# Patient Record
Sex: Female | Born: 1945 | Race: White | Hispanic: No | Marital: Married | Smoking: Never smoker
Health system: Southern US, Community
[De-identification: ages and names within clinical notes are randomized; demographics above are authoritative.]

## PROBLEM LIST (undated history)

## (undated) DIAGNOSIS — Z973 Presence of spectacles and contact lenses: Secondary | ICD-10-CM

## (undated) DIAGNOSIS — H9190 Unspecified hearing loss, unspecified ear: Secondary | ICD-10-CM

## (undated) DIAGNOSIS — M1990 Unspecified osteoarthritis, unspecified site: Secondary | ICD-10-CM

## (undated) DIAGNOSIS — M8580 Other specified disorders of bone density and structure, unspecified site: Secondary | ICD-10-CM

## (undated) DIAGNOSIS — Z974 Presence of external hearing-aid: Secondary | ICD-10-CM

## (undated) DIAGNOSIS — K219 Gastro-esophageal reflux disease without esophagitis: Secondary | ICD-10-CM

## (undated) DIAGNOSIS — E039 Hypothyroidism, unspecified: Secondary | ICD-10-CM

## (undated) DIAGNOSIS — Z8719 Personal history of other diseases of the digestive system: Secondary | ICD-10-CM

## (undated) DIAGNOSIS — C801 Malignant (primary) neoplasm, unspecified: Secondary | ICD-10-CM

## (undated) DIAGNOSIS — E785 Hyperlipidemia, unspecified: Secondary | ICD-10-CM

## (undated) DIAGNOSIS — Z780 Asymptomatic menopausal state: Secondary | ICD-10-CM

## (undated) DIAGNOSIS — J189 Pneumonia, unspecified organism: Secondary | ICD-10-CM

## (undated) DIAGNOSIS — T7840XA Allergy, unspecified, initial encounter: Secondary | ICD-10-CM

## (undated) DIAGNOSIS — N814 Uterovaginal prolapse, unspecified: Secondary | ICD-10-CM

## (undated) DIAGNOSIS — T148XXA Other injury of unspecified body region, initial encounter: Secondary | ICD-10-CM

## (undated) HISTORY — DX: Uterovaginal prolapse, unspecified: N81.4

## (undated) HISTORY — DX: Allergy, unspecified, initial encounter: T78.40XA

## (undated) HISTORY — PX: CATARACT EXTRACTION: SUR2

## (undated) HISTORY — DX: Unspecified osteoarthritis, unspecified site: M19.90

## (undated) HISTORY — DX: Malignant (primary) neoplasm, unspecified: C80.1

## (undated) HISTORY — DX: Hyperlipidemia, unspecified: E78.5

## (undated) HISTORY — PX: HEMORRHOID SURGERY: SHX153

---

## 1998-11-24 ENCOUNTER — Other Ambulatory Visit: Admission: RE | Admit: 1998-11-24 | Discharge: 1998-11-24 | Payer: Self-pay | Admitting: Obstetrics & Gynecology

## 2000-02-07 ENCOUNTER — Other Ambulatory Visit: Admission: RE | Admit: 2000-02-07 | Discharge: 2000-02-07 | Payer: Self-pay | Admitting: Obstetrics and Gynecology

## 2000-02-10 ENCOUNTER — Ambulatory Visit (HOSPITAL_COMMUNITY): Admission: RE | Admit: 2000-02-10 | Discharge: 2000-02-10 | Payer: Self-pay | Admitting: Obstetrics and Gynecology

## 2000-02-10 ENCOUNTER — Encounter: Payer: Self-pay | Admitting: Obstetrics and Gynecology

## 2001-05-03 ENCOUNTER — Other Ambulatory Visit: Admission: RE | Admit: 2001-05-03 | Discharge: 2001-05-03 | Payer: Self-pay | Admitting: Obstetrics and Gynecology

## 2002-03-12 ENCOUNTER — Ambulatory Visit (HOSPITAL_COMMUNITY): Admission: RE | Admit: 2002-03-12 | Discharge: 2002-03-12 | Payer: Self-pay | Admitting: Family Medicine

## 2002-11-29 ENCOUNTER — Other Ambulatory Visit: Admission: RE | Admit: 2002-11-29 | Discharge: 2002-11-29 | Payer: Self-pay | Admitting: Obstetrics and Gynecology

## 2005-06-29 ENCOUNTER — Other Ambulatory Visit: Admission: RE | Admit: 2005-06-29 | Discharge: 2005-06-29 | Payer: Self-pay | Admitting: Obstetrics and Gynecology

## 2010-01-07 ENCOUNTER — Encounter (INDEPENDENT_AMBULATORY_CARE_PROVIDER_SITE_OTHER): Payer: Self-pay | Admitting: *Deleted

## 2010-01-20 ENCOUNTER — Telehealth: Payer: Self-pay | Admitting: Internal Medicine

## 2010-01-21 ENCOUNTER — Ambulatory Visit: Payer: Self-pay | Admitting: Internal Medicine

## 2010-01-21 DIAGNOSIS — M2550 Pain in unspecified joint: Secondary | ICD-10-CM

## 2010-01-21 DIAGNOSIS — E785 Hyperlipidemia, unspecified: Secondary | ICD-10-CM | POA: Insufficient documentation

## 2010-01-21 DIAGNOSIS — Z85828 Personal history of other malignant neoplasm of skin: Secondary | ICD-10-CM

## 2010-01-21 DIAGNOSIS — K5730 Diverticulosis of large intestine without perforation or abscess without bleeding: Secondary | ICD-10-CM

## 2010-01-21 DIAGNOSIS — M4716 Other spondylosis with myelopathy, lumbar region: Secondary | ICD-10-CM

## 2010-01-29 ENCOUNTER — Ambulatory Visit: Payer: Self-pay | Admitting: Internal Medicine

## 2010-02-22 ENCOUNTER — Ambulatory Visit: Payer: Self-pay | Admitting: Internal Medicine

## 2010-02-22 DIAGNOSIS — G4710 Hypersomnia, unspecified: Secondary | ICD-10-CM | POA: Insufficient documentation

## 2010-02-22 LAB — CONVERTED CEMR LAB
Cholesterol, target level: 200 mg/dL
HDL goal, serum: 40 mg/dL
LDL Goal: 160 mg/dL

## 2010-04-13 ENCOUNTER — Telehealth (INDEPENDENT_AMBULATORY_CARE_PROVIDER_SITE_OTHER): Payer: Self-pay | Admitting: *Deleted

## 2010-05-11 ENCOUNTER — Telehealth: Payer: Self-pay | Admitting: Internal Medicine

## 2010-05-20 ENCOUNTER — Ambulatory Visit: Payer: Self-pay | Admitting: Internal Medicine

## 2010-05-21 LAB — CONVERTED CEMR LAB
ALT: 20 units/L (ref 0–35)
AST: 22 units/L (ref 0–37)
Albumin: 4 g/dL (ref 3.5–5.2)
Alkaline Phosphatase: 49 units/L (ref 39–117)
Bilirubin, Direct: 0.1 mg/dL (ref 0.0–0.3)
Cholesterol: 190 mg/dL (ref 0–200)
HDL: 48.7 mg/dL (ref >39.00)
LDL Cholesterol: 112 mg/dL — ABNORMAL HIGH (ref 0–99)
Total Bilirubin: 0.5 mg/dL (ref 0.3–1.2)
Total CHOL/HDL Ratio: 4
Total CK: 86 units/L (ref 7–177)
Total Protein: 6.5 g/dL (ref 6.0–8.3)
Triglycerides: 147 mg/dL (ref 0.0–149.0)
VLDL: 29.4 mg/dL (ref 0.0–40.0)

## 2010-05-26 ENCOUNTER — Ambulatory Visit: Payer: Self-pay | Admitting: Internal Medicine

## 2010-05-26 DIAGNOSIS — M8580 Other specified disorders of bone density and structure, unspecified site: Secondary | ICD-10-CM

## 2010-05-26 DIAGNOSIS — IMO0001 Reserved for inherently not codable concepts without codable children: Secondary | ICD-10-CM

## 2010-05-28 ENCOUNTER — Encounter: Payer: Self-pay | Admitting: Internal Medicine

## 2010-05-28 LAB — CONVERTED CEMR LAB: Vit D, 25-Hydroxy: 38 ng/mL (ref 30–89)

## 2010-09-05 HISTORY — PX: COLONOSCOPY: SHX174

## 2010-09-10 ENCOUNTER — Other Ambulatory Visit: Payer: Self-pay | Admitting: Internal Medicine

## 2010-09-10 ENCOUNTER — Ambulatory Visit
Admission: RE | Admit: 2010-09-10 | Discharge: 2010-09-10 | Payer: Self-pay | Source: Home / Self Care | Attending: Internal Medicine | Admitting: Internal Medicine

## 2010-09-10 LAB — LIPID PANEL
Cholesterol: 195 mg/dL (ref 0–200)
HDL: 56 mg/dL (ref >39.00)
LDL Cholesterol: 127 mg/dL — ABNORMAL HIGH (ref 0–99)
Total CHOL/HDL Ratio: 3
Triglycerides: 62 mg/dL (ref 0.0–149.0)
VLDL: 12.4 mg/dL (ref 0.0–40.0)

## 2010-09-10 LAB — HEPATIC FUNCTION PANEL
ALT: 20 U/L (ref 0–35)
AST: 22 U/L (ref 0–37)
Albumin: 3.7 g/dL (ref 3.5–5.2)
Alkaline Phosphatase: 49 U/L (ref 39–117)
Bilirubin, Direct: 0.1 mg/dL (ref 0.0–0.3)
Total Bilirubin: 0.8 mg/dL (ref 0.3–1.2)
Total Protein: 6.3 g/dL (ref 6.0–8.3)

## 2010-09-10 LAB — CK: Total CK: 85 U/L (ref 7–177)

## 2010-10-03 LAB — CONVERTED CEMR LAB
ALT: 15 units/L (ref 0–35)
AST: 20 units/L (ref 0–37)
Albumin: 4.2 g/dL (ref 3.5–5.2)
Alkaline Phosphatase: 55 units/L (ref 39–117)
BUN: 19 mg/dL (ref 6–23)
Basophils Absolute: 0 10*3/uL (ref 0.0–0.1)
Basophils Absolute: 0 10*3/uL (ref 0.0–0.1)
Basophils Relative: 0.6 % (ref 0.0–3.0)
Basophils Relative: 0.7 % (ref 0.0–3.0)
Bilirubin, Direct: 0.1 mg/dL (ref 0.0–0.3)
CO2: 30 meq/L (ref 19–32)
Calcium: 9.2 mg/dL (ref 8.4–10.5)
Chloride: 104 meq/L (ref 96–112)
Cholesterol: 215 mg/dL — ABNORMAL HIGH (ref 0–200)
Creatinine, Ser: 0.6 mg/dL (ref 0.4–1.2)
Direct LDL: 152.3 mg/dL
Eosinophils Absolute: 0.1 10*3/uL (ref 0.0–0.7)
Eosinophils Absolute: 0.2 10*3/uL (ref 0.0–0.7)
Eosinophils Relative: 2.3 % (ref 0.0–5.0)
Eosinophils Relative: 4 % (ref 0.0–5.0)
GFR calc non Af Amer: 108.71 mL/min (ref >60)
Glucose, Bld: 95 mg/dL (ref 70–99)
HCT: 37.3 % (ref 36.0–46.0)
HCT: 37.9 % (ref 36.0–46.0)
HDL: 56.4 mg/dL (ref >39.00)
Hemoglobin: 12.8 g/dL (ref 12.0–15.0)
Hemoglobin: 12.8 g/dL (ref 12.0–15.0)
Lymphocytes Relative: 32.4 % (ref 12.0–46.0)
Lymphocytes Relative: 69.6 % — ABNORMAL HIGH (ref 12.0–46.0)
Lymphs Abs: 1.3 10*3/uL (ref 0.7–4.0)
Lymphs Abs: 1.6 10*3/uL (ref 0.7–4.0)
MCHC: 33.7 g/dL (ref 30.0–36.0)
MCHC: 34.2 g/dL (ref 30.0–36.0)
MCV: 92.1 fL (ref 78.0–100.0)
MCV: 92.4 fL (ref 78.0–100.0)
Monocytes Absolute: 0.2 10*3/uL (ref 0.1–1.0)
Monocytes Absolute: 0.3 10*3/uL (ref 0.1–1.0)
Monocytes Relative: 7.1 % (ref 3.0–12.0)
Monocytes Relative: 7.2 % (ref 3.0–12.0)
Neutro Abs: 0.5 10*3/uL — ABNORMAL LOW (ref 1.4–7.7)
Neutro Abs: 2.3 10*3/uL (ref 1.4–7.7)
Neutrophils Relative %: 20.2 % — ABNORMAL LOW (ref 43.0–77.0)
Neutrophils Relative %: 55.9 % (ref 43.0–77.0)
Platelets: 207 10*3/uL (ref 150.0–400.0)
Platelets: 215 10*3/uL (ref 150.0–400.0)
Potassium: 4.5 meq/L (ref 3.5–5.1)
RBC: 4.05 M/uL (ref 3.87–5.11)
RBC: 4.11 M/uL (ref 3.87–5.11)
RDW: 13.4 % (ref 11.5–14.6)
RDW: 13.6 % (ref 11.5–14.6)
Rheumatoid fact SerPl-aCnc: 21.3 intl units/mL — ABNORMAL HIGH (ref 0.0–20.0)
Sed Rate: 9 mm/hr (ref 0–22)
Sodium: 141 meq/L (ref 135–145)
TSH: 2.1 microintl units/mL (ref 0.35–5.50)
Total Bilirubin: 0.7 mg/dL (ref 0.3–1.2)
Total CHOL/HDL Ratio: 4
Total Protein: 6.9 g/dL (ref 6.0–8.3)
Triglycerides: 146 mg/dL (ref 0.0–149.0)
VLDL: 29.2 mg/dL (ref 0.0–40.0)
WBC: 2.3 10*3/uL — ABNORMAL LOW (ref 4.5–10.5)
WBC: 4.1 10*3/uL — ABNORMAL LOW (ref 4.5–10.5)

## 2010-10-04 ENCOUNTER — Ambulatory Visit
Admission: RE | Admit: 2010-10-04 | Discharge: 2010-10-04 | Payer: Self-pay | Source: Home / Self Care | Attending: Internal Medicine | Admitting: Internal Medicine

## 2010-10-05 NOTE — Letter (Signed)
Summary: New Patient Letter  [facility]  [address] [place], [place] [address]   Phone: [phone]  Fax: [phone]       01/07/2010 MRN: [id number]  [facility] [address] [place], [place]  [address]  Dear Ms. Berk,   Welcome to [facility] and thank you for choosing [place] as your Primary Care Providers. Enclosed you will find information about our practice that we hope you find helpful. We have also enclosed forms to be filled out prior to your visit. This will provide [place] with the necessary information and facilitate your being seen in a timely manner. If you have any questions, please call us at: [phone] and we will be happy to assist you. We look forward to seeing you at your scheduled appointment time.  Appointment Thursday, Jan 21, 2010 at 1:00pm  with Dr. Marga Melnick   Sincerely,  Primary Health Care Team  Please arrive 15 minutes early for your first appointment and bring your insurance card. Co-pay is required at the time of your visit.  *****Please call the office if you are not able to keep this appointment. There is a charge of $50.00 if any appointment is not cancelled or rescheduled within 24 hours*****

## 2010-10-05 NOTE — Assessment & Plan Note (Signed)
Summary: NEW TO EST,CHOLESTEROL,MEDICARE,OK PER Audrey [name]/RH.....   Vital Signs:  Patient profile:   65 year old female Height:      63.5 inches Weight:      146.8 pounds BMI:     25.69 Pulse rate:   56 / minute Resp:     14 per minute BP sitting:   100 / 80  (left arm) Cuff size:   large  Vitals Entered By: Kandice Hams (Jan 21, 2010 1:13 PM) CC: New patient establish: Arthritis Comments       CC:  New patient establish: Arthritis.  History of Present Illness: Audrey [name] is here to establish as a new patient; she has  almost constant, diffuse joint pain for 3 years. Previous Rx has been  Diclofenac with some response. She has never had a physical; she has gone on Medicare as of 12/2009. IAPP criteria reviewed ; all issues addressed  & up to date except for colonoscopy & Health Care POA designation. No environmental or social/ familial risks identified. No physical limitations except related to  joint symptoms.  Preventive Screening-Counseling & Management  Alcohol-Tobacco     Smoking Status: never  Caffeine-Diet-Exercise     Does Patient Exercise: no  Allergies (verified): 1)  ! Celebrex 2)  ! Sulfa  Past History:  Past Medical History: Arthralgias Hyperlipidemia Excess somnulence , controlled  Diverticulosis, colon Skin cancer, hx of, Basal Cell   Past Surgical History: G 2 P 2  Hemorrhoidectomy Colonoscopy , Dr Jarold Motto  Family History: Father: CAD, COPD Mother: DAD, Macular  Degeneration Siblings: bro: CAD , S/P CBAG No FH  RA, SLE, scleroderma  Social History: Retired Married Never Smoked Alcohol use-yes: rare Regular exercise-no Smoking Status:  never Does Patient Exercise:  no  Review of Systems  The patient denies anorexia, fever, weight loss, weight gain, vision loss, decreased hearing, hoarseness, chest pain, syncope, dyspnea on exertion, peripheral edema, prolonged cough, headaches, hemoptysis, abdominal pain, melena, hematochezia, severe  indigestion/heartburn, unusual weight change, abnormal bleeding, enlarged lymph nodes, and angioedema.   GU:  Denies discharge, dysuria, hematuria, and incontinence. MS:  Complains of stiffness; denies joint redness, joint swelling, and muscle weakness; Burning pain in shoulders, hands, LS area, hips, knees & ankles . Stiffness esp in am. Derm:  Denies changes in nail beds, dryness, hair loss, lesion(s), and rash. Neuro:  Denies brief paralysis, disturbances in coordination, memory loss, numbness, poor balance, tingling, and weakness; Some LLE radicular pain post sitting or reclining. Psych:  Denies anxiety, depression, easily angered, easily tearful, irritability, and panic attacks.  Physical Exam  General:  well-nourished,in no acute distress; alert,appropriate and cooperative throughout examination Head:  Normocephalic and atraumatic without obvious abnormalities. Eyes:  No corneal or conjunctival inflammation noted.Perrla. Funduscopic exam benign, without hemorrhages, exudates or papilledema. Ears:  External ear exam shows no significant lesions or deformities.  Otoscopic examination reveals clear canals, tympanic membranes are intact bilaterally without bulging, retraction, inflammation or discharge. Hearing is grossly normal bilaterally. Nose:  External nasal examination shows no deformity or inflammation. Nasal mucosa are pink and moist without lesions or exudates. Mouth:  Oral mucosa and oropharynx without lesions or exudates.  Teeth in good repair. Neck:  No deformities, masses, or tenderness noted. Breasts:  Dr Senaida Ores  Lungs:  Normal respiratory effort, chest expands symmetrically. Lungs are clear to auscultation, no crackles or wheezes. Heart:  Normal rate and regular rhythm. S1 and S2 normal without gallop, murmur, click, rub or other extra sounds. Abdomen:  Bowel sounds positive,abdomen soft  and non-tender without masses, organomegaly or hernias noted. Aorta palpable w/o  AAA Genitalia:  Dr Senaida Ores Msk:  No deformity or scoliosis noted of thoracic or lumbar spine.  Sat up w/o help Pulses:  R and L carotid,radial,dorsalis pedis and posterior tibial pulses are full and equal bilaterally Extremities:  No clubbing, cyanosis, edema, or deformity noted with normal full range of motion of all joints.   Surprisingly neg exam. Neg SLR . Neurologic:  alert & oriented X3, strength normal in all extremities, gait normal, and DTRs symmetrical and normal.   Skin:  Intact without suspicious lesions or rashes Cervical Nodes:  No lymphadenopathy noted Axillary Nodes:  No palpable lymphadenopathy Psych:  memory intact for recent and remote, normally interactive, good eye contact, not anxious appearing, and not depressed appearing.     Impression & Recommendations:  Problem # 1:  ROUTINE GENERAL MEDICAL EXAM@HEALTH  CARE FACL (ICD-V70.0) "IAPP" Orders: EKG w/ Interpretation (93000) Venipuncture (84132) TLB-Lipid Panel (80061-LIPID) TLB-BMP (Basic Metabolic Panel-BMET) (80048-METABOL) TLB-CBC Platelet - w/Differential (85025-CBCD) TLB-Hepatic/Liver Function Pnl (80076-HEPATIC) TLB-TSH (Thyroid Stimulating Hormone) (84443-TSH)  Problem # 2:  POLYARTHRITIS (ICD-719.49)  Orders: TLB-Rheumatoid Factor (RA) (44010-UV) TLB-Sedimentation Rate (ESR) (85652-ESR)  Problem # 3:  HYPERLIPIDEMIA (ICD-272.4)  Her updated medication list for this problem includes:    Simvastatin 10 Mg Tabs (Simvastatin) .Marland Kitchen... 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 2 by mouth once daily as needed  Complete Medication List: 1)  Fluoxetine Hcl 20 Mg Caps (Fluoxetine hcl) .Marland Kitchen.. 1 by mouth once daily 2)  Diclofenac Sodium 75 Mg Tbec (Diclofenac sodium) .... 2 by mouth once daily as  needed 3)  Simvastatin 10 Mg Tabs (Simvastatin) .Marland Kitchen.. 1 by mouth at bedtime 4)  Calcium 1500mg   .... Daily 5)  Icaps Caps (Multiple vitamins-minerals) .Marland Kitchen.. 1 by mouth once daily  Patient Instructions: 1)  Schedule a colonoscopy  to help detect colon cancer.Choose your Health care Power of Attorney and/or prepare a Living Will.

## 2010-10-05 NOTE — Assessment & Plan Note (Signed)
Summary: followup on labs//kn   Vital Signs:  Patient profile:   65 year old female Weight:      149.2 pounds Pulse rate:   64 / minute Resp:     16 per minute BP sitting:   104 / 68  (left arm) Cuff size:   regular  Vitals Entered By: Shonna Chock (February 22, 2010 3:07 PM) CC: Follow-up visit: Discuss Labs and refill Fluoxetine, Lipid Management Comments REVIEWED MED LIST, PATIENT AGREED DOSE AND INSTRUCTION CORRECT    CC:  Follow-up visit: Discuss Labs and refill Fluoxetine and Lipid Management.  History of Present Illness: Fluoxetine has controlled the hypersomnulence present for almost 20 years.The Rx costs ? $10 for 3 months' supply.  Lipid Management History:      Positive NCEP/ATP III risk factors include female age 80 years old or older.  Negative NCEP/ATP III risk factors include non-diabetic, no family history for ischemic heart disease, non-tobacco-user status, non-hypertensive, no ASHD (atherosclerotic heart disease), no prior stroke/TIA, no peripheral vascular disease, and no history of aortic aneurysm.     Allergies: 1)  ! Celebrex 2)  ! Sulfa  Past History:  Past Medical History: Arthralgias Hyperlipidemia: NMR Lipoprofile 2011: 99(1706/989), HDL 55, TG 91. LDL goal = <80, ideally < 70.. Framingham Study LDL goal = < 160. Excess somnulence , controlled  Diverticulosis, colon Skin cancer, hx of, Basal Cell   Review of Systems General:  Complains of fatigue; denies sleep disorder. Derm:  Complains of rash; Rash R elbow in Summer. Psych:  Denies anxiety, depression, easily angered, easily tearful, irritability, and panic attacks.  Physical Exam  General:  well-nourished; alert,appropriate and cooperative throughout examination Lungs:  Normal respiratory effort, chest expands symmetrically. Lungs are clear to auscultation, no crackles or wheezes. Heart:  Normal rate and regular rhythm. S1 and S2 normal without gallop, murmur, click, rub.S4 Pulses:  R and L  carotid,radial,dorsalis pedis and posterior tibial pulses are full and equal bilaterally Skin:  Faint splotchy rash R elbow Psych:  memory intact for recent and remote, normally interactive, good eye contact, not anxious appearing, and not depressed appearing.     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 1 by mouth once daily 2)  Diclofenac Sodium 75 Mg Tbec (Diclofenac sodium) .... 2 by mouth once daily as needed 3)  Simvastatin 20 Mg Tabs (simvastatin)  .Marland Kitchen.. 1 by mouth at bedtime 4)  Calcium 1500mg   .... Daily 5)  Icaps Caps (Multiple vitamins-minerals) .Marland Kitchen.. 1 by mouth once daily  Lipid Assessment/Plan:      Based on NCEP/ATP III, the patient's risk factor category is "0-1 risk factors".  The patient's lipid goals are as follows: Total cholesterol goal is 200; LDL cholesterol goal is 160; HDL cholesterol goal is 40; Triglyceride goal is 150.    Patient Instructions: 1)  Cort Aid two times a day to rash.Please schedule a follow-up appointment in 1 year or as needed. Prescriptions: SIMVASTATIN 20 MG TABS (SIMVASTATIN) 1 by mouth at bedtime  #90 x 3   Entered and Authorized by:   Marga Melnick MD   Signed by:   Marga Melnick MD on 02/22/2010   Method used:   Print then Give to Patient   RxID:   [id number] FLUOXETINE HCL 20 MG CAPS (FLUOXETINE HCL) 1 by  mouth once daily  #90 x 3   Entered and Authorized by:   Marga Melnick MD   Signed by:   Marga Melnick MD on 02/22/2010   Method used:   Print then Give to Patient   RxID:   [id number]

## 2010-10-05 NOTE — Progress Notes (Signed)
Summary: REFILL REQUEST  Phone Note Refill Request Call back at [phone] Message from:  Pharmacy on April 13, 2010 10:30 AM  Refills Requested: Medication #1:  SIMVASTATIN 20 MG TABS (SIMVASTATIN) 1 by mouth at bedtime   Dosage confirmed as above?Dosage Confirmed   Supply Requested: 3 months [facility]  Next Appointment Scheduled: NONE Initial call taken by: Lavell Islam,  April 13, 2010 10:30 AM    Prescriptions: SIMVASTATIN 20 MG TABS (SIMVASTATIN) 1 by mouth at bedtime  #90 x 3   Entered by:   Shonna Chock CMA   Authorized by:   Marga Melnick MD   Signed by:   Shonna Chock CMA on 04/13/2010   Method used:   Faxed to ...       [facility] (mail-order)             , [place]         Ph: [phone]       Fax: [phone]   RxID:   [id number]

## 2010-10-05 NOTE — Assessment & Plan Note (Signed)
Summary: FOLLOWUP ON LABS/KN   Vital Signs:  Patient profile:   65 year old female Weight:      149.2 pounds Temp:     98.5 degrees F oral Pulse rate:   64 / minute BP sitting:   110 / 70  (left arm) Cuff size:   regular  Vitals Entered By: Shonna Chock CMA (May 26, 2010 9:21 AM)  History of Present Illness: Hyperlipidemia Follow-Up      This is a 65 year old woman who presents for Hyperlipidemia follow-up.  The patient reports muscle aches  in U & LE bilaterally which she relates to Simvastatin. Her CPK was normal (86).She also has pain in RLE > LLE when driving especially. Data concerning statin induced myalgia was reviewed as was NMR Lipoprofile. She  denies GI upset, abdominal pain, flushing, itching, constipation, diarrhea, and fatigue.  The patient denies the following symptoms: chest pain/pressure, exercise intolerance, dypsnea, palpitations, and pedal edema.  Compliance with medications (by patient report) has been near 100%.  Dietary compliance has been fair.  The patient reports no exercise.  Her father had MI @ 81 & her mother @ 78.Her brother had CBAG @ 65.  Current Medications (verified): 1)  Fluoxetine Hcl 20 Mg Caps (Fluoxetine Hcl) .Marland Kitchen.. 1 By Mouth Once Daily 2)  Diclofenac Sodium 75 Mg Tbec (Diclofenac Sodium) .... 2 By Mouth Once Daily As Needed 3)  Simvastatin 20 Mg Tabs (Simvastatin) .Marland Kitchen.. 1 By Mouth At Bedtime 4)  Calcium 1500mg  .... Daily 5)  Icaps  Caps (Multiple Vitamins-Minerals) .Marland Kitchen.. 1 By Mouth Once Daily  Allergies: 1)  ! Celebrex 2)  ! Sulfa  Past History:  Past Medical History: Arthralgias Hyperlipidemia: NMR Lipoprofile 2011: 99(1706/989), HDL 55, TG 91. LDL goal = <80, ideally < 70. Framingham Study LDL goal = < 160. Excess somnulence , controlled  Diverticulosis, colon Skin cancer, hx of, Basal Cell  Osteopenia, PMH of ,S/P Fosamax  Review of Systems Neuro:  Denies brief paralysis, numbness, tingling, and weakness.  Physical  Exam  General:  in no acute distress; alert,appropriate and cooperative throughout examination Lungs:  Normal respiratory effort, chest expands symmetrically. Lungs are clear to auscultation, no crackles or wheezes. Heart:  Normal rate and regular rhythm. S1 and S2 normal without gallop, murmur, click, rub . Soft S4 with slurring Msk:  Lay down & sat up w/o help. Neg SLR Pulses:  R and L carotid,radial,dorsalis pedis and posterior tibial pulses are full and equal bilaterally Extremities:  No clubbing, cyanosis, edema.  Neurologic:  alert & oriented X3, strength normal in all extremities, and DTRs symmetrical and normal.   Skin:  Intact without suspicious lesions or rashes Psych:  memory intact for recent and remote, normally interactive, and good eye contact.     Impression & Recommendations:  Problem # 1:  MUSCLE PAIN (ICD-729.1) CPK normal Her updated medication list for this problem includes:    Diclofenac Sodium 75 Mg Tbec (Diclofenac sodium) .Marland Kitchen... 2 by mouth once daily as needed  Problem # 2:  OSTEOPENIA (ICD-733.90)  PMH of  Orders: T-Vitamin D (25-Hydroxy) ([address]38-75643) Misc. Referral (Misc. 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 2 by mouth once daily as needed  Problem # 4:  HYPERLIPIDEMIA (ICD-272.4)  Her updated medication list for this problem includes:    Pravastatin Sodium 40 Mg Tabs (Pravastatin sodium) .Marland KitchenMarland KitchenMarland KitchenMarland Kitchen  1 at bedtime  Complete Medication List: 1)  Fluoxetine Hcl 20 Mg Caps (Fluoxetine hcl) .Marland Kitchen.. 1 by mouth once daily 2)  Diclofenac Sodium 75 Mg Tbec (Diclofenac sodium) .... 2 by mouth once daily as needed 3)  Calcium 1500mg   .... Daily 4)  Icaps Caps (Multiple vitamins-minerals) .Marland Kitchen.. 1 by mouth once daily 5)  Pravastatin Sodium 40 Mg Tabs (Pravastatin sodium) .Marland Kitchen.. 1 at bedtime  Patient Instructions: 1)   Stretching exercises recommended for back & leg pain.Fasting labs in 10 weeks if Pravastatin taken: 2)  Hepatic Panel ; CPK : 995.20,729.1 3)  Lipid Panel :272.4 Prescriptions: PRAVASTATIN SODIUM 40 MG TABS (PRAVASTATIN SODIUM) 1 at bedtime  #90 x 0   Entered and Authorized by:   Marga Melnick MD   Signed by:   Marga Melnick MD on 05/26/2010   Method used:   Historical   RxID:   [id number]   Appended Document: FOLLOWUP ON LABS/KN Flu Vaccine Consent Questions     Do you have a history of severe allergic reactions to this vaccine? no    Any prior history of allergic reactions to egg and/or gelatin? no    Do you have a sensitivity to the preservative Thimersol? no    Do you have a past history of Guillan-Barre Syndrome? no    Do you currently have an acute febrile illness? no    Have you ever had a severe reaction to latex? no    Vaccine information given and explained to patient? yes    Are you currently pregnant? no    Lot Number:[id number]   Exp Date:03/05/2011   Site Given  Right Deltoid IM

## 2010-10-05 NOTE — Progress Notes (Signed)
Summary: doctor switch request  Phone Note Call from Patient Call back at Home Phone [phone]   Caller: Patient Call For: Dr. Marina Goodell Reason for Call: Talk to Nurse Summary of Call: pt would like to switch to Dr. Jarold Motto because Dr. Jarold Motto uses the prep that she likes Initial call taken by: Vallarie Mare,  Jan 20, 2010 12:51 PM  Follow-up for Phone Call        Pt. thought Dr.Basma Buchner still did' the gallon jug" prep.Informed of the current prep as she got her recall letter  so she will call back to schedule. Follow-up by: Teryl Lucy RN,  Jan 21, 2010 4:04 PM

## 2010-10-05 NOTE — Progress Notes (Signed)
Summary: Triage: Medication Concerns  Phone Note Call from Patient   Caller: Patient Summary of Call: PT STATES THAT SHE IS ON SIMVASTATIN AND SHE BELIEVES IT IS CAUSING HER BODY TO ACHE AFTER RESEARCHING THE MEDICINE. cAN THIS BE CHANGED? PLEASE ADVISE [phone] Initial call taken by: Lavell Islam,  May 11, 2010 8:39 AM  Follow-up for Phone Call        Dr.Hopper please advise  Follow-up by: Shonna Chock CMA,  May 11, 2010 10:01 AM  Additional Follow-up for Phone Call Additional follow up Details #1::        Please continue med long enough to get lab tests :vit D level, fasting lipids, hepatic panel & CPK ( 272.4, 995.20, 729.1)  labs ASAP; F/U appt 2-3 days later to discuss risks & options Additional Follow-up by: Marga Melnick MD,  May 11, 2010 5:04 PM    Additional Follow-up for Phone Call Additional follow up Details #2::    pt aware, labs schedule...........Marland KitchenFelecia Deloach CMA  May 11, 2010 5:15 PM

## 2010-10-08 ENCOUNTER — Ambulatory Visit (INDEPENDENT_AMBULATORY_CARE_PROVIDER_SITE_OTHER)
Admission: RE | Admit: 2010-10-08 | Discharge: 2010-10-08 | Disposition: A | Discharge status: Home / Self Care | Payer: Self-pay | Source: Ambulatory Visit | Attending: Internal Medicine | Admitting: Internal Medicine

## 2010-10-08 ENCOUNTER — Other Ambulatory Visit: Payer: Self-pay | Admitting: Internal Medicine

## 2010-10-08 DIAGNOSIS — M549 Dorsalgia, unspecified: Secondary | ICD-10-CM

## 2010-10-13 NOTE — Assessment & Plan Note (Signed)
Summary: followup on labs/kn   Vital Signs:  Patient profile:   66 year old female Weight:      151.2 pounds BMI:     26.46 Pulse rate:   64 / minute Resp:     14 per minute BP sitting:   110 / 72  (left arm) Cuff size:   regular  Vitals Entered By: Shonna Chock CMA (October 04, 2010 9:23 AM) CC: Back pain   CC:  Back pain.  History of Present Illness: Hyperlipidemia Follow-Up      This is a 65 year old woman who presents for Hyperlipidemia follow-up.  Labs reviewed & risks discussed. The patient denies muscle aches ( see CPK  value &  Back Pain assessment ), GI upset, abdominal pain, flushing, itching, constipation, diarrhea, and fatigue.  The patient denies the following symptoms: chest pain/pressure, exercise intolerance, dypsnea, palpitations, syncope, and pedal edema.  Compliance with medications (by patient report) has been near 100%.  Dietary compliance has been fair.  The patient reports no exercise.   Back Pain      The patient also presents with Back pain intermittenty for 5-6  years.  The patient denies fever, chills, weakness, loss of sensation, fecal incontinence, urinary incontinence, and urinary retention.  The pain is located in the mid low back.  The pain  is exacerbated by standing, driving &  lifting.  The pain radiates to the right leg and left leg below the knee.  The pain is  also made worse by lying down ( "my legs ache"). and flexion.   Diclofenac helps.  Current Medications (verified): 1)  Fluoxetine Hcl 20 Mg Caps (Fluoxetine Hcl) .Marland Kitchen.. 1 By Mouth Once Daily 2)  Diclofenac Sodium 75 Mg Tbec (Diclofenac Sodium) .... 2 By Mouth Once Daily As Needed 3)  Calcium 1500mg  .... Daily 4)  Icaps  Caps (Multiple Vitamins-Minerals) .Marland Kitchen.. 1 By Mouth Once Daily 5)  Pravastatin Sodium 40 Mg Tabs (Pravastatin Sodium) .Marland Kitchen.. 1 At Bedtime  Allergies: 1)  ! Celebrex 2)  ! Sulfa  Physical Exam  General:  well-nourished,in no acute distress; alert,appropriate and cooperative  throughout examination Lungs:  Normal respiratory effort, chest expands symmetrically. Lungs are clear to auscultation, no crackles or wheezes. Heart:  Normal rate and regular rhythm. S1 and S2 normal without gallop, murmur, click, rub . Abdomen:  Bowel sounds positive,abdomen soft and non-tender without masses, organomegaly or hernias noted. No AAA or bruits Msk:  No deformity or scoliosis noted of thoracic or lumbar spine.   She lay down & sat up w/o help Pulses:  R and L carotid,radial,dorsalis pedis and posterior tibial pulses are full and equal bilaterally Extremities:  No clubbing, cyanosis, edema, or deformity noted with normal full range of motion of all joints.  Minor crepitus of knees.  Neurologic:  alert & oriented X3, strength normal in all extremities, heel / toe  gait normal, and DTRs symmetrical and normal.   Skin:  Intact without suspicious lesions or rashes Psych:  memory intact for recent and remote, normally interactive, and good eye contact.     Impression & Recommendations:  Problem # 1:  HYPERLIPIDEMIA (ICD-272.4)  Her updated medication list for this problem includes:    Pravastatin Sodium 40 Mg Tabs (Pravastatin sodium) .Marland Kitchen... 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 2 by mouth once daily as needed  Orders: Orthopedic Referral (Ortho) Prescription Created Electronically [phone]) T-Lumbar Spine w/Flex & Ext 4 Views (72120TC)  Complete Medication List: 1)  Fluoxetine Hcl 20 Mg Caps (Fluoxetine hcl) .Marland Kitchen.. 1 by mouth once daily 2)  Diclofenac Sodium 75 Mg Tbec (Diclofenac sodium) .... 2 by mouth once daily as needed 3)  Calcium 1500mg   .... Daily 4)  Icaps Caps (Multiple vitamins-minerals) .Marland Kitchen.. 1 by mouth once daily 5)  Pravastatin Sodium 40  Mg Tabs (Pravastatin sodium) .Marland Kitchen.. 1 at bedtime  Patient Instructions: 1)  Take Diclofenac as needed only & only with food. Prescriptions: DICLOFENAC SODIUM 75 MG TBEC (DICLOFENAC SODIUM) 2 by mouth once daily as needed  #60 x 1   Entered and Authorized by:   Marga Melnick MD   Signed by:   Marga Melnick MD on 10/04/2010   Method used:   Electronically to        [facility]       [address]       [place], [place]  [address]       Ph: [phone]       Fax: [phone]   RxID:   [id number] PRAVASTATIN SODIUM 40 MG TABS (PRAVASTATIN SODIUM) 1 at bedtime  #90 x 3   Entered and Authorized by:   Marga Melnick MD   Signed by:   Marga Melnick MD on 10/04/2010   Method used:   Electronically to        [facility]       [address]       [place], [place]  [address]       Ph: [phone]       Fax: [phone]   RxID:   [id number]    Orders Added: 1)  Est. Patient Level IV [60630] 2)  Orthopedic Referral [Ortho] 3)  Prescription Created Electronically [[id number]] 4)  T-Lumbar Spine w/Flex & Ext 4 Views [72120TC]

## 2011-03-24 ENCOUNTER — Ambulatory Visit (AMBULATORY_SURGERY_CENTER): Payer: Medicare Other | Admitting: *Deleted

## 2011-03-24 VITALS — Ht 64.0 in | Wt 148.0 lb

## 2011-03-24 DIAGNOSIS — Z1211 Encounter for screening for malignant neoplasm of colon: Secondary | ICD-10-CM

## 2011-03-24 MED ORDER — PEG-KCL-NACL-NASULF-NA ASC-C 100 G PO SOLR: ORAL | Status: DC

## 2011-03-29 ENCOUNTER — Telehealth: Payer: Self-pay | Admitting: *Deleted

## 2011-03-29 NOTE — Telephone Encounter (Signed)
Message copied by Sarina Ill ANN on Tue Mar 29, 2011  2:16 PM ------      Message from: Hilarie Fredrickson      Created: Mon Mar 28, 2011 11:40 AM      Regarding: RE: ? time for recall colon       Put her office (or [facility]) chart on my desk and I will review it. Thanks       jp       ----- Message -----         From: Wyona Almas, RN         Sent: 03/24/2011   1:35 PM           To: Yancey Flemings, MD      Subject: ? time for recall colon                                  [name] had a colon screening in 2004.  Hems and Tics were the findings and no family history of colon cancer.  She received a recall letter from you in 2009 but did not come in for another screening until today.  Do you want her to have her screening this year or wait until 2013?  Please advise.  Wyona Almas

## 2011-03-29 NOTE — Telephone Encounter (Signed)
Pt. Advised that per Dr. Marina Goodell she is to have her colon as scheduled 04/07/11.  Audrey [name]

## 2011-04-07 ENCOUNTER — Encounter: Payer: Self-pay | Admitting: Internal Medicine

## 2011-04-07 ENCOUNTER — Ambulatory Visit (AMBULATORY_SURGERY_CENTER): Payer: Medicare Other | Admitting: Internal Medicine

## 2011-04-07 VITALS — BP 108/52 | HR 66 | Temp 98.2°F | Resp 18

## 2011-04-07 DIAGNOSIS — K5730 Diverticulosis of large intestine without perforation or abscess without bleeding: Secondary | ICD-10-CM

## 2011-04-07 DIAGNOSIS — Z1211 Encounter for screening for malignant neoplasm of colon: Secondary | ICD-10-CM

## 2011-04-07 MED ORDER — SODIUM CHLORIDE 0.9 % IV SOLN: 500.0000 mL | INTRAVENOUS | Status: DC

## 2011-04-07 NOTE — Patient Instructions (Signed)
HANDOUTS GIVEN ON DIVERTICULOSIS AND HIGH FIBER DIET  DISCHARGE INSTRUCTIONS GIVEN PER BLUE AND GREEN SHEETS PER MD  REPEAT EXAM IN 10 YEARS. WE WILL SEND YOU A REMINDER LETTER THEN

## 2011-04-08 ENCOUNTER — Telehealth: Payer: Self-pay

## 2011-04-08 NOTE — Telephone Encounter (Signed)
Left message on the pt's am at her hm # which had an ID on the message. MAW

## 2011-04-16 ENCOUNTER — Other Ambulatory Visit: Payer: Self-pay | Admitting: Internal Medicine

## 2011-09-26 ENCOUNTER — Other Ambulatory Visit: Payer: Self-pay | Admitting: Internal Medicine

## 2011-09-26 NOTE — Telephone Encounter (Signed)
Patient needs to schedule CPX with fasting labs  

## 2011-10-09 ENCOUNTER — Other Ambulatory Visit: Payer: Self-pay | Admitting: Internal Medicine

## 2011-10-10 NOTE — Telephone Encounter (Signed)
Patient needs to schedule a CPX  

## 2012-02-15 ENCOUNTER — Other Ambulatory Visit: Payer: Self-pay | Admitting: Internal Medicine

## 2012-02-15 MED ORDER — PRAVASTATIN SODIUM 40 MG PO TABS: ORAL_TABLET | ORAL | Status: DC

## 2012-02-15 NOTE — Telephone Encounter (Signed)
15-DAY SUPPLY GIVEN, Last refills given in January 2013 had notation for patient to make OV appt prior to Rx refills & no future appt is scheduled as of yet/SLS *PATIENT OVERDUE FOR OFFICE VISIT-NO FURTHER MEDICATION WILL BE APPROVED WITHOUT PRIOR MD VISIT*

## 2012-03-10 ENCOUNTER — Other Ambulatory Visit: Payer: Self-pay | Admitting: Internal Medicine

## 2012-03-12 ENCOUNTER — Other Ambulatory Visit: Payer: Self-pay | Admitting: Internal Medicine

## 2012-03-12 MED ORDER — PRAVASTATIN SODIUM 40 MG PO TABS: ORAL_TABLET | ORAL | Status: DC

## 2012-03-12 NOTE — Telephone Encounter (Signed)
refill pravastatin 40mg  tab #90, take 1 tablet by mouth nightly at bedtime  Last fill 1.21.13  CPE scheduled for 9.4.13

## 2012-03-12 NOTE — Telephone Encounter (Signed)
Prozac was already responded to via electronic request

## 2012-03-12 NOTE — Telephone Encounter (Signed)
refill for prozac 20mg  #15, last fill 6.12.13, take one capsule by mouth one time daily

## 2012-03-27 NOTE — Telephone Encounter (Signed)
I called the pharmacy and rx was filled on 03/12/12, I called the patient and she states she picked up rx for #90

## 2012-03-27 NOTE — Telephone Encounter (Signed)
Received today a 2nd request for Pravastatin - it does show already sent on 7.8.13, can you re-send please

## 2012-04-02 ENCOUNTER — Telehealth: Payer: Self-pay | Admitting: Internal Medicine

## 2012-04-02 MED ORDER — PRAVASTATIN SODIUM 40 MG PO TABS: ORAL_TABLET | ORAL | Status: DC

## 2012-04-02 NOTE — Telephone Encounter (Signed)
Patient with pending appointment  

## 2012-04-02 NOTE — Telephone Encounter (Signed)
Refill: Pravastatin 40mg  tab. Take one tablet by mouth nightly at bedtime. Need office visit. Qty 90. Last fill 09-26-11

## 2012-05-09 ENCOUNTER — Encounter: Payer: Self-pay | Admitting: Internal Medicine

## 2012-05-09 ENCOUNTER — Ambulatory Visit (INDEPENDENT_AMBULATORY_CARE_PROVIDER_SITE_OTHER): Payer: Medicare Other | Admitting: Internal Medicine

## 2012-05-09 VITALS — BP 118/72 | HR 58 | Temp 98.3°F | Resp 12 | Ht 63.03 in | Wt 145.0 lb

## 2012-05-09 DIAGNOSIS — Z0000 Encounter for general adult medical examination without abnormal findings: Secondary | ICD-10-CM

## 2012-05-09 DIAGNOSIS — Z23 Encounter for immunization: Secondary | ICD-10-CM

## 2012-05-09 DIAGNOSIS — E785 Hyperlipidemia, unspecified: Secondary | ICD-10-CM

## 2012-05-09 DIAGNOSIS — E559 Vitamin D deficiency, unspecified: Secondary | ICD-10-CM

## 2012-05-09 NOTE — Patient Instructions (Addendum)
Preventive Health Care: Exercise  30-45  minutes a day, 3-4 days a week. Walking is especially valuable in preventing Osteoporosis. Eat a low-fat diet with lots of fruits and vegetables, up to 7-9 servings per day.  Consume less than 30 grams of sugar per day from foods & drinks with High Fructose Corn Syrup as # 1,2,3 or #4 on label. Please take enteric-coated aspirin 81 mg daily with breakfast.  Please  schedule fasting Labs : BMET,Lipids, hepatic panel,  TSH, Vitamin D3 level.PLEASE BRING THESE INSTRUCTIONS TO FOLLOW UP  LAB APPOINTMENT.This will guarantee correct labs are drawn, eliminating need for repeat blood sampling ( needle sticks ! ). Diagnoses /Codes: 272.4,995.20,268.9   If you activate My Chart; the results can be released to you as soon as they populate from the lab. If you choose not to use this program; the labs have to be reviewed, copied & mailed   causing a delay in getting the results to you.

## 2012-05-09 NOTE — Addendum Note (Signed)
Addended by: Maurice Small on: 05/09/2012 03:19 PM   Modules accepted: Orders

## 2012-05-09 NOTE — Progress Notes (Signed)
Subjective:    Patient ID: Audrey [name], female    DOB: October 03, 1945, 66 y.o.   MRN: [id number]  HPI Medicare Wellness Visit:  The following psychosocial & medical history were reviewed as required by Medicare.   Social history: caffeine: 3/4 cup , alcohol:  none ,  tobacco use : never  & exercise : minimal.   Home & personal  safety / fall risk: no issues, activities of daily living: no limitations , seatbelt use : yes , and smoke alarm employment : yes.  Power of Attorney/Living Will status : in place  Vision ( as recorded per Nurse) & Hearing  evaluation :  Ophth exam < 12 mos; no hearing exam. Orientation :oriented X 3 , memory & recall :good,  math testing: good,and mood & affect : normal . Depression / anxiety: denied Travel history : 2010 [place], immunization status :Shingles needed , transfusion history:  no, and preventive health surveillance ( colonoscopies, BMD , etc as per protocol/ [facility]): colonoscopy up to date, Dental care:  Every 3 mos . Chart reviewed &  Updated. Active issues reviewed & addressed.       Review of Systems Dyslipidemia assessment: Prior Advanced Lipid Testing: 2011.   Family history of premature CAD/ MI: yes .  Nutrition: no specific diet .  Diabetes : no & no FH . HTN: no Med compliance: yes  Weight :  stable. ROS: fatigue: no; chest pain : no;claudication: no; palpitations: no; abd pain/bowel changes: no ; myalgias:no;  syncope : no ; memory loss: no;skin changes: no.       Objective:   Physical Exam Gen.:  well-nourished in appearance. Alert, appropriate and cooperative throughout exam. Head: Normocephalic without obvious abnormalities  Eyes: No corneal or conjunctival inflammation noted. Pupils equal round reactive to light and accommodation. Fundal exam is benign without hemorrhages, exudate, papilledema. Extraocular motion intact. Vision grossly normal with lenses. Ears: External  ear exam reveals no significant lesions or deformities.  Canals clear .TMs normal. Hearing is grossly normal bilaterally. Nose: External nasal exam reveals no deformity or inflammation. Nasal mucosa are pink and moist. No lesions or exudates noted.  Mouth: Oral mucosa and oropharynx reveal no lesions or exudates. Teeth in good repair. Neck: No deformities, masses, or tenderness noted. Range of motion & Thyroid normal Lungs: Normal respiratory effort; chest expands symmetrically. Lungs are clear to auscultation without rales, wheezes, or increased work of breathing. Heart: Normal  Rhythm; slow rate. Normal S1 and S2. No gallop, click, or rub. S4 w/o  murmur. Abdomen: Bowel sounds normal; abdomen soft and nontender. No masses, organomegaly or hernias noted. Genitalia: Dr Senaida Ores                                                Musculoskeletal/extremities: Minor lordosis noted of the upper thoracic spine. No clubbing, cyanosis, edema, or deformity noted. Range of motion  normal .Tone & strength  normal.Joints normal. Nail health  good. Vascular: Carotid, radial artery, dorsalis pedis and  posterior tibial pulses are full and equal. No bruits present. Neurologic: Alert and oriented x3. Deep tendon reflexes symmetrical and normal.          Skin: Intact without suspicious lesions or rashes. Lymph: No cervical, axillary lymphadenopathy present. Psych: Mood and affect are normal. Normally interactive  Assessment & Plan:  #1 Medicare Wellness Exam; criteria met ; data entered #2 Problem List reviewed ; Assessment/ Recommendations made Plan: see Orders

## 2012-05-15 ENCOUNTER — Other Ambulatory Visit (INDEPENDENT_AMBULATORY_CARE_PROVIDER_SITE_OTHER): Payer: Medicare Other

## 2012-05-15 DIAGNOSIS — E785 Hyperlipidemia, unspecified: Secondary | ICD-10-CM

## 2012-05-15 DIAGNOSIS — T887XXA Unspecified adverse effect of drug or medicament, initial encounter: Secondary | ICD-10-CM

## 2012-05-15 DIAGNOSIS — E559 Vitamin D deficiency, unspecified: Secondary | ICD-10-CM

## 2012-05-15 LAB — LIPID PANEL
HDL: 54.6 mg/dL (ref >39.00)
Triglycerides: 97 mg/dL (ref 0.0–149.0)
VLDL: 19.4 mg/dL (ref 0.0–40.0)

## 2012-05-15 LAB — BASIC METABOLIC PANEL
Calcium: 9.4 mg/dL (ref 8.4–10.5)
GFR: 85.78 mL/min (ref >60.00)
Glucose, Bld: 92 mg/dL (ref 70–99)
Potassium: 4.1 mEq/L (ref 3.5–5.1)
Sodium: 140 mEq/L (ref 135–145)

## 2012-05-15 LAB — HEPATIC FUNCTION PANEL
ALT: 22 U/L (ref 0–35)
AST: 25 U/L (ref 0–37)
Albumin: 3.9 g/dL (ref 3.5–5.2)
Total Bilirubin: 0.3 mg/dL (ref 0.3–1.2)
Total Protein: 6.8 g/dL (ref 6.0–8.3)

## 2012-05-15 NOTE — Progress Notes (Signed)
Labs only

## 2012-06-12 ENCOUNTER — Other Ambulatory Visit: Payer: Self-pay | Admitting: Internal Medicine

## 2012-06-12 NOTE — Telephone Encounter (Signed)
Refill done.  

## 2012-06-18 ENCOUNTER — Other Ambulatory Visit: Payer: Self-pay | Admitting: Internal Medicine

## 2012-06-20 ENCOUNTER — Other Ambulatory Visit: Payer: Self-pay | Admitting: Internal Medicine

## 2012-08-14 ENCOUNTER — Other Ambulatory Visit: Payer: Self-pay | Admitting: Internal Medicine

## 2012-08-14 NOTE — Telephone Encounter (Signed)
Nonsteroidal anti-inflammatory agents such as Diclofenac and should be taken as infrequently as possible & only with food in  the stomach. These are associated with an increased risk of gastric bleeding and cardiac disease if taken on a regular basis.   R# 30

## 2012-08-14 NOTE — Telephone Encounter (Signed)
Last OV 05/09/12, requested medication not on med list, do you recall rx'ing

## 2012-09-19 ENCOUNTER — Other Ambulatory Visit: Payer: Self-pay | Admitting: Internal Medicine

## 2013-01-03 ENCOUNTER — Encounter: Payer: Self-pay | Admitting: Internal Medicine

## 2013-01-03 ENCOUNTER — Ambulatory Visit (INDEPENDENT_AMBULATORY_CARE_PROVIDER_SITE_OTHER): Payer: Medicare Other | Admitting: Internal Medicine

## 2013-01-03 ENCOUNTER — Telehealth: Payer: Self-pay | Admitting: Internal Medicine

## 2013-01-03 ENCOUNTER — Other Ambulatory Visit: Payer: Self-pay | Admitting: Internal Medicine

## 2013-01-03 VITALS — BP 124/78 | HR 66 | Temp 98.1°F | Wt 151.0 lb

## 2013-01-03 DIAGNOSIS — IMO0001 Reserved for inherently not codable concepts without codable children: Secondary | ICD-10-CM

## 2013-01-03 DIAGNOSIS — M7918 Myalgia, other site: Secondary | ICD-10-CM

## 2013-01-03 LAB — CK: Total CK: 111 U/L (ref 7–177)

## 2013-01-03 MED ORDER — CYCLOBENZAPRINE HCL 5 MG PO TABS: ORAL_TABLET | ORAL | Status: DC

## 2013-01-03 MED ORDER — TRAMADOL HCL 50 MG PO TABS: 50.0000 mg | ORAL_TABLET | Freq: Three times a day (TID) | ORAL | Status: DC | PRN

## 2013-01-03 NOTE — Telephone Encounter (Signed)
Take 1/2 pill of Tramadol & not within 4 hrs of Prozac

## 2013-01-03 NOTE — Patient Instructions (Addendum)
Use an anti-inflammatory cream such as Aspercreme or Zostrix cream twice a day to the neck as needed. In lieu of this warm moist compresses or  hot water bottle can be used. Do not apply ice .Use a cervical memory foam pillow to prevent hyperextension or hyperflexion of the cervical spine.

## 2013-01-03 NOTE — Progress Notes (Signed)
  Subjective:    Patient ID: Audrey [name], female    DOB: 1946-03-08, 67 y.o.   MRN: [id number]  HPI NECK PAIN: Symptoms began as pain in the posterior neck 3 weeks ago without specific trigger or injury. She does state that she sleeps in odd positions at times but this is a chronic issue. She's had residual soreness. The discomfort does not radiate significantly. Symptoms appear to be worse with neck flexion.No associated  numbness and tingling in the arms or limb weakness .  She has unrelated low back pain. Additionally she describes diffuse myalgias without specific joint symptoms. Pain has improved somewhat with heat ,nonsteroidals, & Tylenol                                                                 Review of Systems Constitutional: No fever, chills, sweats, unexplained weight loss HEENT: No diplopia, blurred vision, or loss of vision. No hearing loss or tinnitus Cardiopulmonary: No chest pain; palpitations; dyspnea MS: No joint stiffness , pain or redness  Heme/Lymph:No abnormal bruising or bleeding Neuro; no incontinence     Objective:   Physical Exam Gen.:  well-nourished in appearance. Alert, appropriate and cooperative throughout exam.  Head: Normocephalic without obvious abnormalities  Eyes: No corneal or conjunctival inflammation noted. FOV normal. Extraocular motion intact. Vision grossly normal with lenses   Neck: No deformities, masses, or tenderness noted. Range of motion decreased. Thyroid normal Lungs: Normal respiratory effort; chest expands symmetrically. Lungs are clear to auscultation without rales, wheezes, or increased work of breathing. Heart: Normal rate and rhythm. Normal S1 and S2. No gallop, click, or rub.No murmur.                                 Musculoskeletal/extremities: Minimal accentuated curvature of upper thoracic  Spine.  No clubbing, cyanosis, edema, or significant extremity  deformity noted. Tone & strength  Normal. Joints normal .  Nail health good.  Vascular: Carotid pulses are full and equal. No bruits present. Neurologic: Alert and oriented x3. Deep tendon reflexes symmetrical and normal. Cranial nerve exam normal. No weakness to opposition in the hands        Skin: Intact without suspicious lesions or rashes. Lymph: No cervical, axillary lymphadenopathy present. Psych: Mood and affect are normal. Normally interactive                                                                                     Assessment & Plan:  #1 neck pain; probable cervical degenerative disc issues  #2 diffuse myalgias  Plan: See orders and recommendations

## 2013-01-03 NOTE — Telephone Encounter (Signed)
Patient states re rx her tramadol, but it says it interacts with prozac, which she takes. Pt would like to know if she can take the tramadol.

## 2013-01-04 NOTE — Telephone Encounter (Signed)
OK X1 

## 2013-01-04 NOTE — Telephone Encounter (Signed)
Patient was seen yesterday, please advise if ok to fill Voltaren

## 2013-01-04 NOTE — Telephone Encounter (Signed)
Discuss with patient  

## 2013-01-10 ENCOUNTER — Ambulatory Visit: Payer: Medicare Other | Admitting: Internal Medicine

## 2013-01-16 ENCOUNTER — Telehealth: Payer: Self-pay | Admitting: Internal Medicine

## 2013-01-16 NOTE — Telephone Encounter (Signed)
   Her sedimentation rate and CK ( muscle enzyme) were normal. This suggests this is not related to her statin or a rheumatologic condition. Symptoms are worrisome for nerve compression related to spine issues. I do recommend referral to a nonoperative back specialist such as Dr. Ethelene Hal .

## 2013-01-16 NOTE — Telephone Encounter (Signed)
Returned call to pt. She stated that she told the nurse that comes into her home that she has been achy all over, nurse suggested that the pravastatin could be the cause of the muscle aches. Pt complaining of a deep burning pain in her buttocks and leg, this is not every day pain. Pt states she has been dealing with this pain for years "has been dealing with it". Please advise.

## 2013-01-16 NOTE — Telephone Encounter (Signed)
Patient Information:  Caller Name: Audrey [name]  Phone: [phone]  Patient: Audrey, [name]  Gender: Female  DOB: [birth year]  Age: 67 Years  PCP: Audrey [name]  Office Follow Up:  Does the office need to follow up with this patient?: Yes  Instructions For The Office: Patient would medication reviewed by physician- cholesterol medicaiton Pravastatin  RN Note:  Reviewed dietary changes with increasing water 6-8 glasses a day (muscle fatigue).  Reviewed Pravastatin information by Health education. Advised I would forward medication concerns to physician . Understanding expressed.  Symptoms  Reason For Call & Symptoms: Patient is complaining of "body Aches" for a couple of years.  She describes as constant. At times she is better.  Described as "all over".  She has spoken with Dr. Alwyn [name] regarding symptoms.  Her insurance nurse mentioned that it may be Pravastatin causing muscle aches.  Would he look at her medication ?    She does not drink water  Reviewed Health History In EMR: Yes  Reviewed Medications In EMR: Yes  Reviewed Allergies In EMR: Yes  Reviewed Surgeries / Procedures: Yes  Date of Onset of Symptoms: 01/16/2013  Guideline(s) Used:  Weakness (Generalized) and Fatigue  Disposition Per Guideline:   See Within 2 Weeks in Office  Reason For Disposition Reached:   Weakness is a chronic symptom (recurrent or ongoing AND lasting > 4 weeks)  Advice Given:  For All Fevers:  Drink cold fluids orally to prevent dehydration (Reason: good hydration replaces sweat and improves heat loss via skin). Adults should drink 6-8 glasses of water daily.  Dress in one layer of lightweight clothing and sleep with one light blanket.  For fevers 100-101 F (37.8-38.3 C) this is the only treatment and fever medicine is unnecessary.  Call Back If:  Unable to stand or walk  Passes out  Breathing difficulty occurs  You become worse.  RN Overrode Recommendation:  Follow Up With Office  Later  Patient would medication reviewed by physician- cholesterol medicaiton Pravastatin

## 2013-01-16 NOTE — Telephone Encounter (Signed)
Pt had already seen Dr. Ethelene Hal once at your request. Would like to think about it. Will notify the office if symptoms worsen, persist or if she changes he mind about the referral.

## 2013-02-04 ENCOUNTER — Encounter: Payer: Self-pay | Admitting: Internal Medicine

## 2013-07-02 ENCOUNTER — Other Ambulatory Visit: Payer: Self-pay | Admitting: *Deleted

## 2013-07-02 ENCOUNTER — Other Ambulatory Visit: Payer: Self-pay | Admitting: Internal Medicine

## 2013-07-02 MED ORDER — FLUOXETINE HCL 20 MG PO CAPS: ORAL_CAPSULE | ORAL | Status: DC

## 2013-07-02 NOTE — Telephone Encounter (Signed)
Prozac refill sent to pharmacy

## 2013-07-11 ENCOUNTER — Other Ambulatory Visit: Payer: Self-pay

## 2013-07-11 ENCOUNTER — Other Ambulatory Visit: Payer: Self-pay | Admitting: Internal Medicine

## 2013-07-11 NOTE — Telephone Encounter (Signed)
Pravastatin refill sent to pharmacy

## 2013-09-26 ENCOUNTER — Ambulatory Visit (INDEPENDENT_AMBULATORY_CARE_PROVIDER_SITE_OTHER): Payer: Medicare Other | Admitting: Internal Medicine

## 2013-09-26 ENCOUNTER — Encounter: Payer: Self-pay | Admitting: Internal Medicine

## 2013-09-26 VITALS — BP 104/67 | HR 76 | Temp 99.1°F | Wt 151.0 lb

## 2013-09-26 DIAGNOSIS — J988 Other specified respiratory disorders: Secondary | ICD-10-CM

## 2013-09-26 DIAGNOSIS — H9209 Otalgia, unspecified ear: Secondary | ICD-10-CM

## 2013-09-26 DIAGNOSIS — H9202 Otalgia, left ear: Secondary | ICD-10-CM

## 2013-09-26 DIAGNOSIS — R05 Cough: Secondary | ICD-10-CM

## 2013-09-26 DIAGNOSIS — B9789 Other viral agents as the cause of diseases classified elsewhere: Secondary | ICD-10-CM

## 2013-09-26 DIAGNOSIS — R059 Cough, unspecified: Secondary | ICD-10-CM

## 2013-09-26 DIAGNOSIS — IMO0001 Reserved for inherently not codable concepts without codable children: Secondary | ICD-10-CM

## 2013-09-26 MED ORDER — TRAMADOL HCL 50 MG PO TABS: 50.0000 mg | ORAL_TABLET | Freq: Four times a day (QID) | ORAL | Status: DC | PRN

## 2013-09-26 NOTE — Progress Notes (Signed)
   Subjective:    Patient ID: Audrey [name], female    DOB: [birth year], 68 y.o.   MRN: [id number]  HPI  Onset acutely 09/24/13 as chills, headache, pain in the left ear, and cough with scant clear sputum. She also described significant arthralgias and myalgias.  She was vacationing at [employer] with crown exposures  Symptoms have progressed and associated with significant rhinitis  Tylenol has been of some benefit  She did not take the flu shot.    Review of Systems  She denies frontal sinus pain, facial pain, nasal., Persistent or significant dental pain, or sore throat. The cough is not associated with shortness of breath or wheezing.     Objective:   Physical Exam  General appearance:good health ;well nourished; no acute distress or increased work of breathing is present.  No  lymphadenopathy about the head, neck, or axilla noted.   Eyes: No conjunctival inflammation or lid edema is present.   Ears:  External ear exam shows no significant lesions or deformities.  Otoscopic examination reveals clear canals.L TM mucinous Nose:  External nasal examination shows no deformity or inflammation. Nasal mucosa are pink and moist without lesions or exudates. No septal dislocation or deviation.No obstruction to airflow.   Oral exam: Dental hygiene is good; lips and gums are healthy appearing.There is no oropharyngeal erythema or exudate noted.   Neck:  No deformities, thyromegaly, masses, or tenderness noted.   Supple with full range of motion without pain.   Heart:  Normal rate and regular rhythm. S1 and S2 normal without gallop, murmur, click, rub .Distant  sounds.   Lungs:Chest clear to auscultation; no wheezes, rhonchi,rales ,or rubs present.No increased work of breathing.    Extremities:  No cyanosis, edema, or clubbing  noted    Skin: Warm & dry         Assessment & Plan:  #1 viral syndrome  See orders

## 2013-09-26 NOTE — Patient Instructions (Addendum)
Plain Mucinex (NOT D) for thick secretions ;force NON dairy fluids .   Nasal cleansing in the shower as discussed with lather of mild shampoo.After 10 seconds wash off lather while  exhaling through nostrils. Make sure that all residual soap is removed to prevent irritation.  Nasacort AQ OTC  1 spray in each nostril twice a day as needed. Use the "crossover" technique into opposite nostril spraying toward opposite ear @ 45 degree angle, not straight up into nostril.  Use a Neti pot daily only  as needed for significant sinus congestion; going from open side to congested side . Plain Allegra (NOT D )  160 daily , Loratidine 10 mg , OR Zyrtec 10 mg @ bedtime  as needed for itchy eyes & sneezing. NSAIDS ( Aleve, Advil, Naproxen) or Tylenol every 4 hrs as needed for fever as discussed based on label recommendations. Stay well hydrated. Drink to thirst up to 40 ounces of fluids daily.

## 2013-09-26 NOTE — Progress Notes (Signed)
Pre visit review using our clinic review tool, if applicable. No additional management support is needed unless otherwise documented below in the visit note. 

## 2014-01-13 ENCOUNTER — Other Ambulatory Visit: Payer: Self-pay | Admitting: Internal Medicine

## 2014-01-13 NOTE — Telephone Encounter (Signed)
OK X 1 , NR 

## 2014-01-13 NOTE — Telephone Encounter (Signed)
Last ov with you on 09/26/2013 Med last filled 06/2013 #90 plus 1

## 2014-01-19 ENCOUNTER — Other Ambulatory Visit: Payer: Self-pay | Admitting: Internal Medicine

## 2014-01-20 NOTE — Telephone Encounter (Signed)
Last ov- 09/26/13

## 2014-01-20 NOTE — Telephone Encounter (Signed)
#  30 ; needs OV prior to refills

## 2014-01-30 ENCOUNTER — Other Ambulatory Visit: Payer: Self-pay | Admitting: Internal Medicine

## 2014-05-28 ENCOUNTER — Encounter: Payer: Self-pay | Admitting: Internal Medicine

## 2014-05-28 ENCOUNTER — Ambulatory Visit (INDEPENDENT_AMBULATORY_CARE_PROVIDER_SITE_OTHER): Payer: Medicare Other | Admitting: Internal Medicine

## 2014-05-28 VITALS — BP 124/80 | HR 71 | Temp 98.5°F | Resp 14 | Wt 152.0 lb

## 2014-05-28 DIAGNOSIS — E785 Hyperlipidemia, unspecified: Secondary | ICD-10-CM

## 2014-05-28 DIAGNOSIS — E559 Vitamin D deficiency, unspecified: Secondary | ICD-10-CM

## 2014-05-28 DIAGNOSIS — G4710 Hypersomnia, unspecified: Secondary | ICD-10-CM

## 2014-05-28 DIAGNOSIS — M2550 Pain in unspecified joint: Secondary | ICD-10-CM

## 2014-05-28 MED ORDER — FLUOXETINE HCL 20 MG PO CAPS: ORAL_CAPSULE | ORAL | Status: DC

## 2014-05-28 NOTE — Progress Notes (Signed)
   Subjective:    Patient ID: Audrey [name], female    DOB: 1946/03/25, 68 y.o.   MRN: [id number]  HPI   She is on a heart healthy diet; she does not engage in an exercise program.  She is compliant with her pravastatin and denies any adverse effects.  Advanced lipid testing reveals her LDL goal is less than 100; ideally less than 70. Her last lipids were done 05/15/12; at that time triglycerides 97, HDL 54.6, and LDL 88. Liver function tests were normal. TSH was high normal at 4.42.  She has a strong family history of coronary disease. Her father had heart attack in his 2s; mother 17; maternal grandfather in his 72s. A brother had bypass in his 61s.  She feels that the fluoxetine is of benefit. She believes that she her chronic somnolence and decreased motivation prior to starting meds have improved  Her major issues are diffuse myalgias and arthralgias. This requires  She sit up at night. She also has some aching intermittently down the left lower extremity as well as left upper extremity. The aching is not a morning phenomena.   Review of Systems   She specifically denies chest pain, palpitations, exertional dyspnea, claudication, edema, paroxysmal nocturnal dyspnea  She has no associated abdominal symptoms or memory issues.  She denies anxiety, depression, panic attacks, or anhedonia.     Objective:   Physical Exam   Positive or pertinent findings include: Minor crepitus of the knees; she has negative straight leg raising to 90 bilaterally.  General appearance :adequately nourished; in no distress Eyes: No conjunctival inflammation or scleral icterus is present. Oral exam: Dental hygiene is good. Lips and gums are healthy appearing.There is no oropharyngeal erythema or exudate noted.  Heart:  Normal rate and regular rhythm. S1 and S2 normal without gallop, murmur, click, rub or other extra sounds   Lungs:Chest clear to auscultation; no wheezes, rhonchi,rales ,or rubs  present.No increased work of breathing.  Abdomen: bowel sounds normal, soft and non-tender without masses, organomegaly or hernias noted.  No guarding or rebound. No flank tenderness to percussion. Skin:Warm & dry.  Intact without suspicious lesions or rashes ; no jaundice or tenting Lymphatic: No lymphadenopathy is noted about the head, neck, axilla Strength , tone & DTRs WNL           Assessment & Plan:  See Current Assessment & Plan in Problem List under specific Diagnosis

## 2014-05-28 NOTE — Assessment & Plan Note (Signed)
Renew Fluoxetine

## 2014-05-28 NOTE — Assessment & Plan Note (Signed)
Lipids, LFTs, TSH ,CK 

## 2014-05-28 NOTE — Assessment & Plan Note (Signed)
RA, CCP, sed rate

## 2014-05-28 NOTE — Progress Notes (Signed)
Pre visit review using our clinic review tool, if applicable. No additional management support is needed unless otherwise documented below in the visit note. 

## 2014-05-28 NOTE — Patient Instructions (Signed)
Your next office appointment will be determined based upon review of your pending labs . Those instructions will be transmitted to you through My Chart .  Followup as needed for your acute issue. Please report any significant change in your symptoms. 

## 2014-06-16 ENCOUNTER — Other Ambulatory Visit (INDEPENDENT_AMBULATORY_CARE_PROVIDER_SITE_OTHER): Payer: Medicare Other

## 2014-06-16 DIAGNOSIS — M2550 Pain in unspecified joint: Secondary | ICD-10-CM

## 2014-06-16 DIAGNOSIS — E559 Vitamin D deficiency, unspecified: Secondary | ICD-10-CM

## 2014-06-16 DIAGNOSIS — E785 Hyperlipidemia, unspecified: Secondary | ICD-10-CM

## 2014-06-16 LAB — BASIC METABOLIC PANEL
BUN: 13 mg/dL (ref 6–23)
CHLORIDE: 104 meq/L (ref 96–112)
CO2: 26 meq/L (ref 19–32)
Calcium: 9.5 mg/dL (ref 8.4–10.5)
Creatinine, Ser: 0.8 mg/dL (ref 0.4–1.2)
GFR: 81.33 mL/min (ref >60.00)
Glucose, Bld: 84 mg/dL (ref 70–99)
Potassium: 4.3 mEq/L (ref 3.5–5.1)
Sodium: 141 mEq/L (ref 135–145)

## 2014-06-16 LAB — HEPATIC FUNCTION PANEL
ALK PHOS: 51 U/L (ref 39–117)
ALT: 18 U/L (ref 0–35)
AST: 20 U/L (ref 0–37)
Albumin: 3.6 g/dL (ref 3.5–5.2)
BILIRUBIN TOTAL: 0.3 mg/dL (ref 0.2–1.2)
Bilirubin, Direct: 0.1 mg/dL (ref 0.0–0.3)
Total Protein: 7.4 g/dL (ref 6.0–8.3)

## 2014-06-16 LAB — LIPID PANEL
CHOL/HDL RATIO: 4
Cholesterol: 210 mg/dL — ABNORMAL HIGH (ref 0–200)
HDL: 55.5 mg/dL (ref >39.00)
LDL CALC: 126 mg/dL — AB (ref 0–99)
NONHDL: 154.5
Triglycerides: 145 mg/dL (ref 0.0–149.0)
VLDL: 29 mg/dL (ref 0.0–40.0)

## 2014-06-16 LAB — CK: CK TOTAL: 75 U/L (ref 7–177)

## 2014-06-16 LAB — TSH: TSH: 4.52 u[IU]/mL — ABNORMAL HIGH (ref 0.35–4.50)

## 2014-06-16 LAB — RHEUMATOID FACTOR: Rhuematoid fact SerPl-aCnc: <10 IU/mL (ref <=14)

## 2014-06-16 LAB — VITAMIN D 25 HYDROXY (VIT D DEFICIENCY, FRACTURES): VITD: 38.72 ng/mL (ref 30.00–100.00)

## 2014-06-16 LAB — SEDIMENTATION RATE: Sed Rate: 16 mm/hr (ref 0–22)

## 2014-06-17 ENCOUNTER — Other Ambulatory Visit: Payer: Self-pay | Admitting: Internal Medicine

## 2014-06-17 DIAGNOSIS — R7989 Other specified abnormal findings of blood chemistry: Secondary | ICD-10-CM

## 2014-06-17 LAB — CYCLIC CITRUL PEPTIDE ANTIBODY, IGG: Cyclic Citrullin Peptide Ab: <2 U/mL (ref 0.0–5.0)

## 2014-06-23 ENCOUNTER — Encounter: Payer: Self-pay | Admitting: Internal Medicine

## 2014-06-23 MED ORDER — ATORVASTATIN CALCIUM 20 MG PO TABS: 20.0000 mg | ORAL_TABLET | Freq: Every day | ORAL | Status: DC

## 2014-09-29 ENCOUNTER — Other Ambulatory Visit: Payer: Self-pay | Admitting: Internal Medicine

## 2014-11-24 ENCOUNTER — Other Ambulatory Visit: Payer: Self-pay | Admitting: Internal Medicine

## 2014-11-24 NOTE — Telephone Encounter (Signed)
OK X1 Rec Medicare CPX

## 2014-12-03 ENCOUNTER — Encounter: Payer: Self-pay | Admitting: Internal Medicine

## 2014-12-03 ENCOUNTER — Ambulatory Visit (INDEPENDENT_AMBULATORY_CARE_PROVIDER_SITE_OTHER): Payer: Medicare Other | Admitting: Internal Medicine

## 2014-12-03 ENCOUNTER — Other Ambulatory Visit (INDEPENDENT_AMBULATORY_CARE_PROVIDER_SITE_OTHER): Payer: Medicare Other

## 2014-12-03 VITALS — BP 106/80 | HR 64 | Temp 97.9°F | Ht 64.0 in | Wt 153.5 lb

## 2014-12-03 DIAGNOSIS — R946 Abnormal results of thyroid function studies: Secondary | ICD-10-CM

## 2014-12-03 DIAGNOSIS — G4710 Hypersomnia, unspecified: Secondary | ICD-10-CM

## 2014-12-03 DIAGNOSIS — D485 Neoplasm of uncertain behavior of skin: Secondary | ICD-10-CM | POA: Diagnosis not present

## 2014-12-03 DIAGNOSIS — R7989 Other specified abnormal findings of blood chemistry: Secondary | ICD-10-CM

## 2014-12-03 LAB — T4, FREE: FREE T4: 0.68 ng/dL (ref 0.60–1.60)

## 2014-12-03 LAB — TSH: TSH: 5.22 u[IU]/mL — ABNORMAL HIGH (ref 0.35–4.50)

## 2014-12-03 MED ORDER — FLUOXETINE HCL 20 MG PO CAPS: 20.0000 mg | ORAL_CAPSULE | Freq: Every day | ORAL | Status: DC

## 2014-12-03 NOTE — Patient Instructions (Addendum)
  As we discussed the neurotransmitters are essential for good brain function, both intellectually & emotionally. The agents to increase the neurotransmitter levels are not addictive and simply keep essential neurotransmitters at therapeutic levels. If these levels become severely depleted; depression or panic attacks can occur and changes in anatomy & function would be present on a PET scan of the brain.  Apply Aveeno Daily  Moisturizing Lotion  twice a day to the skin lesion until seen by Derm.  Your next office appointment will be determined based upon review of your pending labs .  Those instructions will be transmitted to you by My Chart   Critical results will be called.   Followup as needed for any active or acute issue. Please report any significant change in your symptoms.

## 2014-12-03 NOTE — Progress Notes (Signed)
Pre visit review using our clinic review tool, if applicable. No additional management support is needed unless otherwise documented below in the visit note. 

## 2014-12-03 NOTE — Progress Notes (Signed)
   Subjective:    Patient ID: Audrey [name], female    DOB: July 30, 1946, 69 y.o.   MRN: [id number]  HPI She has been taking fluoxetine compliantly without adverse effect. She states that it has "decreased  feeling of sleepiness"(hypersomnulence).She denies anxiety, irritability, difficulty triaging, panic attacks, or depression.  Her last TSH was 4.52 on 06/16/14. She has not returned for follow-up as recommended.  She has a small papule over the right posterior thorax of concern. She will be seeing a dermatologist in 2 months  Review of Systems  No significant sleep disorder; change in appetite;weight change. No blurred, double ,loss of vision No palpitations; racing; irregularity No constipation; diarrhea;hoarseness. No change in nails,skin or skin No numbness or tingling; tremor No anxiety; depression; panic attacks No temperature intolerance to heat ,cold     Objective:   Physical Exam  Gen.:  Adequately nourished; in no acute distress Eyes: Extraocular motion intact; no lid lag , proptosis , or nystagmus Neck: full ROM; no masses ; thyroid normal  Heart: Normal rhythm and rate without significant murmur, gallop, or extra heart sounds Lungs: Chest clear to auscultation without rales,rales, wheezes Neuro:Deep tendon reflexes are equal and within normal limits; no tremor  Skin/Nails: Warm and dry without significant lesions or rashes; no onycholysis. There is a fleshy flat lesion 6X7 mm over the right posterior thorax; this does not exhibit asymmetry or color variation. Lymphatic: no cervical or axillary LA Psych: Normally communicative and interactive; no abnormal mood or affect clinically.         Assessment & Plan:  #1hypersomnulence #2 neoplasm,benign character suggested #3 borderline TSH See orders

## 2014-12-04 ENCOUNTER — Other Ambulatory Visit: Payer: Self-pay | Admitting: Internal Medicine

## 2014-12-04 DIAGNOSIS — R7989 Other specified abnormal findings of blood chemistry: Secondary | ICD-10-CM

## 2014-12-04 MED ORDER — LEVOTHYROXINE SODIUM 25 MCG PO TABS: 25.0000 ug | ORAL_TABLET | Freq: Every day | ORAL | Status: DC

## 2014-12-04 NOTE — Addendum Note (Signed)
Addended by: Shelly Coss on: 12/04/2014 08:38 AM   Modules accepted: Orders

## 2014-12-04 NOTE — Progress Notes (Signed)
Left a message for patient that new Levothyroxine dose has been sent to her pharmacy (see labs)

## 2014-12-30 ENCOUNTER — Other Ambulatory Visit: Payer: Self-pay | Admitting: Internal Medicine

## 2015-02-05 ENCOUNTER — Telehealth: Payer: Self-pay

## 2015-02-05 NOTE — Telephone Encounter (Signed)
Patient called to confirm apt tomorrow 6/3 for AWV at 12:30 Will fup with Dr. Linna Darner at [facility]

## 2015-02-06 ENCOUNTER — Encounter: Payer: Self-pay | Admitting: Internal Medicine

## 2015-02-06 ENCOUNTER — Ambulatory Visit (INDEPENDENT_AMBULATORY_CARE_PROVIDER_SITE_OTHER)
Admission: RE | Admit: 2015-02-06 | Discharge: 2015-02-06 | Disposition: A | Discharge status: Home / Self Care | Payer: Medicare Other | Source: Ambulatory Visit | Attending: Internal Medicine | Admitting: Internal Medicine

## 2015-02-06 ENCOUNTER — Ambulatory Visit (INDEPENDENT_AMBULATORY_CARE_PROVIDER_SITE_OTHER): Payer: Medicare Other | Admitting: Internal Medicine

## 2015-02-06 VITALS — BP 112/80 | HR 63 | Temp 98.1°F | Ht 64.0 in | Wt 150.8 lb

## 2015-02-06 DIAGNOSIS — Z0000 Encounter for general adult medical examination without abnormal findings: Secondary | ICD-10-CM

## 2015-02-06 DIAGNOSIS — Z1231 Encounter for screening mammogram for malignant neoplasm of breast: Secondary | ICD-10-CM | POA: Diagnosis not present

## 2015-02-06 DIAGNOSIS — E785 Hyperlipidemia, unspecified: Secondary | ICD-10-CM

## 2015-02-06 DIAGNOSIS — M4716 Other spondylosis with myelopathy, lumbar region: Secondary | ICD-10-CM

## 2015-02-06 DIAGNOSIS — Z23 Encounter for immunization: Secondary | ICD-10-CM

## 2015-02-06 DIAGNOSIS — E039 Hypothyroidism, unspecified: Secondary | ICD-10-CM

## 2015-02-06 DIAGNOSIS — K5730 Diverticulosis of large intestine without perforation or abscess without bleeding: Secondary | ICD-10-CM

## 2015-02-06 DIAGNOSIS — E559 Vitamin D deficiency, unspecified: Secondary | ICD-10-CM

## 2015-02-06 NOTE — Assessment & Plan Note (Signed)
CBC

## 2015-02-06 NOTE — Progress Notes (Signed)
Subjective:   Audrey [name] is a 69 y.o. female who presents for Medicare Annual (Subsequent) preventive examination.  Review of Systems:   HRA assessment completed during visit; / Audrey [name] Patient is here for Annual Wellness Assessment  The patient describes their health better, the same or worse than last year? "ok" Health issue; back; This week; past week she has had a bad time; getting better now with 2 aleve's while back hurts    Reviewed: BMI 25.7 Diet: Trying to lose weight ; would like to lose 10 lbs Breakfast; 2 pieces of whole wheat bread Lunch; yogurt; salad Dinner; left overs; vegetables; doesn't eat meat often Gets hungry after dinner;  Reviewed benefit of exercise    Motivation toward goals: desire to lose weight is high; but motivation is low due to fatigue Sleep is deprived; dreams "heavily" / leaning toward uncomfortable;  Doesn't get sleepy until late and lies to go to bed early and likes to get up at 5am "anxiety dreams"   Exercise; takes care of grand kids; 3 and 6yo/  House keeping;  Family Hx Social history: lives with spouse; takes care of 3 and 53 yo grandchildren in [place] x 2 a week;  Use of tobacco; alcohol or illicit drugs: no Labs: chol 210 / trig 145/ LDL 55; HDL 55; very active during the day   Psychosocial support; safe community; firearm safety; smoke alarms;  Caregiver to other? Took care of ht  Goals: Motivation 1-5;   Discussed Goal to improve health based on risk Suggested reading    Screenings; Bone density: completed 2015; osteopenia Colonoscopy; due in august 2022 Mammogram  to be scheduled Pap: if female; gyn does not do anymore EKG 05/09/12  Vision: q 2 year Hearing: to be checked due to inability to hear 500 hz today each rea Dental: follows up frequently  Gave information on safety to take home;   Current Care Team reviewed and updated     Cardiac Risk Factors include: advanced age (>12men, >41  women);family history of premature cardiovascular disease     Objective:     Vitals: BP 112/80 mmHg  Pulse 63  Temp(Src) 98.1 F (36.7 C)  Ht 5\' 4"  (1.626 m)  Wt 150 lb 12 oz (68.38 kg)  BMI 25.86 kg/m2  SpO2 97%  Tobacco History  Smoking status  . Never Smoker   Smokeless tobacco  . Never Used     Counseling given: Yes   Past Medical History  Diagnosis Date  . Arthritis   . Hyperlipidemia   . Cancer     basal cell cancer on face removed; Dr Ronnald Ramp  . Allergy     seasonal allegies; seem worse this year   Past Surgical History  Procedure Laterality Date  . Colonoscopy  2012    neg; [facility]  . Hemorrhoid surgery     Family History  Problem Relation Age of Onset  . Stroke Maternal Grandmother   . Diabetes Neg Hx   . Heart attack Mother     in 86s  . Heart attack Father     in 40s  . Heart attack Maternal Grandfather     in 48s  . Coronary artery disease Brother     stent in 62s  . Uterine cancer Mother   . Other Brother     ? leukemia   History  Sexual Activity  . Sexual Activity: Not on file    Outpatient Encounter Prescriptions as of 02/06/2015  Medication Sig  . atorvastatin (LIPITOR) 20 MG tablet TAKE ONE TABLET BY MOUTH ONE TIME DAILY   . Calcium Carbonate-Vitamin D (CALCIUM + D PO) Take 1,200 mg by mouth daily.  . cholecalciferol (VITAMIN D) 1000 UNITS tablet Take 1,000 Units by mouth every other day.    . diclofenac (VOLTAREN) 75 MG EC tablet TAKE TWO TABLET BY MOUTH DAILY AS NEEDED WITH FOOD. SHOULD BE TAKEN ASINFREQUENTLY AS POSSIBLE.  Marland Kitchen FLUoxetine (PROZAC) 20 MG capsule Take 1 capsule (20 mg total) by mouth daily.  Marland Kitchen levothyroxine (LEVOTHROID) 25 MCG tablet Take 1 tablet (25 mcg total) by mouth daily before breakfast.  . triamcinolone (NASACORT) 55 MCG/ACT AERO nasal inhaler Place 1 spray into the nose daily.  . Multiple Vitamin (MULTIVITAMIN) tablet Take 1 tablet by mouth daily.  . Multiple Vitamins-Minerals (ICAPS MV PO) Take 1 capsule  by mouth 2 (two) times daily.    . traMADol (ULTRAM) 50 MG tablet Take 1 tablet (50 mg total) by mouth every 6 (six) hours as needed. (Patient not taking: Reported on 02/06/2015)   Facility-Administered Encounter Medications as of 02/06/2015  Medication  . 0.9 %  sodium chloride infusion    Activities of Daily Living In your present state of health, do you have any difficulty performing the following activities: 02/06/2015 12/03/2014  Hearing? N N  Vision? N N  Difficulty concentrating or making decisions? N N  Walking or climbing stairs? N N  Dressing or bathing? N N  Doing errands, shopping? N N  Preparing Food and eating ? N -  Using the Toilet? N -  In the past six months, have you accidently leaked urine? N -  Do you have problems with loss of bowel control? N -  Managing your Medications? N -  Managing your Finances? N -  Housekeeping or managing your Housekeeping? N -    Patient Care Team: Hendricks Limes, MD as PCP - General    Assessment:  Objective:  Advanced Directives/ Will reivew  Discussed risk for CVD and or  DM as appropriate  Reviewed  risk and lifestyle choices;   Functional status; Safety Issues identified Depression/ on treatment; anxiety type dreams; doesn't sleep well Does not verbalize depressed mood  Personalized Education given regarding:   Pt determined a personalized goal  Assessment included:  Bone density scan as appropriate Calcium and Vit D as appropriate/ Osteoporosis risk reviewed Taking meds without issues; no barriers identified Stress: Recommendations for managing stress if assessed as a factor;  No Risk for hepatitis or high risk social behavior identified via hepatitis screen Educated on shingles and follow up with insurance company for co-pays or charges applied to Part D benefit.  Safety issues reviewed(driving issues; vision; home environment; support network and environmental safety; gun safety; smoke  detectors) SAFETY Generalized Safety in the home reviewed/     Review Firearm safety as appropriate;  Smoke detectors; Assessed for community safety; Emergency Plan for illness or other Driving:  Sun protection   Cognition assessed by AD8; Score 0 (A score of 2 or greater would indicate the MMSE be completed)    Need for Immunizations or other screenings identified;  (CDC recommmend Prevnar at 65 followed by pnuemovax 23 in one year or 5 years after the last dose.    Exercise Activities and Dietary recommendations Current Exercise Habits:: Exercise is limited by  Goals    . motivation     Has issues getting motivated which started x 69 yo Will explore  issues related issues; sleep disturbance; taking care of mother etc;    . Record weight daily     Exercise;  (recommended 30 minutes; 5 days a week) BMI reviewed and educated Educated regarding online nutrition programs as GumSearch.nl and http://vang.com/; fit68me;           Fall Risk Fall Risk  02/06/2015 12/03/2014 09/26/2013  Falls in the past year? No No No   Depression Screen PHQ 2/9 Scores 02/06/2015 12/03/2014 09/26/2013  PHQ - 2 Score 0 0 0     Cognitive Testing MMSE - Mini Mental State Exam 02/06/2015  Not completed: Unable to complete    Immunization History  Administered Date(s) Administered  . Influenza Whole 05/26/2010  . Influenza-Unspecified 09/19/2014  . Pneumococcal Polysaccharide-23 05/09/2012   Screening Tests Health Maintenance  Topic Date Due  . TETANUS/TDAP  01/02/1964  . ZOSTAVAX  01/01/2005  . PNA vac Low Risk Adult (2 of 2 - PCV13) 05/09/2013  . MAMMOGRAM  01/25/2015  . INFLUENZA VACCINE  04/06/2015  . COLONOSCOPY  04/06/2021  . DEXA SCAN  Completed      Plan:   Will take Tdap and prevnar today Will seek some type of exercise and read more on sleep habits Adpating to changes in life; continues to take care of grandchildren x 2 every week and does enjoy this  During the  course of the visit the patient was educated and counseled about the following appropriate screening and preventive services:   Vaccines to include Pneumoccal, Influenza, Hepatitis B, Td, Zostavax, HCV  To take Prevnar and Tdap today  Electrocardiogram; deferred  Cardiovascular Disease/ reviewed  Colorectal cancer screening/ not due  Bone density screening/ not due; on cal and vit d  Diabetes screening/na  Glaucoma screening; eye check q 2 year  Mammography/PAP;deferred   Nutrition counseling   Patient Instructions (the written plan) was given to the patient.   Wynetta Fines, RN  02/06/2015

## 2015-02-06 NOTE — Assessment & Plan Note (Signed)
Vitamin D level 

## 2015-02-06 NOTE — Assessment & Plan Note (Signed)
Lipids, LFTs, TSH ,CK 

## 2015-02-06 NOTE — Progress Notes (Signed)
   Subjective:    Patient ID: Audrey [name], female    DOB: 07-12-1946, 69 y.o.   MRN: [id number]  HPI The patient is here to assess status of active health conditions.  PMH, FH, & Social History reviewed & updated.  She is on a modified heart healthy diet with decreased fried foods. She does not exercise. She's never smoked and does not drink alcohol.  Her father had heart attack in his 93s and mother in her 27s. Maternal grandmother had a stroke in her 13s.   Her major issue is recurrent low back pain with documented spondylosis on x-rays in 2012. She previously had  seen Dr Hetty Blend back specialist. She does not find tramadol to be of benefit. She finds diclofenac or Aleve of more benefit. Of concern is that she does have reflux symptoms for which she takes TUMS as needed.  The back issues are described as tightness but can be a dull pain which can last up to 3 days. Occasionally it will radiate superiorly. It has impacted her balance and resulted in a broader gait. She has no other neuromuscular symptoms.  She has been compliant with her medicines without adverse effects.  The only endocrinologic symptoms she has is cold intolerance.Thyroid supplement adjusted for TSH of 5.22 on 12/03/14. F/U TSH pending.  Fluoxetine has been of benefit in controlling her lethargy.   Review of Systems  Chest pain, palpitations, tachycardia, exertional dyspnea, paroxysmal nocturnal dyspnea, claudication or edema are absent. No unexplained weight loss, abdominal pain, significant dyspepsia, dysphagia, melena, rectal bleeding, or persistently small caliber stools. Dysuria, pyuria, hematuria, frequency, nocturia or polyuria are denied. Change in hair, skin, nails denied. No bowel changes of constipation or diarrhea.  Blurred vision , diplopia or vision loss absent. There is no numbness, tingling, or weakness in extremities.   No loss of control of bladder or bowels. Radicular type pain absent.      Objective:   Physical Exam Pertinent or positive findings include: She has decreased hearing bilaterally to whisper @ 6 feet. Heart rhythm and rate are slow but regular. She has slightly  decreased pedal pulses. She has crepitus in her knees. Straight leg raising is negative. She is able to perform heel and toe walk without difficulty.  General appearance :adequately nourished; in no distress.  Eyes: No conjunctival inflammation or scleral icterus is present.  Oral exam:  Lips and gums are healthy appearing.There is no oropharyngeal erythema or exudate noted. Dental hygiene is good.  Heart:  S1 and S2 normal without gallop, murmur, click, rub or other extra sounds    Lungs:Chest clear to auscultation; no wheezes, rhonchi,rales ,or rubs present.No increased work of breathing.   Abdomen: bowel sounds normal, soft and non-tender without masses, organomegaly or hernias noted.  No guarding or rebound. No flank tenderness to percussion.  Vascular : all pulses equal ; no bruits present.  Skin:Warm & dry.  Intact without suspicious lesions or rashes ; no tenting or jaundice   Lymphatic: No lymphadenopathy is noted about the head, neck, axilla.   Neuro: Strength, tone & DTRs normal.         Assessment & Plan:  See Current Assessment & Plan in Problem List under specific Diagnosis

## 2015-02-06 NOTE — Patient Instructions (Addendum)
  Audrey [name] , Thank you for taking time to come for your Medicare Wellness Visit. I appreciate your ongoing commitment to your health goals. Please review the following plan we discussed and let me know if I can assist you in the future.   1. Read book related to sleeping well at hs 2. Will integrate some form of strength training/ reviewed risk for osteopenia 3. Will schedule for Mammogram 4. Will take the Prevnar 13 and tdap today; will check with insurance company regarding shingles  These are the goals we discussed: Goals    . motivation     Has issues getting motivated which started x 69 yo Will explore issues related issues; sleep disturbance; taking care of mother etc;    . Record weight daily     Exercise;  (recommended 30 minutes; 5 days a week) BMI reviewed and educated Educated regarding online nutrition programs as GumSearch.nl and http://vang.com/; fit79me;            This is a list of the screening recommended for you and due dates:  Health Maintenance  Topic Date Due  . Tetanus Vaccine  01/02/1964  . Shingles Vaccine  01/01/2005  . Pneumonia vaccines (2 of 2 - PCV13) 05/09/2013  . Mammogram  01/25/2015  . Flu Shot  04/06/2015  . Colon Cancer Screening  04/06/2021  . DEXA scan (bone density measurement)  Completed     Your next office appointment will be determined based upon review of your pending labs  and  xrays  Those written interpretation of the lab results and instructions will be transmitted to you by My Chart   Critical results will be called.   Followup as needed for any active or acute issue. Please report any significant change in your symptoms.

## 2015-02-07 DIAGNOSIS — E039 Hypothyroidism, unspecified: Secondary | ICD-10-CM | POA: Insufficient documentation

## 2015-02-11 ENCOUNTER — Other Ambulatory Visit (INDEPENDENT_AMBULATORY_CARE_PROVIDER_SITE_OTHER): Payer: Medicare Other

## 2015-02-11 DIAGNOSIS — E039 Hypothyroidism, unspecified: Secondary | ICD-10-CM | POA: Diagnosis not present

## 2015-02-11 LAB — TSH: TSH: 3.32 u[IU]/mL (ref 0.35–4.50)

## 2015-02-27 ENCOUNTER — Other Ambulatory Visit: Payer: Self-pay | Admitting: Internal Medicine

## 2015-03-02 ENCOUNTER — Other Ambulatory Visit: Payer: Self-pay | Admitting: Internal Medicine

## 2015-03-27 ENCOUNTER — Other Ambulatory Visit: Payer: Self-pay | Admitting: Internal Medicine

## 2015-04-29 ENCOUNTER — Other Ambulatory Visit: Payer: Self-pay | Admitting: Internal Medicine

## 2015-04-29 ENCOUNTER — Other Ambulatory Visit: Payer: Medicare Other

## 2015-04-29 ENCOUNTER — Ambulatory Visit (INDEPENDENT_AMBULATORY_CARE_PROVIDER_SITE_OTHER)
Admission: RE | Admit: 2015-04-29 | Discharge: 2015-04-29 | Disposition: A | Discharge status: Home / Self Care | Payer: Medicare Other | Source: Ambulatory Visit | Attending: Internal Medicine | Admitting: Internal Medicine

## 2015-04-29 ENCOUNTER — Encounter: Payer: Self-pay | Admitting: Internal Medicine

## 2015-04-29 ENCOUNTER — Ambulatory Visit (INDEPENDENT_AMBULATORY_CARE_PROVIDER_SITE_OTHER): Payer: Medicare Other | Admitting: Internal Medicine

## 2015-04-29 VITALS — BP 120/78 | HR 61 | Temp 98.3°F | Resp 16 | Wt 146.0 lb

## 2015-04-29 DIAGNOSIS — J189 Pneumonia, unspecified organism: Secondary | ICD-10-CM | POA: Diagnosis not present

## 2015-04-29 MED ORDER — HYDROCODONE-HOMATROPINE 5-1.5 MG/5ML PO SYRP: 5.0000 mL | ORAL_SOLUTION | Freq: Four times a day (QID) | ORAL | Status: DC | PRN

## 2015-04-29 MED ORDER — AMOXICILLIN-POT CLAVULANATE 875-125 MG PO TABS: 1.0000 | ORAL_TABLET | Freq: Two times a day (BID) | ORAL | Status: DC

## 2015-04-29 MED ORDER — PREDNISONE 20 MG PO TABS: 20.0000 mg | ORAL_TABLET | Freq: Two times a day (BID) | ORAL | Status: DC

## 2015-04-29 NOTE — Patient Instructions (Signed)
Fill the  prescription for antibiotic if instructed to do so based on Xray findings. Please  blowup at least 10  balloons a day to enhance inflation of the lungs and prevent atelectasis as we discussed.

## 2015-04-29 NOTE — Progress Notes (Signed)
Pre visit review using our clinic review tool, if applicable. No additional management support is needed unless otherwise documented below in the visit note. 

## 2015-04-29 NOTE — Progress Notes (Signed)
   Subjective:    Patient ID: Audrey [name], female    DOB: [birth year], 69 y.o.   MRN: [id number]  HPI   She was seen 04/23/15 at [facility] with fever, chills , otic pain & paroxysmal cough. Pneumonia was diagnosed and she was placed on a Z-Pak and albuterol. She is significantly improved but continues to have a paroxysmal cough which is nonproductive.  She denies any upper respiratory tract or extrinsic symptoms at this time.    Review of Systems Frontal headache, facial pain , nasal purulence, dental pain, sore throat , otic pain or otic discharge denied @ this time. No fever , chills or sweats. Extrinsic symptoms of itchy, watery eyes, sneezing, or angioedema are denied. There is no  sputum production, wheezing,or  paroxysmal nocturnal dyspnea.     Objective:   Physical Exam General appearance:Adequately nourished; no acute distress or increased work of breathing is present.    Lymphatic: No  lymphadenopathy about the head, neck, or axilla .  Eyes: No conjunctival inflammation or lid edema is present. There is no scleral icterus.  Ears:  External ear exam shows no significant lesions or deformities.  Otoscopic examination reveals clear canals, tympanic membranes are intact bilaterally without bulging, retraction, inflammation or discharge.  Nose:  External nasal examination shows no deformity or inflammation. Nasal mucosa are pink and moist without lesions or exudates No septal dislocation or deviation.No obstruction to airflow.   Oral exam: Dental hygiene is good; lips and gums are healthy appearing.There is no oropharyngeal erythema or exudate .  Neck:  No deformities, thyromegaly, masses, or tenderness noted.   Supple with full range of motion without pain.   Heart:  Normal rate and regular rhythm. S1 and S2 normal without gallop, murmur, click, rub or other extra sounds.   Lungs: Mild rales noted at the left lower lobe. She has no increased work of breathing. She  has a paroxysmal, intermittent dry cough  Extremities:  No cyanosis, edema, or clubbing  noted    Skin: Warm & dry w/o tenting or jaundice. No significant lesions or rash.         Assessment & Plan:  #1 community-acquired pneumonia  Plan: See orders and recommendations

## 2015-05-08 ENCOUNTER — Ambulatory Visit (INDEPENDENT_AMBULATORY_CARE_PROVIDER_SITE_OTHER): Payer: Medicare Other | Admitting: Internal Medicine

## 2015-05-08 ENCOUNTER — Encounter: Payer: Self-pay | Admitting: Internal Medicine

## 2015-05-08 ENCOUNTER — Ambulatory Visit (INDEPENDENT_AMBULATORY_CARE_PROVIDER_SITE_OTHER)
Admission: RE | Admit: 2015-05-08 | Discharge: 2015-05-08 | Disposition: A | Discharge status: Home / Self Care | Payer: Medicare Other | Source: Ambulatory Visit | Attending: Internal Medicine | Admitting: Internal Medicine

## 2015-05-08 VITALS — BP 102/68 | HR 57 | Temp 97.9°F | Resp 18 | Wt 147.0 lb

## 2015-05-08 DIAGNOSIS — J189 Pneumonia, unspecified organism: Secondary | ICD-10-CM

## 2015-05-08 NOTE — Progress Notes (Signed)
   Subjective:    Patient ID: Audrey [name], female    DOB: [birth year], 69 y.o.   MRN: [id number]  HPI   She feels that she is completely recovered from the pneumonia. She has an intermittent minor "throat cough". She denies any other constitutional symptoms or symptoms or signs of respiratory tract infection.   The follow-up film was of concern for ill defined right hilar  infiltrate versus mass.  She's never smoked. Her father had emphysema. There is no other personal or family history of lung disease.   Review of Systems Frontal headache, facial pain , nasal purulence, dental pain, sore throat , otic pain or otic discharge denied. No fever , chills or sweats. Extrinsic symptoms of itchy, watery eyes, sneezing, or angioedema are denied. There is no significant cough, sputum production, wheezing,or  paroxysmal nocturnal dyspnea.     Objective:   Physical Exam  General appearance:Adequately nourished; no acute distress or increased work of breathing is present.    Lymphatic: No  lymphadenopathy about the head, neck, or axilla .  Eyes: No conjunctival inflammation or lid edema is present. There is no scleral icterus.  Ears:  External ear exam shows no significant lesions or deformities.  Otoscopic examination reveals clear canals, tympanic membranes are intact bilaterally without bulging, retraction, inflammation or discharge.  Nose:  External nasal examination shows no deformity or inflammation. Nasal mucosa are pink and moist without lesions or exudates No septal dislocation or deviation.No obstruction to airflow.   Oral exam: Dental hygiene is good; lips and gums are healthy appearing.There is no oropharyngeal erythema or exudate .  Neck:  No deformities, thyromegaly, masses, or tenderness noted.   Supple with full range of motion without pain.   Heart:  Normal rate and regular rhythm. S1 and S2 normal without gallop, murmur, click, rub or other extra sounds.   Lungs:Chest  clear to auscultation; no wheezes, rhonchi,rales ,or rubs present.  Extremities:  No cyanosis, edema, or clubbing  noted    Skin: Warm & dry w/o tenting or jaundice. No significant lesions or rash.        Assessment & Plan:  #1 CAP, clinically resolved See orders

## 2015-05-08 NOTE — Patient Instructions (Signed)
Carry room temperature water and sip liberally after coughing.   Your next office appointment will be determined based upon review of your pending  xrays  Those written interpretation of the lab results and instructions will be transmitted to you by My Chart Critical results will be called. Followup as needed for any active or acute issue. Please report any significant change in your symptoms.

## 2015-05-08 NOTE — Progress Notes (Signed)
Pre visit review using our clinic review tool, if applicable. No additional management support is needed unless otherwise documented below in the visit note. 

## 2015-05-19 ENCOUNTER — Other Ambulatory Visit: Payer: Self-pay | Admitting: Internal Medicine

## 2015-05-19 ENCOUNTER — Telehealth: Payer: Self-pay | Admitting: Internal Medicine

## 2015-05-19 DIAGNOSIS — H9190 Unspecified hearing loss, unspecified ear: Secondary | ICD-10-CM

## 2015-05-19 NOTE — Telephone Encounter (Signed)
The Audiology referral will be scheduled and you'll be notified of the time.Please call the Referral Co-Ordinator @ [phone] if you have not been notified of appointment time within 7-10 days.

## 2015-05-19 NOTE — Telephone Encounter (Signed)
Patient is requesting a referral to audiology. She does not have a provider in mind at this point.

## 2015-05-19 NOTE — Telephone Encounter (Signed)
Please advise 

## 2015-05-19 NOTE — Telephone Encounter (Signed)
Informed pt .

## 2015-06-27 ENCOUNTER — Other Ambulatory Visit: Payer: Self-pay | Admitting: Internal Medicine

## 2015-06-29 ENCOUNTER — Other Ambulatory Visit: Payer: Self-pay | Admitting: Emergency Medicine

## 2015-06-29 DIAGNOSIS — G4710 Hypersomnia, unspecified: Secondary | ICD-10-CM

## 2015-06-29 MED ORDER — FLUOXETINE HCL 20 MG PO CAPS: 20.0000 mg | ORAL_CAPSULE | Freq: Every day | ORAL | Status: DC

## 2015-07-16 ENCOUNTER — Telehealth: Payer: Self-pay | Admitting: Emergency Medicine

## 2015-07-16 ENCOUNTER — Other Ambulatory Visit: Payer: Self-pay | Admitting: Internal Medicine

## 2015-07-16 ENCOUNTER — Other Ambulatory Visit: Payer: Self-pay | Admitting: Emergency Medicine

## 2015-07-16 MED ORDER — DICLOFENAC SODIUM 75 MG PO TBEC: DELAYED_RELEASE_TABLET | ORAL | Status: DC

## 2015-07-16 NOTE — Telephone Encounter (Signed)
OK #14; 1 bid prn only after meals This medication is among those which experts have documented to have a very  high risk of causing gastritis and bleeding if used on regular basis Short term use @ lowest possible doses

## 2015-07-16 NOTE — Telephone Encounter (Signed)
Refill request from pharmacy, Diclofenac. Pt has not received this medication since 2014. Please advise.

## 2015-09-27 ENCOUNTER — Other Ambulatory Visit: Payer: Self-pay | Admitting: Internal Medicine

## 2015-09-28 NOTE — Telephone Encounter (Signed)
Pt requesting refill of atorvastatin. Last lipid panel was done in 2015. She has an appointment with you on 09/30/15 but it is an acute appointment. You have not seen pt before. Please advise on refill.

## 2015-09-28 NOTE — Telephone Encounter (Signed)
Ok to refill - sent

## 2015-09-30 ENCOUNTER — Ambulatory Visit (INDEPENDENT_AMBULATORY_CARE_PROVIDER_SITE_OTHER): Payer: Medicare Other | Admitting: Internal Medicine

## 2015-09-30 ENCOUNTER — Encounter: Payer: Self-pay | Admitting: Internal Medicine

## 2015-09-30 VITALS — BP 118/80 | HR 60 | Temp 98.4°F | Resp 16 | Wt 146.0 lb

## 2015-09-30 DIAGNOSIS — M79644 Pain in right finger(s): Secondary | ICD-10-CM

## 2015-09-30 NOTE — Patient Instructions (Signed)
Try an anti-inflammatory such as advil or aleve or your thumb pain.  Take with food and try not to take long term.   If you a referral to an orthopedic let me know.

## 2015-09-30 NOTE — Progress Notes (Signed)
Subjective:    Patient ID: Audrey [name], female    DOB: October 01, 1945, 70 y.o.   MRN: [id number]  HPI She is here for an acute visit for thumb popping and discomfort.  Right thumb pain:  She has had some mild pain in her right DIP thumb joint in the past, but mild only.  She has arthritis in several of her finger joints.  A few weeks ago she started to having that joint lock and it was painful to unlock it.  It seems to be worse first thing in the morning.  She denies obvious swelling and there is no weakness, numbness or tingling.  It is getting worse.  She has not tried anything such as heat, cold, tylenol or nsaids.  Medications and allergies reviewed with patient and updated if appropriate.  Patient Active Problem List   Diagnosis Date Noted  . Hypothyroidism 02/07/2015  . Arthralgia of multiple joints 05/28/2014  . Vitamin D deficiency 05/09/2012  . MUSCLE PAIN 05/26/2010  . OSTEOPENIA 05/26/2010  . Hypersomnia 02/22/2010  . Hyperlipidemia 01/21/2010  . Diverticulosis of large intestine 01/21/2010  . POLYARTHRITIS 01/21/2010  . 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 SKIN CANCER, HX OF 01/21/2010    Current Outpatient Prescriptions on File Prior to Visit  Medication Sig Dispense Refill  . atorvastatin (LIPITOR) 20 MG tablet TAKE 1 TAB BY MOUTH ONCE DAILY 90 tablet 1  . Calcium Carbonate-Vitamin D (CALCIUM + D PO) Take 1,200 mg by mouth daily.    . cholecalciferol (VITAMIN D) 1000 UNITS tablet Take 1,000 Units by mouth every other day.      . diclofenac (VOLTAREN) 75 MG EC tablet Take by mouth 2 times daily AS NEEDED. Should be taken as infrequently as possible. 14 tablet 0  . FLUoxetine (PROZAC) 20 MG capsule Take 1 capsule (20 mg total) by mouth daily. 90 capsule 1  . levothyroxine (SYNTHROID, LEVOTHROID) 25 MCG tablet TAKE ONE TABLET BY MOUTH EVERY DAY BEFORE BREAKFAST. 90 tablet 1  . traMADol (ULTRAM) 50 MG tablet Take 1 tablet (50 mg total) by mouth every  6 (six) hours as needed. 30 tablet 1  . triamcinolone (NASACORT) 55 MCG/ACT AERO nasal inhaler Place 1 spray into the nose daily.     Current Facility-Administered Medications on File Prior to Visit  Medication Dose Route Frequency Provider Last Rate Last Dose  . 0.9 %  sodium chloride infusion  500 mL Intravenous Continuous Irene Shipper, MD        Past Medical History  Diagnosis Date  . Arthritis   . Hyperlipidemia   . 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 Allergy     seasonal allegies; seem worse this year    Past Surgical History  Procedure Laterality Date  . Colonoscopy  2012    neg; [facility]  . Hemorrhoid surgery      Social History   Social History  . Marital Status: Married    Spouse Name: N/A  . Number of Children: N/A  . Years of Education: N/A   Social History Main Topics  . Smoking status: Never Smoker   . Smokeless tobacco: Never Used  . Alcohol Use: No  . Drug Use: No  . Sexual Activity: Not Asked   Other Topics Concern  . None   Social History Narrative   Lives with spouse;   One son in [place]   Dtr in [place]  2 grand children   2 twin girl; in [place] in July (11)    Family History  Problem Relation Age of Onset  . Stroke Maternal Grandmother   . Diabetes Neg Hx   . Heart attack Mother     in 31s  . Heart attack Father     in 47s  . Heart attack Maternal Grandfather     in 39s  . Coronary artery disease Brother     stent in 57s  . Uterine cancer Mother   . Other Brother     ? leukemia    Review of Systems See HPI    Objective:   Filed Vitals:   09/30/15 1125  BP: 118/80  Pulse: 60  Temp: 98.4 F (36.9 C)  Resp: 16   Filed Weights   09/30/15 1125  Weight: 146 lb (66.225 kg)   Body mass index is 25.05 kg/(m^2).   Physical Exam  Constitutional: She appears well-developed and well-nourished.  Musculoskeletal: Normal range of motion. She exhibits no edema.  Right thumb no deformity or  swelling.  No tenderness with palpation of right thumb/DIP joint, normal range of motion of thumb and DIP joint, thumb does lock with flexion and it does cause some pain, normal sensation          Assessment & Plan:   Right thumb DIP pain and locking Likely secondary to arthritis Discussed use of nsaids - short term only - take with food Can try ice, heat Can try natural anti-oxidant supplement such as tumeric  If no improvement or worsening she will let me know - will refer to ortho hand

## 2015-09-30 NOTE — Progress Notes (Signed)
Pre visit review using our clinic review tool, if applicable. No additional management support is needed unless otherwise documented below in the visit note. 

## 2015-12-29 LAB — HM DEXA SCAN

## 2015-12-29 LAB — HM MAMMOGRAPHY

## 2015-12-30 ENCOUNTER — Ambulatory Visit: Payer: Medicare Other | Admitting: Internal Medicine

## 2015-12-30 ENCOUNTER — Other Ambulatory Visit: Payer: Self-pay

## 2015-12-30 DIAGNOSIS — G4710 Hypersomnia, unspecified: Secondary | ICD-10-CM

## 2015-12-30 MED ORDER — FLUOXETINE HCL 20 MG PO CAPS: 20.0000 mg | ORAL_CAPSULE | Freq: Every day | ORAL | Status: DC

## 2015-12-31 ENCOUNTER — Encounter: Payer: Self-pay | Admitting: Internal Medicine

## 2016-01-01 ENCOUNTER — Encounter: Payer: Self-pay | Admitting: Internal Medicine

## 2016-01-03 ENCOUNTER — Encounter: Payer: Self-pay | Admitting: Internal Medicine

## 2016-01-14 ENCOUNTER — Other Ambulatory Visit: Payer: Self-pay | Admitting: Emergency Medicine

## 2016-01-14 MED ORDER — DICLOFENAC SODIUM 75 MG PO TBEC: DELAYED_RELEASE_TABLET | ORAL | Status: DC

## 2016-02-22 ENCOUNTER — Other Ambulatory Visit: Payer: Self-pay | Admitting: *Deleted

## 2016-02-22 MED ORDER — LEVOTHYROXINE SODIUM 25 MCG PO TABS: ORAL_TABLET | ORAL | Status: DC

## 2016-03-23 ENCOUNTER — Other Ambulatory Visit: Payer: Self-pay | Admitting: Internal Medicine

## 2016-03-27 ENCOUNTER — Other Ambulatory Visit: Payer: Self-pay | Admitting: Internal Medicine

## 2016-03-27 DIAGNOSIS — G4710 Hypersomnia, unspecified: Secondary | ICD-10-CM

## 2016-06-21 ENCOUNTER — Telehealth: Payer: Self-pay | Admitting: Internal Medicine

## 2016-06-21 MED ORDER — TYPHOID VACCINE PO CPDR: 1.0000 | DELAYED_RELEASE_CAPSULE | ORAL | 0 refills | Status: DC

## 2016-06-21 NOTE — Telephone Encounter (Signed)
Patient is going to [place].  She is requesting four typhoid capsules (vivotif)

## 2016-06-21 NOTE — Telephone Encounter (Signed)
LVM informing pt

## 2016-06-21 NOTE — Telephone Encounter (Signed)
Sent to pharmacy 

## 2016-06-21 NOTE — Telephone Encounter (Signed)
Please advise 

## 2016-07-02 ENCOUNTER — Other Ambulatory Visit: Payer: Self-pay | Admitting: Internal Medicine

## 2016-07-02 DIAGNOSIS — G4710 Hypersomnia, unspecified: Secondary | ICD-10-CM

## 2016-07-06 ENCOUNTER — Encounter: Payer: Self-pay | Admitting: Internal Medicine

## 2016-07-06 ENCOUNTER — Ambulatory Visit (INDEPENDENT_AMBULATORY_CARE_PROVIDER_SITE_OTHER): Payer: Medicare Other | Admitting: Internal Medicine

## 2016-07-06 VITALS — BP 110/74 | HR 64 | Temp 98.1°F | Resp 16 | Ht 64.0 in | Wt 147.0 lb

## 2016-07-06 DIAGNOSIS — M85862 Other specified disorders of bone density and structure, left lower leg: Secondary | ICD-10-CM

## 2016-07-06 DIAGNOSIS — M2550 Pain in unspecified joint: Secondary | ICD-10-CM | POA: Diagnosis not present

## 2016-07-06 DIAGNOSIS — E039 Hypothyroidism, unspecified: Secondary | ICD-10-CM | POA: Diagnosis not present

## 2016-07-06 DIAGNOSIS — E7800 Pure hypercholesterolemia, unspecified: Secondary | ICD-10-CM | POA: Diagnosis not present

## 2016-07-06 DIAGNOSIS — Z1159 Encounter for screening for other viral diseases: Secondary | ICD-10-CM

## 2016-07-06 DIAGNOSIS — M85861 Other specified disorders of bone density and structure, right lower leg: Secondary | ICD-10-CM

## 2016-07-06 DIAGNOSIS — E559 Vitamin D deficiency, unspecified: Secondary | ICD-10-CM

## 2016-07-06 DIAGNOSIS — G4710 Hypersomnia, unspecified: Secondary | ICD-10-CM

## 2016-07-06 DIAGNOSIS — M4716 Other spondylosis with myelopathy, lumbar region: Secondary | ICD-10-CM

## 2016-07-06 MED ORDER — FLUOXETINE HCL 20 MG PO CAPS: 20.0000 mg | ORAL_CAPSULE | Freq: Every day | ORAL | 3 refills | Status: DC

## 2016-07-06 MED ORDER — DICLOFENAC SODIUM 75 MG PO TBEC: DELAYED_RELEASE_TABLET | ORAL | 3 refills | Status: DC

## 2016-07-06 NOTE — Assessment & Plan Note (Signed)
Continue aleve pain Takes diclofenac as needed

## 2016-07-06 NOTE — Assessment & Plan Note (Signed)
Continue prozac. 

## 2016-07-06 NOTE — Assessment & Plan Note (Signed)
Check D level 

## 2016-07-06 NOTE — Assessment & Plan Note (Signed)
dexa up to date Taking calcium and vitamin d Check vitamin D level Stressed regular exercise

## 2016-07-06 NOTE — Progress Notes (Signed)
Pre visit review using our clinic review tool, if applicable. No additional management support is needed unless otherwise documented below in the visit note. 

## 2016-07-06 NOTE — Progress Notes (Signed)
Subjective:    Patient ID: Audrey [name], female    DOB: 07/17/1946, 70 y.o.   MRN: [id number]  HPI The patient is here for follow up.  Hypothyroidism:  She is taking her medication daily.  She denies any recent changes in energy or weight that are unexplained.   Hyperlipidemia: She is taking her medication daily. She is compliant with a low fat/cholesterol diet. She is not exercising regularly. She denies myalgias.   Chronic back pain, Arthritis:  She has pain in multiple joints and especially her back.  She is taking diclofenac only as needed.  She has been taking aleve daily.    Medications and allergies reviewed with patient and updated if appropriate.  Patient Active Problem List   Diagnosis Date Noted  . Hypothyroidism 02/07/2015  . Arthralgia of multiple joints 05/28/2014  . Vitamin D deficiency 05/09/2012  . MUSCLE PAIN 05/26/2010  . OSTEOPENIA 05/26/2010  . Hypersomnia 02/22/2010  . Hyperlipidemia 01/21/2010  . Diverticulosis of large intestine 01/21/2010  . 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 SKIN CANCER, HX OF 01/21/2010    Current Outpatient Prescriptions on File Prior to Visit  Medication Sig Dispense Refill  . atorvastatin (LIPITOR) 20 MG tablet TAKE 1 TABLET BY MOUTH DAILY 90 tablet 1  . Calcium Carbonate-Vitamin D (CALCIUM + D PO) Take 1,200 mg by mouth daily.    . cholecalciferol (VITAMIN D) 1000 UNITS tablet Take 1,000 Units by mouth every other day.      . diclofenac (VOLTAREN) 75 MG EC tablet Take by mouth 2 times daily AS NEEDED. Should be taken as infrequently as possible. 14 tablet 0  . FLUoxetine (PROZAC) 20 MG capsule Take 1 capsule (20 mg total) by mouth daily. 90 capsule 0  . levothyroxine (SYNTHROID, LEVOTHROID) 25 MCG tablet TAKE ONE TABLET BY MOUTH EVERY DAY BEFORE BREAKFAST. 90 tablet 2  . traMADol (ULTRAM) 50 MG tablet Take 1 tablet (50 mg total) by mouth every 6 (six) hours as needed. 30 tablet 1  . triamcinolone  (NASACORT) 55 MCG/ACT AERO nasal inhaler Place 1 spray into the nose daily.     No current facility-administered medications on file prior to visit.     Past Medical History:  Diagnosis Date  . Allergy    seasonal allegies; seem worse this year  . Arthritis   . 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 Hyperlipidemia     Past Surgical History:  Procedure Laterality Date  . COLONOSCOPY  2012   neg; [facility]  . HEMORRHOID SURGERY      Social History   Social History  . Marital status: Married    Spouse name: N/A  . Number of children: N/A  . Years of education: N/A   Social History Main Topics  . Smoking status: Never Smoker  . Smokeless tobacco: Never Used  . Alcohol use No  . Drug use: No  . Sexual activity: Not Asked   Other Topics Concern  . None   Social History Narrative   Lives with spouse;   One son in [place]   Dtr in [place]   2 grand children   2 twin girl; in [place] in July (11)    Family History  Problem Relation Age of Onset  . Stroke Maternal Grandmother   . Diabetes Neg Hx   . Heart attack Mother     in 90s  . Heart attack Father  in 73s  . Heart attack Maternal Grandfather     in 65s  . Coronary artery disease Brother     stent in 30s  . Uterine cancer Mother   . Other Brother     ? leukemia    Review of Systems  Constitutional: Negative for chills, fatigue, fever and unexpected weight change.  Respiratory: Negative for cough, shortness of breath and wheezing.   Cardiovascular: Negative for chest pain, palpitations and leg swelling.  Gastrointestinal: Negative for abdominal pain.       Occ gerd  Musculoskeletal: Positive for back pain. Negative for myalgias.  Neurological: Negative for light-headedness and headaches.       Objective:   Vitals:   07/06/16 0826  BP: 110/74  Pulse: 64  Resp: 16  Temp: 98.1 F (36.7 C)   Filed Weights   07/06/16 0826  Weight: 147 lb (66.7 kg)   Body mass  index is 25.23 kg/m.   Physical Exam    Constitutional: Appears well-developed and well-nourished. No distress.  HENT:  Head: Normocephalic and atraumatic.  Neck: Neck supple. No tracheal deviation present. No thyromegaly present.  No cervical lymphadenopathy Cardiovascular: Normal rate, regular rhythm and normal heart sounds.   No murmur heard. No carotid bruit .  No edema Pulmonary/Chest: Effort normal and breath sounds normal. No respiratory distress. No has no wheezes. No rales.  Skin: Skin is warm and dry. Not diaphoretic.  Psychiatric: Normal mood and affect. Behavior is normal.     Assessment & Plan:   Discussed shingles vaccine  See Problem List for Assessment and Plan of chronic medical problems.   F/u in one year

## 2016-07-06 NOTE — Assessment & Plan Note (Signed)
Check lipids, cmp Continue statin 

## 2016-07-06 NOTE — Assessment & Plan Note (Signed)
Check tsh  Titrate med dose if needed  

## 2016-07-06 NOTE — Patient Instructions (Addendum)
  Test(s) ordered today. Your results will be released to [place] (or called to you) after review, usually within 72hours after test completion. If any changes need to be made, you will be notified at that same time.  All other Health Maintenance issues reviewed.   All recommended immunizations and age-appropriate screenings are up-to-date or discussed.  No immunizations administered today.   Medications reviewed and updated.  No changes recommended at this time.  Your prescription(s) have been submitted to your pharmacy. Please take as directed and contact our office if you believe you are having problem(s) with the medication(s).  Please followup in one year for a physical

## 2016-07-06 NOTE — Assessment & Plan Note (Addendum)
stabbing pain in lower back, no radiating pain - daily pain Taking one aleve daily which works well Takes diclofenac only as needed for more severe pain Continue above - discussed concerns with long term nsaid use

## 2016-07-08 ENCOUNTER — Other Ambulatory Visit (INDEPENDENT_AMBULATORY_CARE_PROVIDER_SITE_OTHER): Payer: Medicare Other

## 2016-07-08 DIAGNOSIS — E039 Hypothyroidism, unspecified: Secondary | ICD-10-CM | POA: Diagnosis not present

## 2016-07-08 DIAGNOSIS — E559 Vitamin D deficiency, unspecified: Secondary | ICD-10-CM | POA: Diagnosis not present

## 2016-07-08 DIAGNOSIS — E7800 Pure hypercholesterolemia, unspecified: Secondary | ICD-10-CM

## 2016-07-08 DIAGNOSIS — Z1159 Encounter for screening for other viral diseases: Secondary | ICD-10-CM

## 2016-07-08 LAB — COMPREHENSIVE METABOLIC PANEL
ALT: 18 U/L (ref 0–35)
AST: 16 U/L (ref 0–37)
Albumin: 4.3 g/dL (ref 3.5–5.2)
Alkaline Phosphatase: 56 U/L (ref 39–117)
BILIRUBIN TOTAL: 0.4 mg/dL (ref 0.2–1.2)
BUN: 18 mg/dL (ref 6–23)
CALCIUM: 9.9 mg/dL (ref 8.4–10.5)
CHLORIDE: 107 meq/L (ref 96–112)
CO2: 29 meq/L (ref 19–32)
Creatinine, Ser: 0.71 mg/dL (ref 0.40–1.20)
GFR: 86.12 mL/min (ref >60.00)
GLUCOSE: 88 mg/dL (ref 70–99)
POTASSIUM: 4.7 meq/L (ref 3.5–5.1)
Sodium: 141 mEq/L (ref 135–145)
Total Protein: 6.8 g/dL (ref 6.0–8.3)

## 2016-07-08 LAB — LIPID PANEL
CHOL/HDL RATIO: 3
Cholesterol: 173 mg/dL (ref 0–200)
HDL: 61.8 mg/dL (ref >39.00)
LDL CALC: 87 mg/dL (ref 0–99)
NonHDL: 111.62
TRIGLYCERIDES: 122 mg/dL (ref 0.0–149.0)
VLDL: 24.4 mg/dL (ref 0.0–40.0)

## 2016-07-08 LAB — CBC WITH DIFFERENTIAL/PLATELET
BASOS PCT: 0.7 % (ref 0.0–3.0)
Basophils Absolute: 0 10*3/uL (ref 0.0–0.1)
Eosinophils Absolute: 0.1 10*3/uL (ref 0.0–0.7)
Eosinophils Relative: 2.3 % (ref 0.0–5.0)
HEMATOCRIT: 39.3 % (ref 36.0–46.0)
Hemoglobin: 13.1 g/dL (ref 12.0–15.0)
LYMPHS ABS: 1.7 10*3/uL (ref 0.7–4.0)
LYMPHS PCT: 36.7 % (ref 12.0–46.0)
MCHC: 33.4 g/dL (ref 30.0–36.0)
MCV: 91.6 fl (ref 78.0–100.0)
MONOS PCT: 7.1 % (ref 3.0–12.0)
Monocytes Absolute: 0.3 10*3/uL (ref 0.1–1.0)
NEUTROS ABS: 2.5 10*3/uL (ref 1.4–7.7)
NEUTROS PCT: 53.2 % (ref 43.0–77.0)
PLATELETS: 239 10*3/uL (ref 150.0–400.0)
RBC: 4.3 Mil/uL (ref 3.87–5.11)
RDW: 13.8 % (ref 11.5–15.5)
WBC: 4.6 10*3/uL (ref 4.0–10.5)

## 2016-07-08 LAB — VITAMIN D 25 HYDROXY (VIT D DEFICIENCY, FRACTURES): VITD: 32.91 ng/mL (ref 30.00–100.00)

## 2016-07-08 LAB — TSH: TSH: 3.92 u[IU]/mL (ref 0.35–4.50)

## 2016-07-09 ENCOUNTER — Encounter: Payer: Self-pay | Admitting: Internal Medicine

## 2016-07-09 LAB — HEPATITIS C ANTIBODY: HCV Ab: NEGATIVE

## 2016-08-15 ENCOUNTER — Other Ambulatory Visit: Payer: Self-pay | Admitting: Internal Medicine

## 2016-09-15 ENCOUNTER — Other Ambulatory Visit: Payer: Self-pay | Admitting: Internal Medicine

## 2016-09-20 ENCOUNTER — Encounter: Payer: Self-pay | Admitting: Geriatric Medicine

## 2016-10-11 ENCOUNTER — Other Ambulatory Visit: Payer: Self-pay | Admitting: Internal Medicine

## 2017-03-16 ENCOUNTER — Other Ambulatory Visit: Payer: Self-pay | Admitting: Emergency Medicine

## 2017-03-16 MED ORDER — ATORVASTATIN CALCIUM 20 MG PO TABS: 20.0000 mg | ORAL_TABLET | Freq: Every day | ORAL | 1 refills | Status: DC

## 2017-03-17 ENCOUNTER — Other Ambulatory Visit: Payer: Self-pay | Admitting: Internal Medicine

## 2017-04-14 ENCOUNTER — Other Ambulatory Visit: Payer: Self-pay | Admitting: Internal Medicine

## 2017-04-17 ENCOUNTER — Telehealth: Payer: Self-pay | Admitting: Internal Medicine

## 2017-04-17 NOTE — Telephone Encounter (Signed)
Called pt to schedule AWV for same day as CPE on 07/11/17. Lvm for pt to call office to schedule appt.

## 2017-07-09 NOTE — Progress Notes (Signed)
Subjective:    Patient ID: Audrey [name], female    DOB: 1946/01/11, 71 y.o.   MRN: [id number]  HPI Here for medicare wellness exam and annual physical exam.   I have personally reviewed and have noted 1.The patient's medical and social history 2.Their use of alcohol, tobacco or illicit drugs 3.Their current medications and supplements 4.The patient's functional ability including ADL's, fall risks, home                 safety risk and hearing or visual impairment. 5.Diet and physical activities 6.Evidence for depression or mood disorders 7.Care team reviewed  -  [place], FNP, Eye doctor, derm - dan jones -  derm    Are there smokers in your home (other than you)? No  Risk Factors Exercise:  No regular exercise, active with ADLs Dietary issues discussed: eats everything, avoids sweets, yogurt a lot, cooks mostly at home  Vitamin and supplement use: calcium and vitamin d  Opiod use:  none Side effects from medication:  n/a Does medications benefits outweigh risks/side effects: n/a  Cardiac risk factors: advanced age, hyperlipidemia  Depression Screen  Have you felt down, depressed or hopeless? No  Have you felt little interest or pleasure in doing things?  No  Activities of Daily Living In your present state of health, do you have any difficulty performing the following activities?:  Driving? No Managing money?  No Feeding yourself? No Getting from bed to chair? No Climbing a flight of stairs? No Preparing food and eating?: No Bathing or showering? No Getting dressed: No Getting to/using the toilet? No Moving around from place to place: No In the past year have you fallen or had a near fall?: yes - fell on front porch at daughter's house - reached down to put purse down  - fell forward and went down stairs   Do you have more than one partner?  No   Hearing Difficulties:  yes - wears hearing aides intermittently Do you often ask people to speak up or repeat themselves? No Do you experience ringing or noises in your ears? No Do you have difficulty understanding soft or whispered voices? No Vision:              Any change in vision:  yes             Up to date with eye exam:  Up to date    Memory:  Do you feel that you have a problem with memory? No  Do you often misplace items? No  Do you feel safe at home?  Yes  Cognitive Testing  Alert, Orientated? Yes  Normal Appearance? Yes  Recall of three objects?  Yes  Can perform simple calculations? Yes  Displays appropriate judgment? Yes  Can read the correct time from a watch face? Yes   Advanced Directives have been discussed with the patient? Yes -  In place     Medications and allergies reviewed with patient and updated if appropriate.  Patient Active Problem List   Diagnosis Date Noted  . Hypothyroidism 02/07/2015  . Arthralgia of multiple joints 05/28/2014  . Vitamin D deficiency 05/09/2012  . Osteopenia 05/26/2010  . Hypersomnia 02/22/2010  . Hyperlipidemia 01/21/2010  . Diverticulosis of large intestine 01/21/2010  . 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 SKIN CANCER, HX OF 01/21/2010    Current Outpatient Medications on File Prior to Visit  Medication Sig Dispense Refill  . atorvastatin (  LIPITOR) 20 MG tablet Take 1 tablet (20 mg total) by mouth daily. 90 tablet 1  . Calcium Carbonate-Vitamin D (CALCIUM + D PO) Take 1,200 mg by mouth daily.    . cholecalciferol (VITAMIN D) 1000 UNITS tablet Take 1,000 Units by mouth every other day.      . diclofenac (VOLTAREN) 75 MG EC tablet Take by mouth 2 times daily AS NEEDED. Should be taken as infrequently as possible. 60 tablet 3  . FLUoxetine (PROZAC) 20 MG capsule Take 1 capsule (20 mg total) by mouth daily. 90 capsule 3  . levothyroxine (SYNTHROID, LEVOTHROID) 25 MCG tablet TAKE ONE TABLET BY MOUTH EVERY DAY BEFORE BREAKFAST. 90  tablet 0  . triamcinolone (NASACORT) 55 MCG/ACT AERO nasal inhaler Place 1 spray into the nose daily.     No current facility-administered medications on file prior to visit.     Past Medical History:  Diagnosis Date  . Allergy    seasonal allegies; seem worse this year  . Arthritis   . 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 Hyperlipidemia     Past Surgical History:  Procedure Laterality Date  . COLONOSCOPY  2012   neg; [facility]  . HEMORRHOID SURGERY      Social History   Socioeconomic History  . Marital status: Married    Spouse name: Not on file  . Number of children: Not on file  . Years of education: Not on file  . Highest education level: Not on file  Social Needs  . Financial resource strain: Not on file  . Food insecurity - worry: Not on file  . Food insecurity - inability: Not on file  . Transportation needs - medical: Not on file  . Transportation needs - non-medical: Not on file  Occupational History  . Not on file  Tobacco Use  . Smoking status: Never Smoker  . Smokeless tobacco: Never Used  Substance and Sexual Activity  . Alcohol use: No    Alcohol/week: 0.0 oz  . Drug use: No  . Sexual activity: Not on file  Other Topics Concern  . Not on file  Social History Narrative   Lives with spouse;   One son in [place]   Dtr in [place]   2 grand children   2 twin girl; in [place] in July (11)    Family History  Problem Relation Age of Onset  . Stroke Maternal Grandmother   . Diabetes Neg Hx   . Heart attack Mother        in 67s  . Heart attack Father        in 49s  . Heart attack Maternal Grandfather        in 38s  . Coronary artery disease Brother        stent in 44s  . Uterine cancer Mother   . Other Brother        ? leukemia    Review of Systems  Constitutional: Negative for chills and fever.  HENT: Positive for hearing loss. Negative for tinnitus.   Eyes: Positive for visual disturbance (some age related  to changes).  Respiratory: Positive for shortness of breath (with exertion at times). Negative for cough and wheezing.   Cardiovascular: Positive for palpitations (with some anxiety). Negative for chest pain.  Gastrointestinal: Negative for abdominal pain, blood in stool, constipation, diarrhea and nausea.  Genitourinary: Negative for dysuria and hematuria.  Musculoskeletal: Positive for arthralgias and back pain.  Skin: Negative for color change and rash.  Neurological: Negative for light-headedness and headaches.  Psychiatric/Behavioral: Negative for dysphoric mood. The patient is nervous/anxious.        Objective:   Vitals:   07/11/17 0908  BP: 124/82  Pulse: 81  Resp: 16  Temp: 98.3 F (36.8 C)  SpO2: 97%   Filed Weights   07/11/17 0908  Weight: 149 lb (67.6 kg)   Body mass index is 25.58 kg/m.  Wt Readings from Last 3 Encounters:  07/11/17 149 lb (67.6 kg)  07/06/16 147 lb (66.7 kg)  09/30/15 146 lb (66.2 kg)     Physical Exam Constitutional: She appears well-developed and well-nourished. No distress.  HENT:  Head: Normocephalic and atraumatic.  Right Ear: External ear normal. Normal ear canal and TM Left Ear: External ear normal.  Normal ear canal and TM Mouth/Throat: Oropharynx is clear and moist.  Eyes: Conjunctivae and EOM are normal.  Neck: Neck supple. No tracheal deviation present. No thyromegaly present.  No carotid bruit  Cardiovascular: Normal rate, regular rhythm and normal heart sounds.   No murmur heard.  No edema. Pulmonary/Chest: Effort normal and breath sounds normal. No respiratory distress. She has no wheezes. She has no rales.  Breast: deferred to Gyn Abdominal: Soft. She exhibits no distension. There is no tenderness.  Lymphadenopathy: She has no cervical adenopathy.  Skin: Skin is warm and dry. She is not diaphoretic.  Psychiatric: She has a normal mood and affect. Her behavior is normal.        Assessment & Plan:   Wellness  Exam: Immunizations   Flu and shingrix discussed, others up to date Colonoscopy   Up to date  Mammogram  Up to date  Gyn  Up to date  Dexa   Up to date  Eye exams  Up to date  Hearing loss  Yes - wears hearing aids Memory concerns/difficulties  none Independent of ADLs   fully Stressed the importance of regular exercise   Patient received copy of preventative screening tests/immunizations recommended for the next 5-10 years.    Physical exam: Screening blood work  ordered Immunizations   Flu and shingrix discussed, others up to date Colonoscopy   Up to date  Mammogram  Up to date  Gyn  Up to date  Dexa   Up to date  Eye exams  Up to date  EKG  Last done 2013 -EKG today shows sinus bradycardia at 55 bpm, low voltage.  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  No other changes from prior EKG noted Exercise   none Weight   BMI is good for age  Skin    no concerns Substance abuse  none  See Problem List for Assessment and Plan of chronic medical problems.

## 2017-07-09 NOTE — Patient Instructions (Addendum)
Audrey [name] , Thank you for taking time to come for your Medicare Wellness Visit. I appreciate your ongoing commitment to your health goals. Please review the following plan we discussed and let me know if I can assist you in the future.   These are the goals we discussed: Goals     Start regular exercise      This is a list of the screening recommended for you and due dates:  Health Maintenance  Topic Date Due  . Mammogram  12/28/2017  . DEXA scan (bone density measurement)  12/28/2017  . Colon Cancer Screening  04/06/2021  . Tetanus Vaccine  02/05/2025  . Flu Shot  Completed  .  Hepatitis C: One time screening is recommended by Center for Disease Control  (CDC) for  adults born from [birth year] through 1966.   Completed  . Pneumonia vaccines  Completed   Test(s) ordered today. Your results will be released to [place] (or called to you) after review, usually within 72hours after test completion. If any changes need to be made, you will be notified at that same time.  All other Health Maintenance issues reviewed.   All recommended immunizations and age-appropriate screenings are up-to-date or discussed.  No immunizations administered today.   An EKG was done today.   Medications reviewed and updated.  No changes recommended at this time.  Your prescription(s) have been submitted to your pharmacy. Please take as directed and contact our office if you believe you are having problem(s) with the medication(s).   Please followup in  One year with me;  wellness visit with [facility], Female Adopting a healthy lifestyle and getting preventive care can go a long way to promote health and wellness. Talk with your health care provider about what schedule of regular examinations is right for you. This is a good chance for you to check in with your provider about disease prevention and staying healthy. In between checkups, there are plenty of things you can do on your own. Experts  have done a lot of research about which lifestyle changes and preventive measures are most likely to keep you healthy. Ask your health care provider for more information. Weight and diet Eat a healthy diet  Be sure to include plenty of vegetables, fruits, low-fat dairy products, and lean protein.  Do not eat a lot of foods high in solid fats, added sugars, or salt.  Get regular exercise. This is one of the most important things you can do for your health. ? Most adults should exercise for at least 150 minutes each week. The exercise should increase your heart rate and make you sweat (moderate-intensity exercise). ? Most adults should also do strengthening exercises at least twice a week. This is in addition to the moderate-intensity exercise.  Maintain a healthy weight  Body mass index (BMI) is a measurement that can be used to identify possible weight problems. It estimates body fat based on height and weight. Your health care provider can help determine your BMI and help you achieve or maintain a healthy weight.  For females 29 years of age and older: ? A BMI below 18.5 is considered underweight. ? A BMI of 18.5 to 24.9 is normal. ? A BMI of 25 to 29.9 is considered overweight. ? A BMI of 30 and above is considered obese.  Watch levels of cholesterol and blood lipids  You should start having your blood tested for lipids and cholesterol at 72 years of age,  then have this test every 5 years.  You may need to have your cholesterol levels checked more often if: ? Your lipid or cholesterol levels are high. ? You are older than 72 years of age. ? You are at high risk for heart disease.  Cancer screening Lung Cancer  Lung cancer screening is recommended for adults 61-77 years old who are at high risk for lung cancer because of a history of smoking.  A yearly low-dose CT scan of the lungs is recommended for people who: ? Currently smoke. ? Have quit within the past 15 years. ? Have  at least a 30-pack-year history of smoking. A pack year is smoking an average of one pack of cigarettes a day for 1 year.  Yearly screening should continue until it has been 15 years since you quit.  Yearly screening should stop if you develop a health problem that would prevent you from having lung cancer treatment.  Breast Cancer  Practice breast self-awareness. This means understanding how your breasts normally appear and feel.  It also means doing regular breast self-exams. Let your health care provider know about any changes, no matter how small.  If you are in your 20s or 30s, you should have a clinical breast exam (CBE) by a health care provider every 1-3 years as part of a regular health exam.  If you are 21 or older, have a CBE every year. Also consider having a breast X-ray (mammogram) every year.  If you have a family history of breast cancer, talk to your health care provider about genetic screening.  If you are at high risk for breast cancer, talk to your health care provider about having an MRI and a mammogram every year.  Breast cancer gene (BRCA) assessment is recommended for women who have family members with BRCA-related cancers. BRCA-related cancers include: ? Breast. ? Ovarian. ? Tubal. ? Peritoneal cancers.  Results of the assessment will determine the need for genetic counseling and BRCA1 and BRCA2 testing.  Cervical Cancer Your health care provider may recommend that you be screened regularly for cancer of the pelvic organs (ovaries, uterus, and vagina). This screening involves a pelvic examination, including checking for microscopic changes to the surface of your cervix (Pap test). You may be encouraged to have this screening done every 3 years, beginning at age 79.  For women ages 56-65, health care providers may recommend pelvic exams and Pap testing every 3 years, or they may recommend the Pap and pelvic exam, combined with testing for human papilloma virus  (HPV), every 5 years. Some types of HPV increase your risk of cervical cancer. Testing for HPV may also be done on women of any age with unclear Pap test results.  Other health care providers may not recommend any screening for nonpregnant women who are considered low risk for pelvic cancer and who do not have symptoms. Ask your health care provider if a screening pelvic exam is right for you.  If you have had past treatment for cervical cancer or a condition that could lead to cancer, you need Pap tests and screening for cancer for at least 20 years after your treatment. If Pap tests have been discontinued, your risk factors (such as having a new sexual partner) need to be reassessed to determine if screening should resume. Some women have medical problems that increase the chance of getting cervical cancer. In these cases, your health care provider may recommend more frequent screening and Pap tests.  Colorectal Cancer  This type of cancer can be detected and often prevented.  Routine colorectal cancer screening usually begins at 72 years of age and continues through 72 years of age.  Your health care provider may recommend screening at an earlier age if you have risk factors for colon cancer.  Your health care provider may also recommend using home test kits to check for hidden blood in the stool.  A small camera at the end of a tube can be used to examine your colon directly (sigmoidoscopy or colonoscopy). This is done to check for the earliest forms of colorectal cancer.  Routine screening usually begins at age 21.  Direct examination of the colon should be repeated every 5-10 years through 72 years of age. However, you may need to be screened more often if early forms of precancerous polyps or small growths are found.  Skin Cancer  Check your skin from head to toe regularly.  Tell your health care provider about any new moles or changes in moles, especially if there is a change in a  mole's shape or color.  Also tell your health care provider if you have a mole that is larger than the size of a pencil eraser.  Always use sunscreen. Apply sunscreen liberally and repeatedly throughout the day.  Protect yourself by wearing long sleeves, pants, a wide-brimmed hat, and sunglasses whenever you are outside.  Heart disease, diabetes, and high blood pressure  High blood pressure causes heart disease and increases the risk of stroke. High blood pressure is more likely to develop in: ? People who have blood pressure in the high end of the normal range (130-139/85-89 mm Hg). ? People who are overweight or obese. ? People who are African American.  If you are 66-19 years of age, have your blood pressure checked every 3-5 years. If you are 19 years of age or older, have your blood pressure checked every year. You should have your blood pressure measured twice-once when you are at a hospital or clinic, and once when you are not at a hospital or clinic. Record the average of the two measurements. To check your blood pressure when you are not at a hospital or clinic, you can use: ? An automated blood pressure machine at a pharmacy. ? A home blood pressure monitor.  If you are between 66 years and 61 years old, ask your health care provider if you should take aspirin to prevent strokes.  Have regular diabetes screenings. This involves taking a blood sample to check your fasting blood sugar level. ? If you are at a normal weight and have a low risk for diabetes, have this test once every three years after 72 years of age. ? If you are overweight and have a high risk for diabetes, consider being tested at a younger age or more often. Preventing infection Hepatitis B  If you have a higher risk for hepatitis B, you should be screened for this virus. You are considered at high risk for hepatitis B if: ? You were born in a country where hepatitis B is common. Ask your health care provider  which countries are considered high risk. ? Your parents were born in a high-risk country, and you have not been immunized against hepatitis B (hepatitis B vaccine). ? You have HIV or AIDS. ? You use needles to inject street drugs. ? You live with someone who has hepatitis B. ? You have had sex with someone who has hepatitis B. ? You get hemodialysis  treatment. ? You take certain medicines for conditions, including cancer, organ transplantation, and autoimmune conditions.  Hepatitis C  Blood testing is recommended for: ? Everyone born from [birth year] through 1966. ? Anyone with known risk factors for hepatitis C.  Sexually transmitted infections (STIs)  You should be screened for sexually transmitted infections (STIs) including gonorrhea and chlamydia if: ? You are sexually active and are younger than 72 years of age. ? You are older than 72 years of age and your health care provider tells you that you are at risk for this type of infection. ? Your sexual activity has changed since you were last screened and you are at an increased risk for chlamydia or gonorrhea. Ask your health care provider if you are at risk.  If you do not have HIV, but are at risk, it may be recommended that you take a prescription medicine daily to prevent HIV infection. This is called pre-exposure prophylaxis (PrEP). You are considered at risk if: ? You are sexually active and do not regularly use condoms or know the HIV status of your partner(s). ? You take drugs by injection. ? You are sexually active with a partner who has HIV.  Talk with your health care provider about whether you are at high risk of being infected with HIV. If you choose to begin PrEP, you should first be tested for HIV. You should then be tested every 3 months for as long as you are taking PrEP. Pregnancy  If you are premenopausal and you may become pregnant, ask your health care provider about preconception counseling.  If you may become  pregnant, take 400 to 800 micrograms (mcg) of folic acid every day.  If you want to prevent pregnancy, talk to your health care provider about birth control (contraception). Osteoporosis and menopause  Osteoporosis is a disease in which the bones lose minerals and strength with aging. This can result in serious bone fractures. Your risk for osteoporosis can be identified using a bone density scan.  If you are 84 years of age or older, or if you are at risk for osteoporosis and fractures, ask your health care provider if you should be screened.  Ask your health care provider whether you should take a calcium or vitamin D supplement to lower your risk for osteoporosis.  Menopause may have certain physical symptoms and risks.  Hormone replacement therapy may reduce some of these symptoms and risks. Talk to your health care provider about whether hormone replacement therapy is right for you. Follow these instructions at home:  Schedule regular health, dental, and eye exams.  Stay current with your immunizations.  Do not use any tobacco products including cigarettes, chewing tobacco, or electronic cigarettes.  If you are pregnant, do not drink alcohol.  If you are breastfeeding, limit how much and how often you drink alcohol.  Limit alcohol intake to no more than 1 drink per day for nonpregnant women. One drink equals 12 ounces of beer, 5 ounces of wine, or 1 ounces of hard liquor.  Do not use street drugs.  Do not share needles.  Ask your health care provider for help if you need support or information about quitting drugs.  Tell your health care provider if you often feel depressed.  Tell your health care provider if you have ever been abused or do not feel safe at home. This information is not intended to replace advice given to you by your health care provider. Make sure you discuss any questions  you have with your health care provider. Document Released: 03/07/2011 Document  Revised: 01/28/2016 Document Reviewed: 05/26/2015 Elsevier Interactive Patient Education  [employer].

## 2017-07-11 ENCOUNTER — Other Ambulatory Visit (INDEPENDENT_AMBULATORY_CARE_PROVIDER_SITE_OTHER): Payer: Medicare Other

## 2017-07-11 ENCOUNTER — Encounter: Payer: Self-pay | Admitting: Internal Medicine

## 2017-07-11 ENCOUNTER — Ambulatory Visit (INDEPENDENT_AMBULATORY_CARE_PROVIDER_SITE_OTHER): Payer: Medicare Other | Admitting: Internal Medicine

## 2017-07-11 VITALS — BP 124/82 | HR 81 | Temp 98.3°F | Resp 16 | Ht 64.0 in | Wt 149.0 lb

## 2017-07-11 DIAGNOSIS — E039 Hypothyroidism, unspecified: Secondary | ICD-10-CM | POA: Diagnosis not present

## 2017-07-11 DIAGNOSIS — Z0000 Encounter for general adult medical examination without abnormal findings: Secondary | ICD-10-CM

## 2017-07-11 DIAGNOSIS — G4710 Hypersomnia, unspecified: Secondary | ICD-10-CM

## 2017-07-11 DIAGNOSIS — M85862 Other specified disorders of bone density and structure, left lower leg: Secondary | ICD-10-CM

## 2017-07-11 DIAGNOSIS — E7849 Other hyperlipidemia: Secondary | ICD-10-CM

## 2017-07-11 DIAGNOSIS — M85861 Other specified disorders of bone density and structure, right lower leg: Secondary | ICD-10-CM

## 2017-07-11 DIAGNOSIS — F419 Anxiety disorder, unspecified: Secondary | ICD-10-CM | POA: Insufficient documentation

## 2017-07-11 LAB — CBC WITH DIFFERENTIAL/PLATELET
BASOS ABS: 0 10*3/uL (ref 0.0–0.1)
Basophils Relative: 0.9 % (ref 0.0–3.0)
Eosinophils Absolute: 0.3 10*3/uL (ref 0.0–0.7)
Eosinophils Relative: 5.3 % — ABNORMAL HIGH (ref 0.0–5.0)
HEMATOCRIT: 40.4 % (ref 36.0–46.0)
HEMOGLOBIN: 13.5 g/dL (ref 12.0–15.0)
LYMPHS PCT: 34.5 % (ref 12.0–46.0)
Lymphs Abs: 1.8 10*3/uL (ref 0.7–4.0)
MCHC: 33.3 g/dL (ref 30.0–36.0)
MCV: 92.3 fl (ref 78.0–100.0)
MONOS PCT: 7.7 % (ref 3.0–12.0)
Monocytes Absolute: 0.4 10*3/uL (ref 0.1–1.0)
NEUTROS ABS: 2.6 10*3/uL (ref 1.4–7.7)
Neutrophils Relative %: 51.6 % (ref 43.0–77.0)
PLATELETS: 232 10*3/uL (ref 150.0–400.0)
RBC: 4.38 Mil/uL (ref 3.87–5.11)
RDW: 13.5 % (ref 11.5–15.5)
WBC: 5.1 10*3/uL (ref 4.0–10.5)

## 2017-07-11 LAB — COMPREHENSIVE METABOLIC PANEL
ALT: 12 U/L (ref 0–35)
AST: 14 U/L (ref 0–37)
Albumin: 4.2 g/dL (ref 3.5–5.2)
Alkaline Phosphatase: 63 U/L (ref 39–117)
BILIRUBIN TOTAL: 0.5 mg/dL (ref 0.2–1.2)
BUN: 17 mg/dL (ref 6–23)
CALCIUM: 10.4 mg/dL (ref 8.4–10.5)
CHLORIDE: 104 meq/L (ref 96–112)
CO2: 28 meq/L (ref 19–32)
Creatinine, Ser: 0.63 mg/dL (ref 0.40–1.20)
GFR: 98.58 mL/min (ref >60.00)
Glucose, Bld: 96 mg/dL (ref 70–99)
Potassium: 4.5 mEq/L (ref 3.5–5.1)
Sodium: 139 mEq/L (ref 135–145)
Total Protein: 7.1 g/dL (ref 6.0–8.3)

## 2017-07-11 LAB — LIPID PANEL
CHOL/HDL RATIO: 3
Cholesterol: 162 mg/dL (ref 0–200)
HDL: 51 mg/dL (ref >39.00)
LDL CALC: 87 mg/dL (ref 0–99)
NonHDL: 111.01
TRIGLYCERIDES: 119 mg/dL (ref 0.0–149.0)
VLDL: 23.8 mg/dL (ref 0.0–40.0)

## 2017-07-11 LAB — TSH: TSH: 2.99 u[IU]/mL (ref 0.35–4.50)

## 2017-07-11 MED ORDER — DICLOFENAC SODIUM 75 MG PO TBEC: DELAYED_RELEASE_TABLET | ORAL | 3 refills | Status: DC

## 2017-07-11 MED ORDER — LEVOTHYROXINE SODIUM 25 MCG PO TABS: 25.0000 ug | ORAL_TABLET | Freq: Every day | ORAL | 1 refills | Status: DC

## 2017-07-11 MED ORDER — FLUOXETINE HCL 40 MG PO CAPS: 40.0000 mg | ORAL_CAPSULE | Freq: Every day | ORAL | 1 refills | Status: DC

## 2017-07-11 NOTE — Assessment & Plan Note (Signed)
dexa up to date  Stressed the importance of regular exercise Taking calcium and vitamin D

## 2017-07-11 NOTE — Assessment & Plan Note (Signed)
Check tsh  Titrate med dose if needed  

## 2017-07-11 NOTE — Assessment & Plan Note (Signed)
Having some mild anxiety episodes Will try prozac at 40 mg daily

## 2017-07-11 NOTE — Assessment & Plan Note (Signed)
Check lipid panel  Continue daily statin Regular exercise and healthy diet encouraged  

## 2017-07-11 NOTE — Assessment & Plan Note (Addendum)
Well controlled with prozac 20 mg daily, but has been experiencing some increased anxiety so we will try increasing Prozac, but hypersomnia with the original reason she went on the medication

## 2017-07-15 ENCOUNTER — Encounter: Payer: Self-pay | Admitting: Internal Medicine

## 2017-09-13 ENCOUNTER — Other Ambulatory Visit: Payer: Self-pay | Admitting: Internal Medicine

## 2018-01-05 ENCOUNTER — Other Ambulatory Visit: Payer: Self-pay | Admitting: Internal Medicine

## 2018-01-30 LAB — HM MAMMOGRAPHY

## 2018-02-01 ENCOUNTER — Encounter: Payer: Self-pay | Admitting: Internal Medicine

## 2018-03-10 ENCOUNTER — Other Ambulatory Visit: Payer: Self-pay | Admitting: Internal Medicine

## 2018-06-04 ENCOUNTER — Other Ambulatory Visit: Payer: Self-pay | Admitting: Internal Medicine

## 2018-07-12 ENCOUNTER — Ambulatory Visit (INDEPENDENT_AMBULATORY_CARE_PROVIDER_SITE_OTHER): Payer: Medicare Other | Admitting: *Deleted

## 2018-07-12 VITALS — BP 124/62 | HR 70 | Resp 17 | Ht 64.0 in | Wt 152.0 lb

## 2018-07-12 DIAGNOSIS — Z0000 Encounter for general adult medical examination without abnormal findings: Secondary | ICD-10-CM

## 2018-07-12 DIAGNOSIS — E2839 Other primary ovarian failure: Secondary | ICD-10-CM | POA: Diagnosis not present

## 2018-07-12 NOTE — Patient Instructions (Addendum)
Bring a copy of your living will and/or healthcare power of attorney to your next office visit.  Continue to eat heart healthy diet (full of fruits, vegetables, whole grains, lean protein, water--limit salt, fat, and sugar intake) and increase physical activity as tolerated.  Health Maintenance, Female Adopting a healthy lifestyle and getting preventive care can go a long way to promote health and wellness. Talk with your health care provider about what schedule of regular examinations is right for you. This is a good chance for you to check in with your provider about disease prevention and staying healthy. In between checkups, there are plenty of things you can do on your own. Experts have done a lot of research about which lifestyle changes and preventive measures are most likely to keep you healthy. Ask your health care provider for more information. Weight and diet Eat a healthy diet  Be sure to include plenty of vegetables, fruits, low-fat dairy products, and lean protein.  Do not eat a lot of foods high in solid fats, added sugars, or salt.  Get regular exercise. This is one of the most important things you can do for your health. ? Most adults should exercise for at least 150 minutes each week. The exercise should increase your heart rate and make you sweat (moderate-intensity exercise). ? Most adults should also do strengthening exercises at least twice a week. This is in addition to the moderate-intensity exercise.  Maintain a healthy weight  Body mass index (BMI) is a measurement that can be used to identify possible weight problems. It estimates body fat based on height and weight. Your health care provider can help determine your BMI and help you achieve or maintain a healthy weight.  For females 73 years of age and older: ? A BMI below 18.5 is considered underweight. ? A BMI of 18.5 to 24.9 is normal. ? A BMI of 25 to 29.9 is considered overweight. ? A BMI of 30 and above is  considered obese.  Watch levels of cholesterol and blood lipids  You should start having your blood tested for lipids and cholesterol at 73 years of age, then have this test every 5 years.  You may need to have your cholesterol levels checked more often if: ? Your lipid or cholesterol levels are high. ? You are older than 73 years of age. ? You are at high risk for heart disease.  Cancer screening Lung Cancer  Lung cancer screening is recommended for adults 42-32 years old who are at high risk for lung cancer because of a history of smoking.  A yearly low-dose CT scan of the lungs is recommended for people who: ? Currently smoke. ? Have quit within the past 15 years. ? Have at least a 30-pack-year history of smoking. A pack year is smoking an average of one pack of cigarettes a day for 1 year.  Yearly screening should continue until it has been 15 years since you quit.  Yearly screening should stop if you develop a health problem that would prevent you from having lung cancer treatment.  Breast Cancer  Practice breast self-awareness. This means understanding how your breasts normally appear and feel.  It also means doing regular breast self-exams. Let your health care provider know about any changes, no matter how small.  If you are in your 20s or 30s, you should have a clinical breast exam (CBE) by a health care provider every 1-3 years as part of a regular health exam.  If  you are 40 or older, have a CBE every year. Also consider having a breast X-ray (mammogram) every year.  If you have a family history of breast cancer, talk to your health care provider about genetic screening.  If you are at high risk for breast cancer, talk to your health care provider about having an MRI and a mammogram every year.  Breast cancer gene (BRCA) assessment is recommended for women who have family members with BRCA-related cancers. BRCA-related cancers  include: ? Breast. ? Ovarian. ? Tubal. ? Peritoneal cancers.  Results of the assessment will determine the need for genetic counseling and BRCA1 and BRCA2 testing.  Cervical Cancer Your health care provider may recommend that you be screened regularly for cancer of the pelvic organs (ovaries, uterus, and vagina). This screening involves a pelvic examination, including checking for microscopic changes to the surface of your cervix (Pap test). You may be encouraged to have this screening done every 3 years, beginning at age 34.  For women ages 5-65, health care providers may recommend pelvic exams and Pap testing every 3 years, or they may recommend the Pap and pelvic exam, combined with testing for human papilloma virus (HPV), every 5 years. Some types of HPV increase your risk of cervical cancer. Testing for HPV may also be done on women of any age with unclear Pap test results.  Other health care providers may not recommend any screening for nonpregnant women who are considered low risk for pelvic cancer and who do not have symptoms. Ask your health care provider if a screening pelvic exam is right for you.  If you have had past treatment for cervical cancer or a condition that could lead to cancer, you need Pap tests and screening for cancer for at least 20 years after your treatment. If Pap tests have been discontinued, your risk factors (such as having a new sexual partner) need to be reassessed to determine if screening should resume. Some women have medical problems that increase the chance of getting cervical cancer. In these cases, your health care provider may recommend more frequent screening and Pap tests.  Colorectal Cancer  This type of cancer can be detected and often prevented.  Routine colorectal cancer screening usually begins at 73 years of age and continues through 73 years of age.  Your health care provider may recommend screening at an earlier age if you have risk factors  for colon cancer.  Your health care provider may also recommend using home test kits to check for hidden blood in the stool.  A small camera at the end of a tube can be used to examine your colon directly (sigmoidoscopy or colonoscopy). This is done to check for the earliest forms of colorectal cancer.  Routine screening usually begins at age 70.  Direct examination of the colon should be repeated every 5-10 years through 73 years of age. However, you may need to be screened more often if early forms of precancerous polyps or small growths are found.  Skin Cancer  Check your skin from head to toe regularly.  Tell your health care provider about any new moles or changes in moles, especially if there is a change in a mole's shape or color.  Also tell your health care provider if you have a mole that is larger than the size of a pencil eraser.  Always use sunscreen. Apply sunscreen liberally and repeatedly throughout the day.  Protect yourself by wearing long sleeves, pants, a wide-brimmed hat, and sunglasses  whenever you are outside.  Heart disease, diabetes, and high blood pressure  High blood pressure causes heart disease and increases the risk of stroke. High blood pressure is more likely to develop in: ? People who have blood pressure in the high end of the normal range (130-139/85-89 mm Hg). ? People who are overweight or obese. ? People who are African American.  If you are 64-28 years of age, have your blood pressure checked every 3-5 years. If you are 58 years of age or older, have your blood pressure checked every year. You should have your blood pressure measured twice-once when you are at a hospital or clinic, and once when you are not at a hospital or clinic. Record the average of the two measurements. To check your blood pressure when you are not at a hospital or clinic, you can use: ? An automated blood pressure machine at a pharmacy. ? A home blood pressure monitor.  If  you are between 46 years and 42 years old, ask your health care provider if you should take aspirin to prevent strokes.  Have regular diabetes screenings. This involves taking a blood sample to check your fasting blood sugar level. ? If you are at a normal weight and have a low risk for diabetes, have this test once every three years after 73 years of age. ? If you are overweight and have a high risk for diabetes, consider being tested at a younger age or more often. Preventing infection Hepatitis B  If you have a higher risk for hepatitis B, you should be screened for this virus. You are considered at high risk for hepatitis B if: ? You were born in a country where hepatitis B is common. Ask your health care provider which countries are considered high risk. ? Your parents were born in a high-risk country, and you have not been immunized against hepatitis B (hepatitis B vaccine). ? You have HIV or AIDS. ? You use needles to inject street drugs. ? You live with someone who has hepatitis B. ? You have had sex with someone who has hepatitis B. ? You get hemodialysis treatment. ? You take certain medicines for conditions, including cancer, organ transplantation, and autoimmune conditions.  Hepatitis C  Blood testing is recommended for: ? Everyone born from [birth year] through 1966. ? Anyone with known risk factors for hepatitis C.  Sexually transmitted infections (STIs)  You should be screened for sexually transmitted infections (STIs) including gonorrhea and chlamydia if: ? You are sexually active and are younger than 72 years of age. ? You are older than 73 years of age and your health care provider tells you that you are at risk for this type of infection. ? Your sexual activity has changed since you were last screened and you are at an increased risk for chlamydia or gonorrhea. Ask your health care provider if you are at risk.  If you do not have HIV, but are at risk, it may be recommended  that you take a prescription medicine daily to prevent HIV infection. This is called pre-exposure prophylaxis (PrEP). You are considered at risk if: ? You are sexually active and do not regularly use condoms or know the HIV status of your partner(s). ? You take drugs by injection. ? You are sexually active with a partner who has HIV.  Talk with your health care provider about whether you are at high risk of being infected with HIV. If you choose to begin PrEP, you should  first be tested for HIV. You should then be tested every 3 months for as long as you are taking PrEP. Pregnancy  If you are premenopausal and you may become pregnant, ask your health care provider about preconception counseling.  If you may become pregnant, take 400 to 800 micrograms (mcg) of folic acid every day.  If you want to prevent pregnancy, talk to your health care provider about birth control (contraception). Osteoporosis and menopause  Osteoporosis is a disease in which the bones lose minerals and strength with aging. This can result in serious bone fractures. Your risk for osteoporosis can be identified using a bone density scan.  If you are 38 years of age or older, or if you are at risk for osteoporosis and fractures, ask your health care provider if you should be screened.  Ask your health care provider whether you should take a calcium or vitamin D supplement to lower your risk for osteoporosis.  Menopause may have certain physical symptoms and risks.  Hormone replacement therapy may reduce some of these symptoms and risks. Talk to your health care provider about whether hormone replacement therapy is right for you. Follow these instructions at home:  Schedule regular health, dental, and eye exams.  Stay current with your immunizations.  Do not use any tobacco products including cigarettes, chewing tobacco, or electronic cigarettes.  If you are pregnant, do not drink alcohol.  If you are  breastfeeding, limit how much and how often you drink alcohol.  Limit alcohol intake to no more than 1 drink per day for nonpregnant women. One drink equals 12 ounces of beer, 5 ounces of wine, or 1 ounces of hard liquor.  Do not use street drugs.  Do not share needles.  Ask your health care provider for help if you need support or information about quitting drugs.  Tell your health care provider if you often feel depressed.  Tell your health care provider if you have ever been abused or do not feel safe at home. This information is not intended to replace advice given to you by your health care provider. Make sure you discuss any questions you have with your health care provider. Document Released: 03/07/2011 Document Revised: 01/28/2016 Document Reviewed: 05/26/2015 Elsevier Interactive Patient Education  [employer].

## 2018-07-12 NOTE — Progress Notes (Signed)
Subjective:   Audrey [name] is a 72 y.o. female who presents for Medicare Annual (Subsequent) preventive examination.  Review of Systems:  No ROS.  Medicare Wellness Visit. Additional risk factors are reflected in the social history.  Cardiac Risk Factors include: dyslipidemia Sleep patterns: feels rested on waking, gets up 1 times nightly to void and sleeps 7-8 hours nightly.    Home Safety/Smoke Alarms: Feels safe in home. Smoke alarms in place.  Living environment; residence and Firearm Safety: [place], can live on one level, no firearms. Lives with husband, no needs for DME, good support system Seat Belt Safety/Bike Helmet: Wears seat belt.     Objective:     Vitals: BP 124/62   Pulse 70   Resp 17   Ht 5\' 4"  (1.626 m)   Wt 152 lb (68.9 kg)   SpO2 98%   BMI 26.09 kg/m   Body mass index is 26.09 kg/m.  Advanced Directives 07/12/2018  Does Patient Have a Medical Advance Directive? Yes  Type of Paramedic of [place];Living will  Copy of [place] in Chart? No - copy requested    Tobacco Social History   Tobacco Use  Smoking Status Never Smoker  Smokeless Tobacco Never Used     Counseling given: Not Answered  Past Medical History:  Diagnosis Date  . Allergy    seasonal allegies; seem worse this year  . Arthritis   . Cancer ([place])    basal cell cancer on face removed; Dr Ronnald Ramp  . Hyperlipidemia    Past Surgical History:  Procedure Laterality Date  . COLONOSCOPY  2012   neg; [facility]  . HEMORRHOID SURGERY     Family History  Problem Relation Age of Onset  . Stroke Maternal Grandmother   . Heart attack Mother        in 37s  . Uterine cancer Mother   . Heart attack Father        in 32s  . Heart attack Maternal Grandfather        in 60s  . Coronary artery disease Brother        stent in 88s  . Other Brother        ? leukemia  . Diabetes Neg Hx    Social History   Socioeconomic History  .  Marital status: Married    Spouse name: Not on file  . Number of children: 2  . Years of education: Not on file  . Highest education level: Not on file  Occupational History  . Not on file  Social Needs  . Financial resource strain: Not hard at all  . Food insecurity:    Worry: Never true    Inability: Never true  . Transportation needs:    Medical: No    Non-medical: No  Tobacco Use  . Smoking status: Never Smoker  . Smokeless tobacco: Never Used  Substance and Sexual Activity  . Alcohol use: No    Alcohol/week: 0.0 standard drinks  . Drug use: No  . Sexual activity: Yes  Lifestyle  . Physical activity:    Days per week: 0 days    Minutes per session: 0 min  . Stress: Not at all  Relationships  . Social connections:    Talks on phone: More than three times a week    Gets together: More than three times a week    Attends religious service: More than 4 times per year  Active member of club or organization: Yes    Attends meetings of clubs or organizations: More than 4 times per year    Relationship status: Married  Other Topics Concern  . Not on file  Social History Narrative   Lives with spouse;   One son in [place]   Dtr in [place]   2 grand children   2 twin girl; in [place] in July (11)    Outpatient Encounter Medications as of 07/12/2018  Medication Sig  . atorvastatin (LIPITOR) 20 MG tablet Take 1 tablet (20 mg total) by mouth daily. --- Office visit needed for further refills  . Calcium Carbonate-Vitamin D (CALCIUM + D PO) Take 1,200 mg by mouth daily.  . cholecalciferol (VITAMIN D) 1000 UNITS tablet Take 1,000 Units by mouth every other day.    . diclofenac (VOLTAREN) 75 MG EC tablet Take by mouth 2 times daily AS NEEDED. Should be taken as infrequently as possible.  Marland Kitchen FLUoxetine (PROZAC) 40 MG capsule TAKE 1 CAPSULE BY MOUTH ONCE DAILY  . levothyroxine (SYNTHROID, LEVOTHROID) 25 MCG tablet TAKE 1 TABLET (25 MCG TOTAL) BY MOUTH DAILY BEFORE BREAKFAST  .  nystatin-triamcinolone (MYCOLOG II) cream Apply 1 application topically as needed.  . triamcinolone (NASACORT) 55 MCG/ACT AERO nasal inhaler Place 1 spray into the nose daily.   No facility-administered encounter medications on file as of 07/12/2018.     Activities of Daily Living In your present state of health, do you have any difficulty performing the following activities: 07/12/2018  Hearing? N  Vision? N  Difficulty concentrating or making decisions? N  Walking or climbing stairs? N  Dressing or bathing? N  Doing errands, shopping? N  Preparing Food and eating ? N  Using the Toilet? N  In the past six months, have you accidently leaked urine? N  Do you have problems with loss of bowel control? N  Managing your Medications? N  Managing your Finances? N  Housekeeping or managing your Housekeeping? N  Some recent data might be hidden    Patient Care Team: Binnie Rail, MD as PCP - General (Internal Medicine)    Assessment:   This is a routine wellness examination for [name]. Physical assessment deferred to PCP.   Exercise Activities and Dietary recommendations Current Exercise Habits: The patient does not participate in regular exercise at present, Exercise limited by: orthopedic condition(s)  Diet (meal preparation, eat out, water intake, caffeinated beverages, dairy products, fruits and vegetables): in general, a "healthy" diet  , well balanced   Reviewed heart healthy  Diet. Encouraged patient to increase daily water and healthy fluid intake.  Goals      Patient Stated   . motivation (pt-stated)     Has issues getting motivated which started x 72 yo Will explore issues related issues; sleep disturbance; taking care of mother etc;      Other   . Patient Stated    . Record weight daily     Exercise;  (recommended 30 minutes; 5 days a week) BMI reviewed and educated Educated regarding online nutrition programs as Gum[employer]nl and http://vang.com/;  fit32me;            Fall Risk Fall Risk  07/12/2018 07/11/2017 07/06/2016 02/06/2015 12/03/2014  Falls in the past year? 0 Yes No No No  Comment - - - fell and broke kneecap a year ago feb -  Number falls in past yr: - 1 - - -  Injury with Fall? - Yes - - -  Risk for fall due to : Impaired balance/gait - - - -  Risk for fall due to: Comment balance exercise print-out provided - - - -    Depression Screen PHQ 2/9 Scores 07/12/2018 07/11/2017 07/06/2016 02/06/2015  PHQ - 2 Score 2 0 0 0  PHQ- 9 Score 3 - - -     Cognitive Function MMSE - Mini Mental State Exam 07/12/2018 02/06/2015  Not completed: Refused Unable to complete        Immunization History  Administered Date(s) Administered  . Influenza Whole 05/26/2010  . Influenza, High Dose Seasonal PF 06/05/2018  . Influenza-Unspecified 09/19/2014, 07/13/2016, 06/19/2017  . Pneumococcal Conjugate-13 02/06/2015  . Pneumococcal Polysaccharide-23 05/09/2012  . Tdap 02/06/2015   Screening Tests Health Maintenance  Topic Date Due  . DEXA SCAN  12/28/2017  . MAMMOGRAM  01/31/2020  . COLONOSCOPY  04/06/2021  . TETANUS/TDAP  02/05/2025  . INFLUENZA VACCINE  Completed  . Hepatitis C Screening  Completed  . PNA vac Low Risk Adult  Completed      Plan:     Bring a copy of your living will and/or healthcare power of attorney to your next office visit.  Continue to eat heart healthy diet (full of fruits, vegetables, whole grains, lean protein, water--limit salt, fat, and sugar intake) and increase physical activity as tolerated.  I have personally reviewed and noted the following in the patient's chart:   . Medical and social history . Use of alcohol, tobacco or illicit drugs  . Current medications and supplements . Functional ability and status . Nutritional status . Physical activity . Advanced directives . List of other physicians . Vitals . Screenings to include cognitive, depression, and falls . Referrals and  appointments  In addition, I have reviewed and discussed with patient certain preventive protocols, quality metrics, and best practice recommendations. A written personalized care plan for preventive services as well as general preventive health recommendations were provided to patient.     Michiel Cowboy, RN  07/12/2018

## 2018-07-12 NOTE — Progress Notes (Signed)
Medical screening examination/treatment/procedure(s) were performed by non-physician practitioner and as supervising physician I was immediately available for consultation/collaboration. I agree with above. Ryane A Lauro Manlove, MD 

## 2018-07-17 ENCOUNTER — Other Ambulatory Visit: Payer: Self-pay | Admitting: Internal Medicine

## 2018-08-06 ENCOUNTER — Ambulatory Visit: Payer: Medicare Other | Admitting: Internal Medicine

## 2018-08-06 ENCOUNTER — Encounter: Payer: Self-pay | Admitting: Internal Medicine

## 2018-08-06 ENCOUNTER — Ambulatory Visit (INDEPENDENT_AMBULATORY_CARE_PROVIDER_SITE_OTHER)
Admission: RE | Admit: 2018-08-06 | Discharge: 2018-08-06 | Disposition: A | Discharge status: Home / Self Care | Payer: Medicare Other | Source: Ambulatory Visit | Attending: Internal Medicine | Admitting: Internal Medicine

## 2018-08-06 VITALS — BP 122/78 | HR 61 | Temp 97.9°F | Resp 16 | Ht 64.0 in | Wt 152.4 lb

## 2018-08-06 DIAGNOSIS — M545 Low back pain, unspecified: Secondary | ICD-10-CM | POA: Insufficient documentation

## 2018-08-06 MED ORDER — PREDNISONE 20 MG PO TABS: 40.0000 mg | ORAL_TABLET | Freq: Every day | ORAL | 0 refills | Status: DC

## 2018-08-06 NOTE — Progress Notes (Signed)
Subjective:    Patient ID: Audrey [name], female    DOB: [birth year], 72 y.o.   MRN: [id number]  HPI The patient is here for an acute visit.  Chronic lower back pain:  She has chronic lower back pain and it is worse with certain activities.  She usually would not have daily pain.  Doing preparation for thanksgiving made her pain flare up and now it will not go away.  She has intermittent dull pain in her lower back and occasional electrical type pain that is severe.  Her pain is located in the left lower back.  She denies any radiation of the pain into the leg.  She denies muscle spasms, numbness or tingling.  After she sits or lays down she feels normal, but once she gets up and starts walking on her left leg it starts.  She can walk bent over and it relieves the problem in the knee and back or extending her back increases the pain.   She denies any changes in her bowels or bladder.  She has tried aleve and it did not help.    Medications and allergies reviewed with patient and updated if appropriate.  Patient Active Problem List   Diagnosis Date Noted  . Anxiety 07/11/2017  . Hypothyroidism 02/07/2015  . Arthralgia of multiple joints 05/28/2014  . Vitamin D deficiency 05/09/2012  . Osteopenia 05/26/2010  . Hypersomnia 02/22/2010  . Hyperlipidemia 01/21/2010  . Diverticulosis of large intestine 01/21/2010  . 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 SKIN CANCER, HX OF 01/21/2010    Current Outpatient Medications on File Prior to Visit  Medication Sig Dispense Refill  . atorvastatin (LIPITOR) 20 MG tablet Take 1 tablet (20 mg total) by mouth daily. --- Office visit needed for further refills 90 tablet 0  . Calcium Carbonate-Vitamin D (CALCIUM + D PO) Take 1,200 mg by mouth daily.    . cholecalciferol (VITAMIN D) 1000 UNITS tablet Take 1,000 Units by mouth every other day.      . diclofenac (VOLTAREN) 75 MG EC tablet Take by mouth 2 times daily AS NEEDED. Should be  taken as infrequently as possible. 60 tablet 3  . FLUoxetine (PROZAC) 40 MG capsule TAKE 1 CAPSULE BY MOUTH EVERY DAY 90 capsule 1  . levothyroxine (SYNTHROID, LEVOTHROID) 25 MCG tablet Take 1 tablet by mouth (25 MCG) daily before breakfast. Need office visit for more refills. 30 tablet 0  . nystatin-triamcinolone (MYCOLOG II) cream Apply 1 application topically as needed.    . triamcinolone (NASACORT) 55 MCG/ACT AERO nasal inhaler Place 1 spray into the nose daily.     No current facility-administered medications on file prior to visit.     Past Medical History:  Diagnosis Date  . Allergy    seasonal allegies; seem worse this year  . Arthritis   . 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 Hyperlipidemia     Past Surgical History:  Procedure Laterality Date  . COLONOSCOPY  2012   neg; [facility]  . HEMORRHOID SURGERY      Social History   Socioeconomic History  . Marital status: Married    Spouse name: Not on file  . Number of children: 2  . Years of education: Not on file  . Highest education level: Not on file  Occupational History  . Not on file  Social Needs  . Financial resource strain: Not hard at all  . Food  insecurity:    Worry: Never true    Inability: Never true  . Transportation needs:    Medical: No    Non-medical: No  Tobacco Use  . Smoking status: Never Smoker  . Smokeless tobacco: Never Used  Substance and Sexual Activity  . Alcohol use: No    Alcohol/week: 0.0 standard drinks  . Drug use: No  . Sexual activity: Yes  Lifestyle  . Physical activity:    Days per week: 0 days    Minutes per session: 0 min  . Stress: Not at all  Relationships  . Social connections:    Talks on phone: More than three times a week    Gets together: More than three times a week    Attends religious service: More than 4 times per year    Active member of club or organization: Yes    Attends meetings of clubs or organizations: More than 4 times  per year    Relationship status: Married  Other Topics Concern  . Not on file  Social History Narrative   Lives with spouse;   One son in [place]   Dtr in [place]   2 grand children   2 twin girl; in [place] in July (11)    Family History  Problem Relation Age of Onset  . Stroke Maternal Grandmother   . Heart attack Mother        in 9s  . Uterine cancer Mother   . Heart attack Father        in 81s  . Heart attack Maternal Grandfather        in 61s  . Coronary artery disease Brother        stent in 36s  . Other Brother        ? leukemia  . Diabetes Neg Hx     Review of Systems Per HPI    Objective:   Vitals:   08/06/18 1110  BP: 122/78  Pulse: 61  Resp: 16  Temp: 97.9 F (36.6 C)  SpO2: 97%   BP Readings from Last 3 Encounters:  08/06/18 122/78  07/12/18 124/62  07/11/17 124/82   Wt Readings from Last 3 Encounters:  08/06/18 152 lb 6.4 oz (69.1 kg)  07/12/18 152 lb (68.9 kg)  07/11/17 149 lb (67.6 kg)   Body mass index is 26.16 kg/m.   Physical Exam  Constitutional: She appears well-developed and well-nourished.  HENT:  Head: Normocephalic and atraumatic.  Musculoskeletal: She exhibits no edema, tenderness (No tenderness with palpation lumbar spine or left SI joint.  Neuro generalized tenderness in left lower back with palpation) or deformity (in lumbar spine).  Neurological:  Normal sensation and strength bilateral lower extremities, gait slightly slower than normal.  Skin: Skin is warm and dry. She is not diaphoretic.        Lumbar x-ray 2012-mild spondylosis without acute finding Lumbar x-ray 2016-mild degenerative disc disease and facet disease.  Grade 1 anterior listhesis of L4 on L5   Assessment & Plan:    See Problem List for Assessment and Plan of chronic medical problems.

## 2018-08-06 NOTE — Patient Instructions (Addendum)
Have an x-ray today.  Take the steroids as prescribed - take early in the day and with food.  If you have stomach upset let me know.  After you finish the prednisone you can take advil/ aleve or diclofenac.    Avoid activities that cause pain.    Think about seeing an orthopedic at some point.

## 2018-08-06 NOTE — Assessment & Plan Note (Addendum)
Previous x-rays have shown some mild spondylosis, mild degenerative disc disease and facet disease and grade 1 anterior listhesis Back pain flared with being on her feet long hours preparing for Thanksgiving Aleve and Advil have not been effective We will try prednisone 40 mg daily x5 days-advised to take with food and ideally first in the morning.  Discussed possible side effects Can restart Aleve or Advil when she completes the prednisone Discussed physical therapy and referral to orthopedics-she will let me know if there is no improvement in her symptoms

## 2018-08-07 ENCOUNTER — Encounter: Payer: Self-pay | Admitting: Internal Medicine

## 2018-08-13 ENCOUNTER — Other Ambulatory Visit: Payer: Self-pay | Admitting: Internal Medicine

## 2018-09-05 ENCOUNTER — Other Ambulatory Visit: Payer: Self-pay | Admitting: Internal Medicine

## 2018-09-10 ENCOUNTER — Other Ambulatory Visit: Payer: Self-pay | Admitting: Internal Medicine

## 2018-10-03 ENCOUNTER — Other Ambulatory Visit: Payer: Self-pay | Admitting: Internal Medicine

## 2018-10-03 NOTE — Progress Notes (Signed)
Subjective:    Patient ID: Audrey [name], female    DOB: [birth year], 73 y.o.   MRN: [id number]  HPI The patient is here for follow up.  She denies any changes in her health since she was here last.  She has no concerns.  Hypothyroidism:  She is taking her medication daily.  She denies any recent changes in energy or weight that are unexplained.   Hyperlipidemia: She is taking her medication daily. She is compliant with a low fat/cholesterol diet. She is not exercising regularly. She denies myalgias.   Anxiety: She is taking her medication daily as prescribed. She denies any side effects from the medication. She feels her anxiety is well controlled and she is happy with her current dose of medication.   Chronic lower back pain:  She has spondylosis and had increased pain in her back last month and I prescribed a course of prednisone.  She takes diclofenac as needed, but does not take it often.    Osteopenia:  She is taking calcium and vitamin d daily.  She is not exercising.  Her last dexa was 2017 - one was ordered for [facility] in 11.2019.    Medications and allergies reviewed with patient and updated if appropriate.  Patient Active Problem List   Diagnosis Date Noted  . Acute left-sided low back pain without sciatica 08/06/2018  . Anxiety 07/11/2017  . Hypothyroidism 02/07/2015  . Arthralgia of multiple joints 05/28/2014  . Vitamin D deficiency 05/09/2012  . Osteopenia 05/26/2010  . Hypersomnia 02/22/2010  . Hyperlipidemia 01/21/2010  . Diverticulosis of large intestine 01/21/2010  . 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 SKIN CANCER, HX OF 01/21/2010    Current Outpatient Medications on File Prior to Visit  Medication Sig Dispense Refill  . atorvastatin (LIPITOR) 20 MG tablet Take 1 tablet by mouth daily. Needs office visit for more refills. 30 tablet 0  . Calcium Carbonate-Vitamin D (CALCIUM + D PO) Take 1,200 mg by mouth daily.    . cholecalciferol  (VITAMIN D) 1000 UNITS tablet Take 1,000 Units by mouth every other day.      . diclofenac (VOLTAREN) 75 MG EC tablet Take by mouth 2 times daily AS NEEDED. Should be taken as infrequently as possible. 60 tablet 3  . FLUoxetine (PROZAC) 40 MG capsule TAKE 1 CAPSULE BY MOUTH EVERY DAY 90 capsule 1  . levothyroxine (SYNTHROID, LEVOTHROID) 25 MCG tablet TAKE 1 TABLET BY MOUTH BEFORE BREAKFAST (OFFICE VISIT FOR FUTURE REFILLS) 30 tablet 0  . nystatin-triamcinolone (MYCOLOG II) cream Apply 1 application topically as needed.    . triamcinolone (NASACORT) 55 MCG/ACT AERO nasal inhaler Place 1 spray into the nose daily.     No current facility-administered medications on file prior to visit.     Past Medical History:  Diagnosis Date  . Allergy    seasonal allegies; seem worse this year  . Arthritis   . 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 Hyperlipidemia     Past Surgical History:  Procedure Laterality Date  . COLONOSCOPY  2012   neg; [facility]  . HEMORRHOID SURGERY      Social History   Socioeconomic History  . Marital status: Married    Spouse name: Not on file  . Number of children: 2  . Years of education: Not on file  . Highest education level: Not on file  Occupational History  . Not on file  Social  Needs  . Financial resource strain: Not hard at all  . Food insecurity:    Worry: Never true    Inability: Never true  . Transportation needs:    Medical: No    Non-medical: No  Tobacco Use  . Smoking status: Never Smoker  . Smokeless tobacco: Never Used  Substance and Sexual Activity  . Alcohol use: No    Alcohol/week: 0.0 standard drinks  . Drug use: No  . Sexual activity: Yes  Lifestyle  . Physical activity:    Days per week: 0 days    Minutes per session: 0 min  . Stress: Not at all  Relationships  . Social connections:    Talks on phone: More than three times a week    Gets together: More than three times a week    Attends  religious service: More than 4 times per year    Active member of club or organization: Yes    Attends meetings of clubs or organizations: More than 4 times per year    Relationship status: Married  Other Topics Concern  . Not on file  Social History Narrative   Lives with spouse;   One son in [place]   Dtr in [place]   2 grand children   2 twin girl; in [place] in July (11)    Family History  Problem Relation Age of Onset  . Stroke Maternal Grandmother   . Heart attack Mother        in 67s  . Uterine cancer Mother   . Heart attack Father        in 61s  . Heart attack Maternal Grandfather        in 18s  . Coronary artery disease Brother        stent in 22s  . Other Brother        ? leukemia  . Diabetes Neg Hx     Review of Systems  Constitutional: Negative for chills, fever and unexpected weight change.  Respiratory: Negative for cough, shortness of breath and wheezing.   Cardiovascular: Negative for chest pain, palpitations and leg swelling.  Gastrointestinal: Negative for abdominal pain and nausea.       No gerd  Musculoskeletal: Positive for arthralgias (diffuse arthritis).  Neurological: Negative for dizziness, light-headedness and headaches.       Objective:   Vitals:   10/04/18 0756  BP: 120/80  Pulse: 82  Resp: 16  Temp: 98.2 F (36.8 C)  SpO2: 99%   BP Readings from Last 3 Encounters:  10/04/18 120/80  08/06/18 122/78  07/12/18 124/62   Wt Readings from Last 3 Encounters:  10/04/18 151 lb (68.5 kg)  08/06/18 152 lb 6.4 oz (69.1 kg)  07/12/18 152 lb (68.9 kg)   Body mass index is 25.92 kg/m.   Physical Exam    Constitutional: Appears well-developed and well-nourished. No distress.  HENT:  Head: Normocephalic and atraumatic.  Neck: Neck supple. No tracheal deviation present. No thyromegaly present.  No cervical lymphadenopathy Cardiovascular: Normal rate, regular rhythm and normal heart sounds.   No murmur heard. No carotid bruit .  No  edema Pulmonary/Chest: Effort normal and breath sounds normal. No respiratory distress. No has no wheezes. No rales.  Skin: Skin is warm and dry. Not diaphoretic.  Psychiatric: Normal mood and affect. Behavior is normal.      Assessment & Plan:    See Problem List for Assessment and Plan of chronic medical problems.

## 2018-10-03 NOTE — Patient Instructions (Addendum)
  Tests ordered today. Your results will be released to [place] (or called to you) after review, usually within 72hours after test completion. If any changes need to be made, you will be notified at that same time.  Schedule your bone density at [facility]  Medications reviewed and updated.  Changes include :   none  Your prescription(s) have been submitted to your pharmacy. Please take as directed and contact our office if you believe you are having problem(s) with the medication(s).   Please followup in one year for a physical

## 2018-10-04 ENCOUNTER — Encounter: Payer: Self-pay | Admitting: Internal Medicine

## 2018-10-04 ENCOUNTER — Other Ambulatory Visit (INDEPENDENT_AMBULATORY_CARE_PROVIDER_SITE_OTHER): Payer: Medicare Other

## 2018-10-04 ENCOUNTER — Ambulatory Visit: Payer: Medicare Other | Admitting: Internal Medicine

## 2018-10-04 VITALS — BP 120/80 | HR 82 | Temp 98.2°F | Resp 16 | Ht 64.0 in | Wt 151.0 lb

## 2018-10-04 DIAGNOSIS — F419 Anxiety disorder, unspecified: Secondary | ICD-10-CM | POA: Diagnosis not present

## 2018-10-04 DIAGNOSIS — E039 Hypothyroidism, unspecified: Secondary | ICD-10-CM

## 2018-10-04 DIAGNOSIS — M2550 Pain in unspecified joint: Secondary | ICD-10-CM

## 2018-10-04 DIAGNOSIS — E7849 Other hyperlipidemia: Secondary | ICD-10-CM

## 2018-10-04 DIAGNOSIS — M85862 Other specified disorders of bone density and structure, left lower leg: Secondary | ICD-10-CM

## 2018-10-04 DIAGNOSIS — M85861 Other specified disorders of bone density and structure, right lower leg: Secondary | ICD-10-CM

## 2018-10-04 DIAGNOSIS — M4716 Other spondylosis with myelopathy, lumbar region: Secondary | ICD-10-CM | POA: Diagnosis not present

## 2018-10-04 LAB — COMPREHENSIVE METABOLIC PANEL
ALT: 15 U/L (ref 0–35)
AST: 15 U/L (ref 0–37)
Albumin: 4.3 g/dL (ref 3.5–5.2)
Alkaline Phosphatase: 65 U/L (ref 39–117)
BUN: 16 mg/dL (ref 6–23)
CO2: 28 mEq/L (ref 19–32)
Calcium: 10.5 mg/dL (ref 8.4–10.5)
Chloride: 105 mEq/L (ref 96–112)
Creatinine, Ser: 0.68 mg/dL (ref 0.40–1.20)
GFR: 84.64 mL/min (ref >60.00)
Glucose, Bld: 88 mg/dL (ref 70–99)
Potassium: 4.4 mEq/L (ref 3.5–5.1)
Sodium: 141 mEq/L (ref 135–145)
Total Bilirubin: 0.5 mg/dL (ref 0.2–1.2)
Total Protein: 6.6 g/dL (ref 6.0–8.3)

## 2018-10-04 LAB — CBC WITH DIFFERENTIAL/PLATELET
Basophils Absolute: 0.1 10*3/uL (ref 0.0–0.1)
Basophils Relative: 1.4 % (ref 0.0–3.0)
Eosinophils Absolute: 0.3 10*3/uL (ref 0.0–0.7)
Eosinophils Relative: 6.2 % — ABNORMAL HIGH (ref 0.0–5.0)
HCT: 41.6 % (ref 36.0–46.0)
Hemoglobin: 13.8 g/dL (ref 12.0–15.0)
Lymphocytes Relative: 24.7 % (ref 12.0–46.0)
Lymphs Abs: 1.1 10*3/uL (ref 0.7–4.0)
MCHC: 33.2 g/dL (ref 30.0–36.0)
MCV: 93.1 fl (ref 78.0–100.0)
Monocytes Absolute: 0.4 10*3/uL (ref 0.1–1.0)
Monocytes Relative: 8.4 % (ref 3.0–12.0)
NEUTROS PCT: 59.3 % (ref 43.0–77.0)
Neutro Abs: 2.8 10*3/uL (ref 1.4–7.7)
Platelets: 237 10*3/uL (ref 150.0–400.0)
RBC: 4.47 Mil/uL (ref 3.87–5.11)
RDW: 13.8 % (ref 11.5–15.5)
WBC: 4.6 10*3/uL (ref 4.0–10.5)

## 2018-10-04 LAB — LIPID PANEL
Cholesterol: 176 mg/dL (ref 0–200)
HDL: 53.1 mg/dL (ref >39.00)
LDL Cholesterol: 97 mg/dL (ref 0–99)
NonHDL: 122.82
Total CHOL/HDL Ratio: 3
Triglycerides: 129 mg/dL (ref 0.0–149.0)
VLDL: 25.8 mg/dL (ref 0.0–40.0)

## 2018-10-04 LAB — TSH: TSH: 3.5 u[IU]/mL (ref 0.35–4.50)

## 2018-10-04 MED ORDER — FLUOXETINE HCL 40 MG PO CAPS: ORAL_CAPSULE | ORAL | 3 refills | Status: DC

## 2018-10-04 MED ORDER — ATORVASTATIN CALCIUM 20 MG PO TABS: ORAL_TABLET | ORAL | 3 refills | Status: DC

## 2018-10-04 MED ORDER — LEVOTHYROXINE SODIUM 25 MCG PO TABS: ORAL_TABLET | ORAL | 3 refills | Status: AC

## 2018-10-04 NOTE — Assessment & Plan Note (Signed)
Chronic, intermittent pain Takes Voltaren as needed Advised taking Tylenol as needed to see if that helps Encouraged regular exercise

## 2018-10-04 NOTE — Assessment & Plan Note (Signed)
Clinically euthyroid Check tsh  Titrate med dose if needed  

## 2018-10-04 NOTE — Assessment & Plan Note (Signed)
Controlled, stable Continue current dose of medication  

## 2018-10-04 NOTE — Assessment & Plan Note (Signed)
Advised taking Tylenol Diclofenac only as needed, but not regularly Discussed natural supplements and topical treatments that may help Encouraged regular exercise

## 2018-10-04 NOTE — Assessment & Plan Note (Signed)
Check lipid panel  Continue daily statin Regular exercise and healthy diet encouraged  

## 2018-10-04 NOTE — Assessment & Plan Note (Signed)
Taking calcium and vitamin D Not currently exercising-encouraged regular exercise DEXA ordered-advised to reschedule at [facility] when she has her mammogram done this year

## 2019-02-05 LAB — HM MAMMOGRAPHY

## 2019-03-06 ENCOUNTER — Encounter: Payer: Self-pay | Admitting: Internal Medicine

## 2019-06-04 IMAGING — DX DG LUMBAR SPINE COMPLETE 4+V
5 series · 5 of 5 positions shown · non-contrast
Comparison: 02/06/2015.

CLINICAL DATA: 72-year-old female with worsening lower back pain
over the past 7 days. Chronic back pain. No known injury. Subsequent
encounter.

EXAM:
LUMBAR SPINE - COMPLETE 4+ VIEW

[l-spine ap]
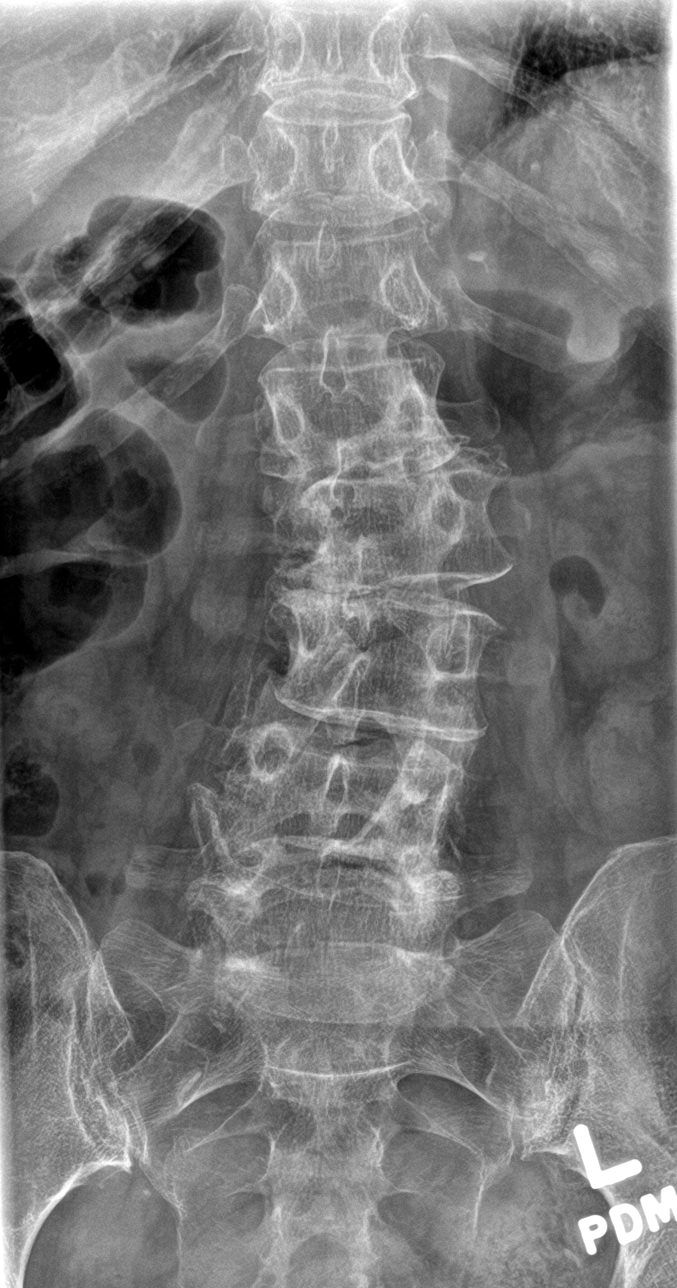

[l-spine obl (1 of 2)]
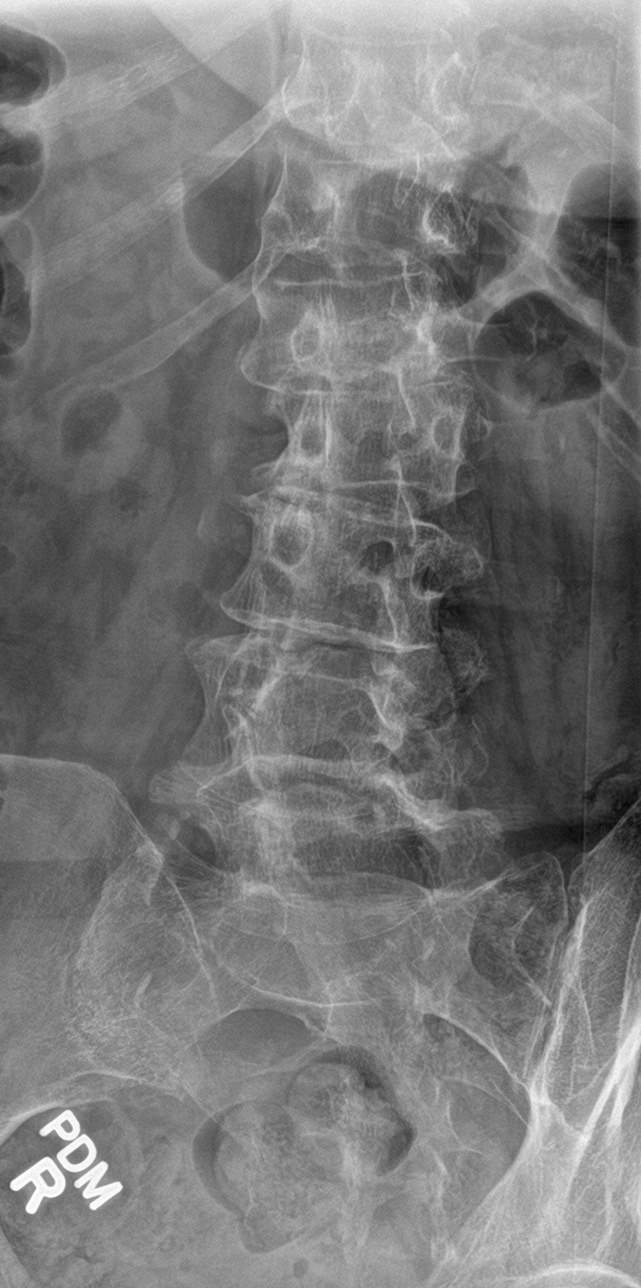

[l-spine obl (2 of 2)]
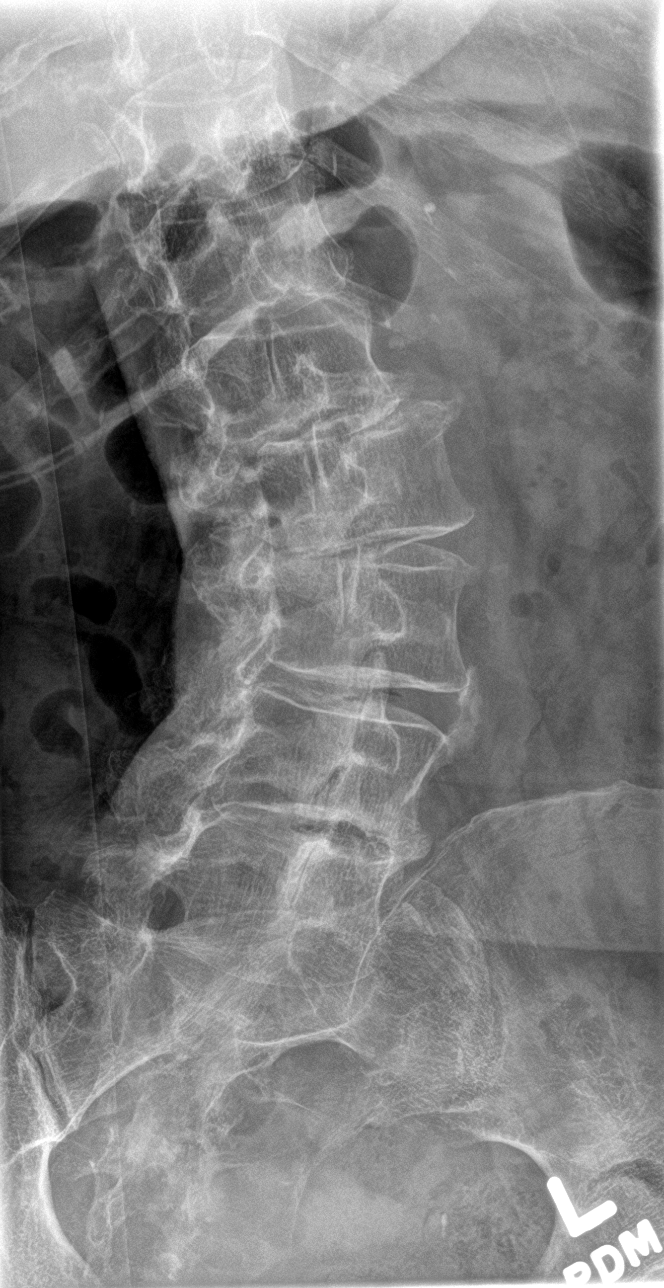

[l-spine lat]
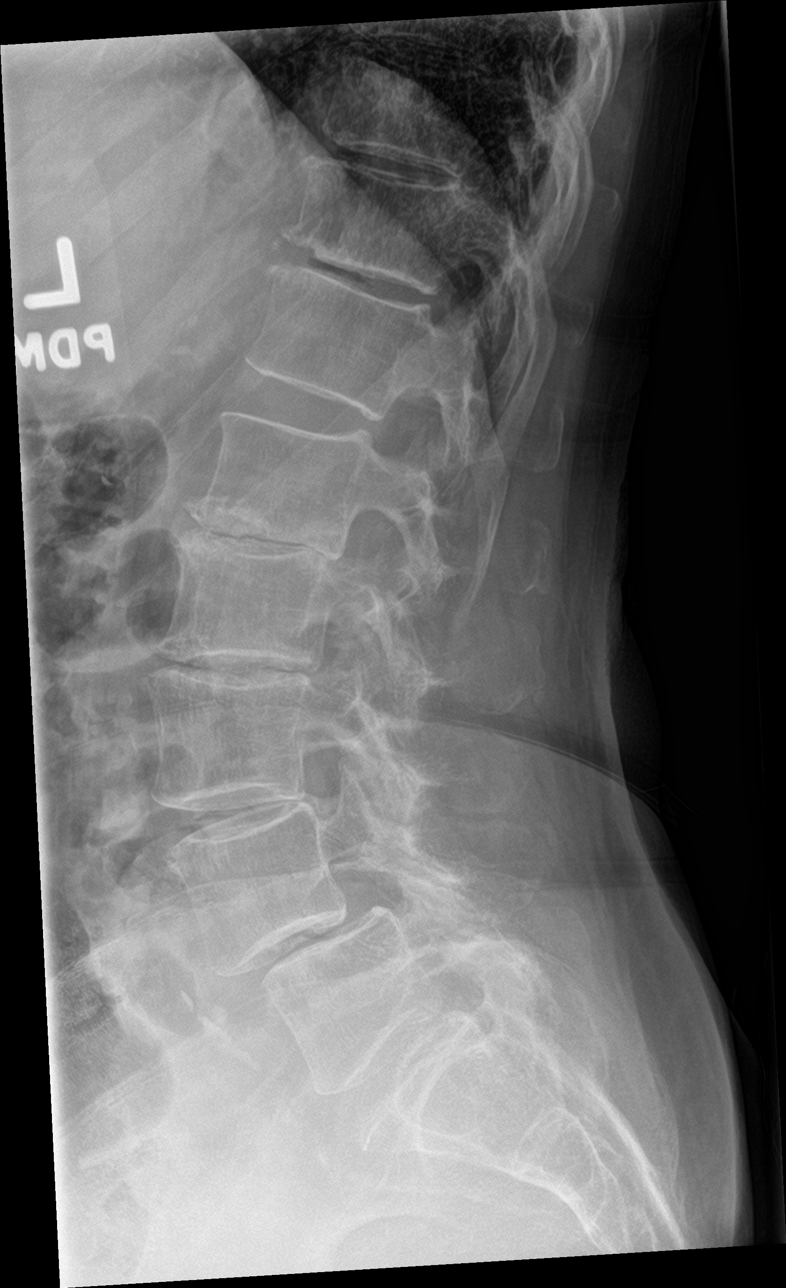

[l-spine spot]
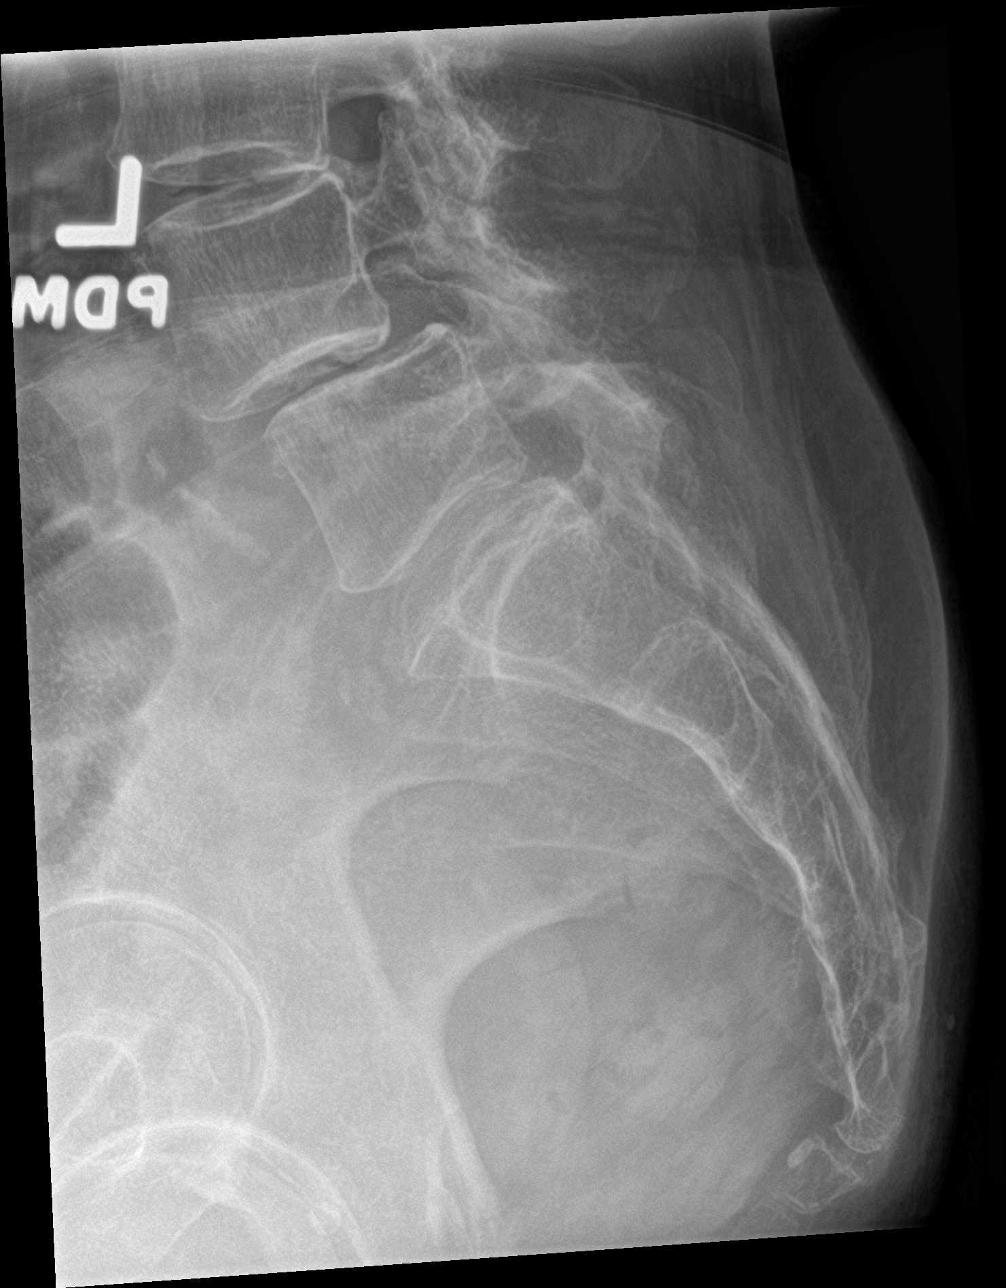

[5 of 5 positions shown; findings below may reference images not displayed]

FINDINGS: Progressive scoliosis lumbar spine convex left.

Progressive right-sided L2-3 disc space narrowing now marked in
degree with right lateral osteophyte.

Progressive L1-2 disc space narrowing now marked in degree.

10 mm anterior slip L4 versus prior 8 mm. This appears to be related
to facet degenerative changes as no clear pars defect noted.
Moderate L4-5 disc space narrowing.

L5-S1 facet degenerative changes. Mild to moderate L5-S1 disc space
narrowing.

L3-4 mild to moderate disc space narrowing greatest posteriorly.
IMPRESSION: Progressive scoliosis and superimposed degenerative changes as
detailed above.

## 2019-09-10 DIAGNOSIS — F419 Anxiety disorder, unspecified: Secondary | ICD-10-CM | POA: Diagnosis not present

## 2019-09-10 DIAGNOSIS — E039 Hypothyroidism, unspecified: Secondary | ICD-10-CM | POA: Diagnosis not present

## 2019-09-10 DIAGNOSIS — E2839 Other primary ovarian failure: Secondary | ICD-10-CM | POA: Diagnosis not present

## 2019-09-10 DIAGNOSIS — Z1389 Encounter for screening for other disorder: Secondary | ICD-10-CM | POA: Diagnosis not present

## 2019-09-10 DIAGNOSIS — E7800 Pure hypercholesterolemia, unspecified: Secondary | ICD-10-CM | POA: Diagnosis not present

## 2019-09-10 DIAGNOSIS — Z0000 Encounter for general adult medical examination without abnormal findings: Secondary | ICD-10-CM | POA: Diagnosis not present

## 2019-09-27 DIAGNOSIS — E7800 Pure hypercholesterolemia, unspecified: Secondary | ICD-10-CM | POA: Diagnosis not present

## 2019-10-05 ENCOUNTER — Ambulatory Visit: Payer: Medicare Other

## 2019-10-10 ENCOUNTER — Ambulatory Visit: Payer: Medicare Other

## 2020-02-13 DIAGNOSIS — H35373 Puckering of macula, bilateral: Secondary | ICD-10-CM | POA: Diagnosis not present

## 2020-02-13 DIAGNOSIS — H353132 Nonexudative age-related macular degeneration, bilateral, intermediate dry stage: Secondary | ICD-10-CM | POA: Diagnosis not present

## 2020-03-16 DIAGNOSIS — Z1231 Encounter for screening mammogram for malignant neoplasm of breast: Secondary | ICD-10-CM | POA: Diagnosis not present

## 2020-03-16 DIAGNOSIS — M8589 Other specified disorders of bone density and structure, multiple sites: Secondary | ICD-10-CM | POA: Diagnosis not present

## 2020-03-30 DIAGNOSIS — M810 Age-related osteoporosis without current pathological fracture: Secondary | ICD-10-CM | POA: Diagnosis not present

## 2020-03-30 DIAGNOSIS — K219 Gastro-esophageal reflux disease without esophagitis: Secondary | ICD-10-CM | POA: Diagnosis not present

## 2020-04-07 ENCOUNTER — Emergency Department (HOSPITAL_BASED_OUTPATIENT_CLINIC_OR_DEPARTMENT_OTHER)
Admission: EM | Admit: 2020-04-07 | Discharge: 2020-04-08 | Disposition: A | Discharge status: Home / Self Care | Payer: Medicare PPO | Attending: Emergency Medicine | Admitting: Emergency Medicine

## 2020-04-07 ENCOUNTER — Encounter (HOSPITAL_BASED_OUTPATIENT_CLINIC_OR_DEPARTMENT_OTHER): Payer: Self-pay

## 2020-04-07 ENCOUNTER — Emergency Department (HOSPITAL_BASED_OUTPATIENT_CLINIC_OR_DEPARTMENT_OTHER): Payer: Medicare PPO

## 2020-04-07 ENCOUNTER — Other Ambulatory Visit: Payer: Self-pay

## 2020-04-07 DIAGNOSIS — W108XXA Fall (on) (from) other stairs and steps, initial encounter: Secondary | ICD-10-CM | POA: Insufficient documentation

## 2020-04-07 DIAGNOSIS — Y9389 Activity, other specified: Secondary | ICD-10-CM | POA: Diagnosis not present

## 2020-04-07 DIAGNOSIS — S62324A Displaced fracture of shaft of fourth metacarpal bone, right hand, initial encounter for closed fracture: Secondary | ICD-10-CM | POA: Insufficient documentation

## 2020-04-07 DIAGNOSIS — S62336A Displaced fracture of neck of fifth metacarpal bone, right hand, initial encounter for closed fracture: Secondary | ICD-10-CM

## 2020-04-07 DIAGNOSIS — S2241XA Multiple fractures of ribs, right side, initial encounter for closed fracture: Secondary | ICD-10-CM | POA: Diagnosis not present

## 2020-04-07 DIAGNOSIS — S22009A Unspecified fracture of unspecified thoracic vertebra, initial encounter for closed fracture: Secondary | ICD-10-CM | POA: Diagnosis not present

## 2020-04-07 DIAGNOSIS — S62642A Nondisplaced fracture of proximal phalanx of right middle finger, initial encounter for closed fracture: Secondary | ICD-10-CM | POA: Diagnosis not present

## 2020-04-07 DIAGNOSIS — S62640A Nondisplaced fracture of proximal phalanx of right index finger, initial encounter for closed fracture: Secondary | ICD-10-CM | POA: Insufficient documentation

## 2020-04-07 DIAGNOSIS — Y998 Other external cause status: Secondary | ICD-10-CM | POA: Insufficient documentation

## 2020-04-07 DIAGNOSIS — S0101XA Laceration without foreign body of scalp, initial encounter: Secondary | ICD-10-CM | POA: Insufficient documentation

## 2020-04-07 DIAGNOSIS — Y9289 Other specified places as the place of occurrence of the external cause: Secondary | ICD-10-CM | POA: Insufficient documentation

## 2020-04-07 DIAGNOSIS — Z8739 Personal history of other diseases of the musculoskeletal system and connective tissue: Secondary | ICD-10-CM | POA: Insufficient documentation

## 2020-04-07 DIAGNOSIS — R519 Headache, unspecified: Secondary | ICD-10-CM | POA: Diagnosis not present

## 2020-04-07 DIAGNOSIS — S62654A Nondisplaced fracture of medial phalanx of right ring finger, initial encounter for closed fracture: Secondary | ICD-10-CM | POA: Diagnosis not present

## 2020-04-07 DIAGNOSIS — S0003XA Contusion of scalp, initial encounter: Secondary | ICD-10-CM

## 2020-04-07 DIAGNOSIS — S62614A Displaced fracture of proximal phalanx of right ring finger, initial encounter for closed fracture: Secondary | ICD-10-CM | POA: Diagnosis not present

## 2020-04-07 DIAGNOSIS — S2231XA Fracture of one rib, right side, initial encounter for closed fracture: Secondary | ICD-10-CM | POA: Insufficient documentation

## 2020-04-07 DIAGNOSIS — M7989 Other specified soft tissue disorders: Secondary | ICD-10-CM | POA: Diagnosis not present

## 2020-04-07 DIAGNOSIS — S62306A Unspecified fracture of fifth metacarpal bone, right hand, initial encounter for closed fracture: Secondary | ICD-10-CM | POA: Diagnosis not present

## 2020-04-07 DIAGNOSIS — M79641 Pain in right hand: Secondary | ICD-10-CM | POA: Diagnosis present

## 2020-04-07 MED ORDER — HYDROCODONE-ACETAMINOPHEN 5-325 MG PO TABS
2.0000 | ORAL_TABLET | Freq: Once | ORAL | Status: AC
  Administered 2020-04-07: 2 via ORAL
  Filled 2020-04-07: qty 2

## 2020-04-07 NOTE — ED Provider Notes (Addendum)
[facility] Provider Note: Audrey Spurling, MD, FACEP  CSN: [id number] MRN: [id number] ARRIVAL: 04/07/20 at 2125 ROOM: [place]   HISTORY OF PRESENT ILLNESS  04/07/20 11:18 PM Audrey [name] is a 74 y.o. female who fell backwards on stairs yesterday evening.  She is here now with pain in her right hand, her right ribs, and a laceration on the back of her head.  She did not have a loss of consciousness.  She is not on anticoagulation.  Her son, physician, stapled a laceration to the back of her head yesterday evening.  She rates her pain in her hand and ribs as a 10 out of 10, worse with movement, breathing, palpation or coughing.  She has not been vomiting.  She is at her usual mental status per her husband.   Past Medical History:  Diagnosis Date  . Allergy    seasonal allegies; seem worse this year  . Arthritis   . Cancer ([place])    basal cell cancer on face removed; Dr Ronnald Ramp  . Hyperlipidemia     Past Surgical History:  Procedure Laterality Date  . COLONOSCOPY  2012   neg; [facility]  . HEMORRHOID SURGERY      Family History  Problem Relation Age of Onset  . Stroke Maternal Grandmother   . Heart attack Mother        in 50s  . Uterine cancer Mother   . Heart attack Father        in 18s  . Heart attack Maternal Grandfather        in 13s  . Coronary artery disease Brother        stent in 35s  . Other Brother        ? leukemia  . Diabetes Neg Hx     Social History   Tobacco Use  . Smoking status: Never Smoker  . Smokeless tobacco: Never Used  Vaping Use  . Vaping Use: Never used  Substance Use Topics  . Alcohol use: No    Alcohol/week: 0.0 standard drinks  . Drug use: No    Prior to Admission medications   Medication Sig Start Date End Date Taking? Authorizing Provider  atorvastatin (LIPITOR) 20 MG tablet Take 1 tablet by mouth daily. 10/04/18   Binnie Rail, MD  Calcium Carbonate-Vitamin D (CALCIUM + D PO) Take  1,200 mg by mouth daily.    [provider]  cholecalciferol (VITAMIN D) 1000 UNITS tablet Take 1,000 Units by mouth every other day.      [provider]  diclofenac (VOLTAREN) 75 MG EC tablet Take by mouth 2 times daily AS NEEDED. Should be taken as infrequently as possible. 07/11/17   Binnie Rail, MD  FLUoxetine (PROZAC) 40 MG capsule TAKE 1 CAPSULE BY MOUTH EVERY DAY 10/04/18   Binnie Rail, MD  HYDROcodone-acetaminophen (NORCO) 10-325 MG tablet Take 1 tablet by mouth every 6 (six) hours as needed for severe pain (may cause constipation). 04/08/20   Eithel Ryall, MD  levothyroxine (SYNTHROID, LEVOTHROID) 25 MCG tablet TAKE 1 TABLET BY MOUTH BEFORE BREAKFAST 10/04/18   Burns, Claudina Lick, MD  nystatin-triamcinolone (MYCOLOG II) cream Apply 1 application topically as needed.    [provider]  ondansetron (ZOFRAN ODT) 8 MG disintegrating tablet Take 1 tablet (8 mg total) by mouth every 8 (eight) hours as needed for nausea or vomiting. 04/08/20   Jianna Drabik, Jenny Reichmann, MD  triamcinolone (NASACORT) 27  MCG/ACT AERO nasal inhaler Place 1 spray into the nose daily.    [provider]    Allergies Celecoxib and Sulfonamide derivatives   REVIEW OF SYSTEMS  Negative except as noted here or in the History of Present Illness.   PHYSICAL EXAMINATION  Initial Vital Signs Blood pressure 133/75, pulse 68, temperature 98.3 F (36.8 C), temperature source Oral, resp. rate 18, SpO2 98 %.  Examination General: Well-developed, well-nourished female in no acute distress; appearance consistent with age of record HENT: normocephalic; sutured laceration right occipital parietal region with small underlying hematoma Eyes: pupils equal, round and reactive to light; extraocular muscles intact Neck: supple; nontender Heart: regular rate and rhythm Lungs: clear to auscultation bilaterally Chest: Right lower rib tenderness without deformity or crepitus Abdomen: soft; nondistended;  nontender; bowel sounds present Extremities: No deformity; pulses normal; swelling, ecchymosis and tenderness of right hand, right hand distally neurovascularly intact with intact tendon function:    Neurologic: Awake, alert and oriented; motor function intact in all extremities and symmetric; no facial droop Skin: Warm and dry Psychiatric: Normal mood and affect   RESULTS  Summary of this visit's results, reviewed and interpreted by myself:   EKG Interpretation  Date/Time:    Ventricular Rate:    PR Interval:    QRS Duration:   QT Interval:    QTC Calculation:   R Axis:     Text Interpretation:        Laboratory Studies: No results found for this or any previous visit (from the past 24 hour(s)). Imaging Studies: DG Ribs Unilateral W/Chest Right  Result Date: 04/07/2020 CLINICAL DATA:  Right lower rib pain after a fall down stairs. EXAM: RIGHT RIBS AND CHEST - 3+ VIEW COMPARISON:  05/08/2015 FINDINGS: Normal heart size and pulmonary vascularity. Lungs are clear. No pneumothorax. Mediastinal contours appear intact. Gas is demonstrated beneath the right hemidiaphragm likely representing colonic interposition. Mildly displaced acute appearing fractures of the right third, fourth, fifth, sixth, seventh, and eighth ribs. IMPRESSION: Multiple acute right rib fractures. No pneumothorax. No evidence of active pulmonary disease. Electronically Signed   By: Lucienne Capers M.D.   On: 04/07/2020 22:35   CT Head Wo Contrast  Result Date: 04/07/2020 CLINICAL DATA:  Fall down stairs last night posterior scalp pain EXAM: CT HEAD WITHOUT CONTRAST TECHNIQUE: Contiguous axial images were obtained from the base of the skull through the vertex without intravenous contrast. COMPARISON:  None. FINDINGS: Brain: No evidence of acute infarction, hemorrhage, hydrocephalus, extra-axial collection or mass lesion/mass effect. Symmetric prominence of the ventricles, cisterns and sulci compatible with parenchymal  volume loss. Patchy areas of white matter hypoattenuation are most compatible with chronic microvascular angiopathy. Vascular: Atherosclerotic calcification of the carotid siphons. No hyperdense vessel. Skull: Few surgical staples project over the right parietal scalp with minimal subjacent soft tissue thickening and a trace 1-2 mm crescentic subgaleal hematoma. No other significant scalp swelling or hematoma is seen. No visible calvarial fracture or acute facial bone fracture within the margins of imaging. Sinuses/Orbits: Paranasal sinuses and mastoid air cells are predominantly clear. Included orbital structures are unremarkable. Other: None IMPRESSION: 1. Few surgical staples project over the right parietal scalp with minimal subjacent soft tissue thickening and a trace 1-2 mm crescentic subgaleal hematoma. 2. No subjacent calvarial fracture or other acute osseous injury. 3. No acute intracranial abnormality. 4. Chronic microvascular angiopathy and parenchymal volume loss. Electronically Signed   By: Lovena Le M.D.   On: 04/07/2020 23:38   CT Chest Wo  Contrast  Result Date: 04/07/2020 CLINICAL DATA:  Rib fracture suspected, fall down stairs last night with posterior right rib pain EXAM: CT CHEST WITHOUT CONTRAST TECHNIQUE: Multidetector CT imaging of the chest was performed following the standard protocol without IV contrast. COMPARISON:  Radiograph 04/07/2020 FINDINGS: Cardiovascular: Limited evaluation of the vasculature in the absence of contrast media. The aortic root is suboptimally assessed given cardiac pulsation artifact. Ascending thoracic aorta dilated to 4 cm, returning to a normal caliber of 2.6 cm by the distal aortic arch. Atherosclerotic plaque present throughout the thoracic aorta. No periaortic stranding. No hyperdense mural thickening or plaque displacement to suggest intramural hematoma. Minimal plaque in the proximal great vessels. Central pulmonary arteries are normal caliber. Normal  heart size. No pericardial effusion. Mediastinum/Nodes: No mediastinal fluid or gas. Normal thyroid gland and thoracic inlet. No acute abnormality of the trachea or esophagus. No worrisome mediastinal or axillary adenopathy. Hilar nodal evaluation is limited in the absence of intravenous contrast media. Lungs/Pleura: There are dependent and subsegmental atelectatic changes in both lungs, right significantly greater than left likely related to splinting and chest wall pain. No pneumothorax or pleural effusion. No direct traumatic injury of the lung parenchyma is evident. There is a background of mild centrilobular emphysematous change. Diffuse scattered airways thickening and secretions is noted as well. No concerning pulmonary nodules or masses. Upper Abdomen: Interposition of the hepatic flexure anterior to the liver, nonspecific and likely benign in the absence of symptoms. Hyperdense material layering dependently within the gallbladder can reflect partially calcified gallstones and biliary sludge. No pericholecystic inflammation. No other acute abnormalities present in the visualized portions of the upper abdomen. Musculoskeletal: The osseous structures appear diffusely demineralized which may limit detection of small or nondisplaced fractures. Displaced lateral right third rib fracture. Additional fractures of the posterolateral fifth through ninth ribs. Additional minimally displaced posterior right third through tenth rib fractures posteriorly as well as fracture of the sixth through ninth right transverse processes. Suspect additional remote nondisplaced fourth through sixth rib fractures anteriorly with mild sclerosis. There is mild levocurvature of the thoracic spine with an apex at the T4-5 level and dextrocurvature of the thoracolumbar junction. Exaggerated thoracic kyphosis as well. Multilevel degenerative changes are present in the imaged portions of the spine. Features most pronounced in the cervical  spine and towards the thoracolumbar junction. No large body wall hematoma or acute muscular abnormality is seen. IMPRESSION: 1. Displaced lateral right third rib fracture. Minimally displaced posterolateral right fifth through ninth rib fractures. Additional minimally displaced posterior right third through tenth rib fractures result in multilevel contiguous segmental fractures. Correlate for flail chest morphology of the posterolateral chest wall. 2. Minimally displaced fractures of the T6-T9 right transverse processes. 3. Suspect additional remote nondisplaced fourth through sixth rib fractures anteriorly with mild sclerosis. 4. No no direct traumatic injury of the lung parenchyma, pneumothorax or effusion. 5. Dependent and subsegmental atelectatic changes in both lungs, right significantly greater than left, likely related to splinting and chest wall pain. 6. Dilatation of the ascending thoracic aorta to 4 cm. No acute thoracic aortic abnormality is seen. Recommend annual imaging followup by CTA or MRA. This recommendation follows 2010 ACCF/AHA/AATS/ACR/ASA/SCA/SCAI/SIR/STS/SVM Guidelines for the Diagnosis and Management of Patients with Thoracic Aortic Disease. Circulation. 2010; 121: N829-F621. Aortic aneurysm NOS (ICD10-I71.9) 7. Aortic Atherosclerosis (ICD10-I70.0). Electronically Signed   By: Lovena Le M.D.   On: 04/07/2020 23:53   DG Hand Complete Right  Result Date: 04/07/2020 CLINICAL DATA:  [place] down stairs today. Pain,  swelling, and bruising of the right hand with limited range of motion. EXAM: RIGHT HAND - COMPLETE 3+ VIEW COMPARISON:  None. FINDINGS: Oblique fracture of the midshaft fourth metacarpal with lateral displacement and medial angulation of the distal fracture fragment. Comminuted fractures of the distal fifth metacarpal bone with mild volar angulation and impaction. Fracture lines appear to extend to the articular surface. Oblique fractures of the lateral aspect base of the proximal  phalanges of the second and third fingers with extension to the articular surface. Possible nondisplaced volar plate fracture of the middle phalanx of the fourth finger. Degenerative changes in the interphalangeal joints, first metacarpal phalangeal joint, first carpometacarpal joint, and STT joints. Diffuse soft tissue swelling. IMPRESSION: 1. Acute fractures of the fourth and fifth metacarpal bones with intra-articular extension in the fifth metacarpal. 2. Oblique fractures of the lateral base of the proximal phalanges of the second and third fingers with extension to the articular surface. 3. Possible nondisplaced volar plate fracture of the middle phalanx of the fourth finger. Electronically Signed   By: Lucienne Capers M.D.   On: 04/07/2020 22:30    ED COURSE and MDM  Nursing notes, initial and subsequent vitals signs, including pulse oximetry, reviewed and interpreted by myself.  Vitals:   04/07/20 2144 04/07/20 2356  BP: 133/75 121/73  Pulse: 68 64  Resp: 18 18  Temp: 98.3 F (36.8 C) 98.6 F (37 C)  TempSrc: Oral Oral  SpO2: 98% 96%   Medications  HYDROcodone-acetaminophen (NORCO/VICODIN) 5-325 MG per tablet 2 tablet (2 tablets Oral Given 04/07/20 2359)    11:53 PM Right wrist splinted and patient placed in sling.  Will refer to hand surgeon on-call.  Patient given an incentive spirometer to help keep lungs open and the setting of multiple rib fractures.  Patient does not wish admission to the hospital at this time.  She was advised to return if symptoms worsen or breathing becomes difficult.  PROCEDURES  Procedures  CRITICAL CARE Performed by: Karen Chafe Devanny Palecek Total critical care time: 35 minutes Critical care time was exclusive of separately billable procedures and treating other patients. Critical care was necessary to treat or prevent imminent or life-threatening deterioration. Critical care was time spent personally by me on the following activities: development of treatment  plan with patient and/or surrogate as well as nursing, discussions with consultants, evaluation of patient's response to treatment, examination of patient, obtaining history from patient or surrogate, ordering and performing treatments and interventions, ordering and review of laboratory studies, ordering and review of radiographic studies, pulse oximetry and re-evaluation of patient's condition.   ED DIAGNOSES     ICD-10-CM   1. Fall down stairs, initial encounter  W10.8XXA   2. Laceration of scalp, initial encounter  S01.01XA   3. Scalp hematoma, initial encounter  S00.03XA   4. Closed displaced fracture of neck of fifth metacarpal bone of right hand, initial encounter  S62.336A   5. Closed displaced fracture of shaft of fourth metacarpal bone of right hand, initial encounter  S62.324A   6. Closed nondisplaced fracture of middle phalanx of right ring finger, initial encounter  S62.654A   7. Closed nondisplaced fracture of proximal phalanx of right index finger, initial encounter  S62.640A   8. Closed nondisplaced fracture of proximal phalanx of right middle finger, initial encounter  S62.642A   9. Closed traumatic displaced fracture of one rib of right side  S22.31XA   10. Closed traumatic nondisplaced fracture of five ribs of right side, initial encounter  S22.41XA   11. Closed fracture of transverse process of thoracic vertebra, initial encounter ([place])  S22.009A        Devyn Sheerin, MD 04/08/20 0014    Shanon Rosser, MD 04/08/20 8206

## 2020-04-07 NOTE — ED Triage Notes (Addendum)
Pt states she fell down stairs last night-pain to right hand, right rib area and back of head-denies LOC/no blood thinners-pt's states her son stapled head lac last night-NAD-to triage in w/c

## 2020-04-08 DIAGNOSIS — S62336A Displaced fracture of neck of fifth metacarpal bone, right hand, initial encounter for closed fracture: Secondary | ICD-10-CM | POA: Diagnosis not present

## 2020-04-08 DIAGNOSIS — S62306A Unspecified fracture of fifth metacarpal bone, right hand, initial encounter for closed fracture: Secondary | ICD-10-CM | POA: Diagnosis not present

## 2020-04-08 DIAGNOSIS — M79641 Pain in right hand: Secondary | ICD-10-CM | POA: Diagnosis not present

## 2020-04-08 DIAGNOSIS — S62324A Displaced fracture of shaft of fourth metacarpal bone, right hand, initial encounter for closed fracture: Secondary | ICD-10-CM | POA: Diagnosis not present

## 2020-04-08 MED ORDER — HYDROCODONE-ACETAMINOPHEN 10-325 MG PO TABS: 1.0000 | ORAL_TABLET | Freq: Four times a day (QID) | ORAL | 0 refills | Status: DC | PRN

## 2020-04-08 MED ORDER — ONDANSETRON 8 MG PO TBDP: 8.0000 mg | ORAL_TABLET | Freq: Three times a day (TID) | ORAL | 1 refills | Status: DC | PRN

## 2020-04-08 NOTE — ED Notes (Signed)
PT demonstrated Incentive spirometer well. Instructed to use 10  times per hour while awake.

## 2020-04-08 NOTE — ED Notes (Signed)
Right hand bruising, Right rib pain, Posterior head laceration with staples in place. Pt sustained fall yesterday evening. Alert, amb with assist.

## 2020-04-09 ENCOUNTER — Other Ambulatory Visit: Payer: Self-pay

## 2020-04-09 ENCOUNTER — Other Ambulatory Visit (HOSPITAL_COMMUNITY)
Admission: RE | Admit: 2020-04-09 | Discharge: 2020-04-09 | Disposition: A | Discharge status: Home / Self Care | Payer: Medicare PPO | Source: Ambulatory Visit | Attending: Orthopedic Surgery | Admitting: Orthopedic Surgery

## 2020-04-09 ENCOUNTER — Encounter (HOSPITAL_COMMUNITY): Payer: Self-pay | Admitting: Orthopedic Surgery

## 2020-04-09 DIAGNOSIS — Z20822 Contact with and (suspected) exposure to covid-19: Secondary | ICD-10-CM | POA: Diagnosis not present

## 2020-04-09 DIAGNOSIS — Z01812 Encounter for preprocedural laboratory examination: Secondary | ICD-10-CM | POA: Insufficient documentation

## 2020-04-09 LAB — SARS CORONAVIRUS 2 (TAT 6-24 HRS): SARS Coronavirus 2: NEGATIVE

## 2020-04-09 NOTE — Progress Notes (Signed)
Pt denies SOB, chest pain, and being under the care of a cardiologist. Pt stated that PCP is Dr. Carol Ada.  Pt denies having a stress test, echo and cardiac cath.  Pt denies having an EKG and chest x ray.  Pt denies recent labs.  Pt made aware to stop taking Aspirin (unless otherwise advised by surgeon), vitamins, fish oil and herbal medications.  Do not take any NSAIDs ie: Ibuprofen, Advil, Naproxen (Aleve), Motrin, BC and Goody Powder.  Pt reminded to continue to quarantine. Pt verbalized understanding of all pre-op instructions.

## 2020-04-10 ENCOUNTER — Ambulatory Visit (HOSPITAL_COMMUNITY): Payer: Medicare PPO | Admitting: Certified Registered"

## 2020-04-10 ENCOUNTER — Encounter (HOSPITAL_COMMUNITY): Payer: Self-pay | Admitting: Orthopedic Surgery

## 2020-04-10 ENCOUNTER — Ambulatory Visit (HOSPITAL_COMMUNITY)
Admission: RE | Admit: 2020-04-10 | Discharge: 2020-04-10 | Disposition: A | Discharge status: Home / Self Care | Payer: Medicare PPO | Attending: Orthopedic Surgery | Admitting: Orthopedic Surgery

## 2020-04-10 ENCOUNTER — Encounter (HOSPITAL_COMMUNITY)
Admission: RE | Disposition: A | Discharge status: Home / Self Care | Payer: Self-pay | Source: Home / Self Care | Attending: Orthopedic Surgery

## 2020-04-10 DIAGNOSIS — Z8249 Family history of ischemic heart disease and other diseases of the circulatory system: Secondary | ICD-10-CM | POA: Diagnosis not present

## 2020-04-10 DIAGNOSIS — E039 Hypothyroidism, unspecified: Secondary | ICD-10-CM | POA: Diagnosis not present

## 2020-04-10 DIAGNOSIS — S62324A Displaced fracture of shaft of fourth metacarpal bone, right hand, initial encounter for closed fracture: Secondary | ICD-10-CM | POA: Insufficient documentation

## 2020-04-10 DIAGNOSIS — M8580 Other specified disorders of bone density and structure, unspecified site: Secondary | ICD-10-CM | POA: Insufficient documentation

## 2020-04-10 DIAGNOSIS — Z85828 Personal history of other malignant neoplasm of skin: Secondary | ICD-10-CM | POA: Insufficient documentation

## 2020-04-10 DIAGNOSIS — Z823 Family history of stroke: Secondary | ICD-10-CM | POA: Insufficient documentation

## 2020-04-10 DIAGNOSIS — Z79899 Other long term (current) drug therapy: Secondary | ICD-10-CM | POA: Insufficient documentation

## 2020-04-10 DIAGNOSIS — K219 Gastro-esophageal reflux disease without esophagitis: Secondary | ICD-10-CM | POA: Diagnosis not present

## 2020-04-10 DIAGNOSIS — W109XXA Fall (on) (from) unspecified stairs and steps, initial encounter: Secondary | ICD-10-CM | POA: Insufficient documentation

## 2020-04-10 DIAGNOSIS — S62394A Other fracture of fourth metacarpal bone, right hand, initial encounter for closed fracture: Secondary | ICD-10-CM | POA: Diagnosis not present

## 2020-04-10 DIAGNOSIS — S62392A Other fracture of third metacarpal bone, right hand, initial encounter for closed fracture: Secondary | ICD-10-CM | POA: Diagnosis not present

## 2020-04-10 DIAGNOSIS — Z888 Allergy status to other drugs, medicaments and biological substances status: Secondary | ICD-10-CM | POA: Diagnosis not present

## 2020-04-10 DIAGNOSIS — M1990 Unspecified osteoarthritis, unspecified site: Secondary | ICD-10-CM | POA: Diagnosis not present

## 2020-04-10 DIAGNOSIS — E785 Hyperlipidemia, unspecified: Secondary | ICD-10-CM | POA: Insufficient documentation

## 2020-04-10 DIAGNOSIS — S62396A Other fracture of fifth metacarpal bone, right hand, initial encounter for closed fracture: Secondary | ICD-10-CM | POA: Diagnosis not present

## 2020-04-10 DIAGNOSIS — Y939 Activity, unspecified: Secondary | ICD-10-CM | POA: Diagnosis not present

## 2020-04-10 DIAGNOSIS — S62304A Unspecified fracture of fourth metacarpal bone, right hand, initial encounter for closed fracture: Secondary | ICD-10-CM | POA: Diagnosis present

## 2020-04-10 DIAGNOSIS — Z882 Allergy status to sulfonamides status: Secondary | ICD-10-CM | POA: Insufficient documentation

## 2020-04-10 DIAGNOSIS — Z8049 Family history of malignant neoplasm of other genital organs: Secondary | ICD-10-CM | POA: Insufficient documentation

## 2020-04-10 DIAGNOSIS — S62306A Unspecified fracture of fifth metacarpal bone, right hand, initial encounter for closed fracture: Secondary | ICD-10-CM | POA: Diagnosis not present

## 2020-04-10 HISTORY — DX: Other injury of unspecified body region, initial encounter: T14.8XXA

## 2020-04-10 HISTORY — DX: Hypothyroidism, unspecified: E03.9

## 2020-04-10 HISTORY — DX: Other specified disorders of bone density and structure, unspecified site: M85.80

## 2020-04-10 HISTORY — DX: Pneumonia, unspecified organism: J18.9

## 2020-04-10 HISTORY — DX: Presence of spectacles and contact lenses: Z97.3

## 2020-04-10 HISTORY — PX: OPEN REDUCTION INTERNAL FIXATION (ORIF) METACARPAL: SHX6234

## 2020-04-10 HISTORY — DX: Asymptomatic menopausal state: Z78.0

## 2020-04-10 HISTORY — DX: Gastro-esophageal reflux disease without esophagitis: K21.9

## 2020-04-10 HISTORY — DX: Unspecified hearing loss, unspecified ear: H91.90

## 2020-04-10 HISTORY — DX: Presence of external hearing-aid: Z97.4

## 2020-04-10 LAB — CBC
HCT: 36.1 % (ref 36.0–46.0)
Hemoglobin: 11.6 g/dL — ABNORMAL LOW (ref 12.0–15.0)
MCH: 31.3 pg (ref 26.0–34.0)
MCHC: 32.1 g/dL (ref 30.0–36.0)
MCV: 97.3 fL (ref 80.0–100.0)
Platelets: 189 10*3/uL (ref 150–400)
RBC: 3.71 MIL/uL — ABNORMAL LOW (ref 3.87–5.11)
RDW: 13.4 % (ref 11.5–15.5)
WBC: 4.9 10*3/uL (ref 4.0–10.5)
nRBC: 0 % (ref 0.0–0.2)

## 2020-04-10 SURGERY — OPEN REDUCTION INTERNAL FIXATION (ORIF) METACARPAL
Anesthesia: Monitor Anesthesia Care | Site: Finger | Laterality: Right

## 2020-04-10 MED ORDER — FENTANYL CITRATE (PF) 100 MCG/2ML IJ SOLN: 25.0000 ug | INTRAMUSCULAR | Status: DC | PRN

## 2020-04-10 MED ORDER — CHLORHEXIDINE GLUCONATE 0.12 % MT SOLN
15.0000 mL | Freq: Once | OROMUCOSAL | Status: AC
  Administered 2020-04-10: 15 mL via OROMUCOSAL
  Filled 2020-04-10: qty 15

## 2020-04-10 MED ORDER — BUPIVACAINE HCL (PF) 0.25 % IJ SOLN
INTRAMUSCULAR | Status: AC
  Filled 2020-04-10: qty 30

## 2020-04-10 MED ORDER — CEFAZOLIN SODIUM-DEXTROSE 2-4 GM/100ML-% IV SOLN
2.0000 g | INTRAVENOUS | Status: AC
  Administered 2020-04-10: 2 g via INTRAVENOUS
  Filled 2020-04-10: qty 100

## 2020-04-10 MED ORDER — OXYCODONE HCL 5 MG PO TABS: 5.0000 mg | ORAL_TABLET | Freq: Once | ORAL | Status: DC | PRN

## 2020-04-10 MED ORDER — PHENYLEPHRINE HCL-NACL 10-0.9 MG/250ML-% IV SOLN
INTRAVENOUS | Status: DC | PRN
  Administered 2020-04-10: 30 ug/min via INTRAVENOUS

## 2020-04-10 MED ORDER — OXYCODONE HCL 5 MG/5ML PO SOLN: 5.0000 mg | Freq: Once | ORAL | Status: DC | PRN

## 2020-04-10 MED ORDER — FENTANYL CITRATE (PF) 250 MCG/5ML IJ SOLN
INTRAMUSCULAR | Status: DC | PRN
  Administered 2020-04-10: 50 ug via INTRAVENOUS

## 2020-04-10 MED ORDER — 0.9 % SODIUM CHLORIDE (POUR BTL) OPTIME
TOPICAL | Status: DC | PRN
  Administered 2020-04-10: 1000 mL

## 2020-04-10 MED ORDER — FENTANYL CITRATE (PF) 250 MCG/5ML IJ SOLN
INTRAMUSCULAR | Status: AC
  Filled 2020-04-10: qty 5

## 2020-04-10 MED ORDER — PROPOFOL 500 MG/50ML IV EMUL
INTRAVENOUS | Status: DC | PRN
  Administered 2020-04-10: 100 ug/kg/min via INTRAVENOUS

## 2020-04-10 MED ORDER — ONDANSETRON HCL 4 MG/2ML IJ SOLN: 4.0000 mg | Freq: Once | INTRAMUSCULAR | Status: DC | PRN

## 2020-04-10 MED ORDER — PHENYLEPHRINE 40 MCG/ML (10ML) SYRINGE FOR IV PUSH (FOR BLOOD PRESSURE SUPPORT)
PREFILLED_SYRINGE | INTRAVENOUS | Status: AC
  Filled 2020-04-10: qty 10

## 2020-04-10 MED ORDER — ORAL CARE MOUTH RINSE: 15.0000 mL | Freq: Once | OROMUCOSAL | Status: AC

## 2020-04-10 MED ORDER — PROPOFOL 10 MG/ML IV BOLUS
INTRAVENOUS | Status: AC
  Filled 2020-04-10: qty 20

## 2020-04-10 MED ORDER — MIDAZOLAM HCL 2 MG/2ML IJ SOLN
INTRAMUSCULAR | Status: DC | PRN
Start: 2020-04-10 — End: 2020-04-10
  Administered 2020-04-10: 1 mg via INTRAVENOUS

## 2020-04-10 MED ORDER — MIDAZOLAM HCL 2 MG/2ML IJ SOLN
INTRAMUSCULAR | Status: AC
  Filled 2020-04-10: qty 2

## 2020-04-10 MED ORDER — PHENYLEPHRINE 40 MCG/ML (10ML) SYRINGE FOR IV PUSH (FOR BLOOD PRESSURE SUPPORT)
PREFILLED_SYRINGE | INTRAVENOUS | Status: DC | PRN
  Administered 2020-04-10: 160 ug via INTRAVENOUS
  Administered 2020-04-10: 120 ug via INTRAVENOUS
  Administered 2020-04-10 (×3): 80 ug via INTRAVENOUS

## 2020-04-10 MED ORDER — LACTATED RINGERS IV SOLN: INTRAVENOUS | Status: DC

## 2020-04-10 SURGICAL SUPPLY — 55 items
BLADE CLIPPER SURG (BLADE) IMPLANT
BNDG ADH 5X2 AIR PERM ELC (GAUZE/BANDAGES/DRESSINGS) ×1
BNDG CMPR 9X4 STRL LF SNTH (GAUZE/BANDAGES/DRESSINGS) ×1
BNDG COHESIVE 2X5 WHT NS (GAUZE/BANDAGES/DRESSINGS) ×2 IMPLANT
BNDG ELASTIC 3X5.8 VLCR STR LF (GAUZE/BANDAGES/DRESSINGS) ×3 IMPLANT
BNDG ELASTIC 4X5.8 VLCR STR LF (GAUZE/BANDAGES/DRESSINGS) ×3 IMPLANT
BNDG ESMARK 4X9 LF (GAUZE/BANDAGES/DRESSINGS) ×3 IMPLANT
BNDG GAUZE ELAST 4 BULKY (GAUZE/BANDAGES/DRESSINGS) ×9 IMPLANT
CORD BIPOLAR FORCEPS 12FT (ELECTRODE) ×3 IMPLANT
COVER SURGICAL LIGHT HANDLE (MISCELLANEOUS) ×3 IMPLANT
COVER WAND RF STERILE (DRAPES) ×3 IMPLANT
CUFF TOURN SGL QUICK 18X4 (TOURNIQUET CUFF) ×3 IMPLANT
CUFF TOURN SGL QUICK 24 (TOURNIQUET CUFF)
CUFF TRNQT CYL 24X4X16.5-23 (TOURNIQUET CUFF) IMPLANT
DRAIN TLS ROUND 10FR (DRAIN) IMPLANT
DRAPE OEC MINIVIEW 54X84 (DRAPES) IMPLANT
DRAPE SURG 17X23 STRL (DRAPES) ×3 IMPLANT
GAUZE SPONGE 4X4 12PLY STRL (GAUZE/BANDAGES/DRESSINGS) ×3 IMPLANT
GAUZE SPONGE 4X4 12PLY STRL LF (GAUZE/BANDAGES/DRESSINGS) ×2 IMPLANT
GAUZE XEROFORM 1X8 LF (GAUZE/BANDAGES/DRESSINGS) ×3 IMPLANT
GLOVE BIOGEL M 8.0 STRL (GLOVE) ×3 IMPLANT
GLOVE SS BIOGEL STRL SZ 8 (GLOVE) ×1 IMPLANT
GLOVE SUPERSENSE BIOGEL SZ 8 (GLOVE) ×2
GOWN STRL REUS W/ TWL LRG LVL3 (GOWN DISPOSABLE) ×1 IMPLANT
GOWN STRL REUS W/ TWL XL LVL3 (GOWN DISPOSABLE) ×1 IMPLANT
GOWN STRL REUS W/TWL LRG LVL3 (GOWN DISPOSABLE) ×3
GOWN STRL REUS W/TWL XL LVL3 (GOWN DISPOSABLE) ×3
KIT BASIN OR (CUSTOM PROCEDURE TRAY) ×3 IMPLANT
KIT INNATE INSTRUMENT FOR 3.6 (INSTRUMENTS) ×2 IMPLANT
KIT TURNOVER KIT B (KITS) ×3 IMPLANT
MANIFOLD NEPTUNE II (INSTRUMENTS) ×3 IMPLANT
NAIL IM THRD INNATE 3.6.40 (Nail) ×2 IMPLANT
NEEDLE 22X1 1/2 (OR ONLY) (NEEDLE) IMPLANT
NS IRRIG 1000ML POUR BTL (IV SOLUTION) ×3 IMPLANT
PACK ORTHO EXTREMITY (CUSTOM PROCEDURE TRAY) ×3 IMPLANT
PAD ARMBOARD 7.5X6 YLW CONV (MISCELLANEOUS) ×6 IMPLANT
PAD CAST 3X4 CTTN HI CHSV (CAST SUPPLIES) ×1 IMPLANT
PAD CAST 4YDX4 CTTN HI CHSV (CAST SUPPLIES) ×1 IMPLANT
PADDING CAST COTTON 2X4 NS (CAST SUPPLIES) ×2 IMPLANT
PADDING CAST COTTON 3X4 STRL (CAST SUPPLIES) ×3
PADDING CAST COTTON 4X4 STRL (CAST SUPPLIES) ×3
SOL PREP POV-IOD 4OZ 10% (MISCELLANEOUS) ×6 IMPLANT
SPONGE LAP 4X18 RFD (DISPOSABLE) IMPLANT
SUT MNCRL AB 4-0 PS2 18 (SUTURE) ×3 IMPLANT
SUT PROLENE 3 0 PS 2 (SUTURE) IMPLANT
SUT PROLENE 4 0 P 3 18 (SUTURE) ×2 IMPLANT
SUT VIC AB 3-0 FS2 27 (SUTURE) IMPLANT
SYR CONTROL 10ML LL (SYRINGE) IMPLANT
SYSTEM CHEST DRAIN TLS 7FR (DRAIN) IMPLANT
TOWEL GREEN STERILE (TOWEL DISPOSABLE) ×3 IMPLANT
TOWEL GREEN STERILE FF (TOWEL DISPOSABLE) ×3 IMPLANT
TUBE CONNECTING 12'X1/4 (SUCTIONS) ×1
TUBE CONNECTING 12X1/4 (SUCTIONS) ×2 IMPLANT
TUBE EVACUATION TLS (MISCELLANEOUS) ×3 IMPLANT
WATER STERILE IRR 1000ML POUR (IV SOLUTION) ×3 IMPLANT

## 2020-04-10 NOTE — Op Note (Signed)
Operative note 04/10/2020  Audrey [name]  Preoperative diagnosis: Right fourth and fifth metacarpal fractures closed with angulation  Postop diagnosis same  Procedure closed reduction right fifth metacarpal fracture under anesthesia #2 ORIF ring finger metacarpal fracture with a 40 mm nail #3 5 view radiographic series right hand  Surgeon Audrey [name]  Anesthesia block with IV sedation  Estimated blood loss minimal  Tourniquet time less than 30 minutes  Description of procedure patient was seen by myself underwent thorough counseling session followed by prep and drape with Hibiclens prescrub followed by 10-minute surgical Betadine scrub and paint.  Following this the patient underwent fluoroscopy and timeout.  Fractures were identified.  At this time introduced a guidewire with a small stab incision of the ring finger metacarpal.  Guidewire was placed across the fracture site it was measured seated and then I introduced a 40 mm and nail patient tolerated this well this allowed for coaptation of the bones so as to stabilize them in a nice fashion.  AP lateral and oblique x-rays were reviewed and looked excellent.  I was pleased with the findings.  Following this a performed gentle closure reduction of the small finger/fifth metacarpal fracture and the alignment was satisfactory.  Given the very distal nature of this metacarpal fracture I did not place any fixation.  I feel that this will heal.  Via stabilization of the ring finger metacarpal the fifth metacarpal does have better inherent stability for closed treatment.  Patient tolerated this well the stab incision for the nail entrance was closed with Prolene Adaptic Xeroform 4 x 4's and a volar splint was applied there were no complicating features.  All sponge needle and instrument counts were reported as correct.  Patient be monitored closely [place] home and see me back in 10 to 14 days.  I discussed all issues with her husband and all  questions have been encouraged and answered.  We will plan for 4 weeks of mobilization then therapy algorithm for her to restore motion to the fingers.  Lennell Shanks MD

## 2020-04-10 NOTE — Anesthesia Procedure Notes (Signed)
Procedure Name: MAC Date/Time: 04/10/2020 7:45 AM Performed by: Barrington Ellison, CRNA Pre-anesthesia Checklist: Patient identified, Emergency Drugs available, Suction available and Patient being monitored Patient Re-evaluated:Patient Re-evaluated prior to induction Oxygen Delivery Method: Simple face mask

## 2020-04-10 NOTE — Anesthesia Procedure Notes (Signed)
Anesthesia Regional Block: Supraclavicular block   Pre-Anesthetic Checklist: ,, timeout performed, Correct Patient, Correct Site, Correct Laterality, Correct Procedure, Correct Position, site marked, Risks and benefits discussed,  Surgical consent,  Pre-op evaluation,  At surgeon's request and post-op pain management  Laterality: Right  Prep: chloraprep       Needles:  Injection technique: Single-shot  Needle Type: Stimulator Needle - 80          Additional Needles:   Procedures:, nerve stimulator,,,,,,,  Narrative:  Start time: 04/10/2020 7:00 AM End time: 04/10/2020 7:10 AM Injection made incrementally with aspirations every 5 mL.  Performed by: Personally   Additional Notes: 20 cc 0.75% Ropivacaine

## 2020-04-10 NOTE — Discharge Instructions (Signed)

## 2020-04-10 NOTE — Transfer of Care (Signed)
Immediate Anesthesia Transfer of Care Note  Patient: Audrey [name]  Procedure(s) Performed: OPEN REDUCTION INTERNAL FIXATION (ORIF)  AS NECESSARY RIGHT FOURTH AND FIFTH METACARPAL FRACTURES (Right Finger)  Patient Location: PACU  Anesthesia Type:MAC and Regional  Level of Consciousness: drowsy and responds to stimulation  Airway & Oxygen Therapy: Patient Spontanous Breathing and Patient connected to face mask oxygen  Post-op Assessment: Report given to RN  Post vital signs: Reviewed and stable  Last Vitals:  Vitals Value Taken Time  BP 103/67 04/10/20 0843  Temp    Pulse 60 04/10/20 0844  Resp 15 04/10/20 0844  SpO2 100 % 04/10/20 0844  Vitals shown include unvalidated device data.  Last Pain:  Vitals:   04/10/20 0726  TempSrc:   PainSc: 0-No pain         Complications: No complications documented.

## 2020-04-10 NOTE — Anesthesia Preprocedure Evaluation (Addendum)
Anesthesia Evaluation  Patient identified by MRN, date of birth, ID band Patient awake    Reviewed: Allergy & Precautions, NPO status , Patient's Chart, lab work & pertinent test results  Airway Mallampati: II  TM Distance: >3 FB Neck ROM: Full    Dental  (+) Teeth Intact, Dental Advisory Given   Pulmonary    breath sounds clear to auscultation       Cardiovascular  Rhythm:Regular Rate:Normal     Neuro/Psych    GI/Hepatic   Endo/Other    Renal/GU      Musculoskeletal   Abdominal   Peds  Hematology   Anesthesia Other Findings   Reproductive/Obstetrics                             Anesthesia Physical Anesthesia Plan  ASA: II  Anesthesia Plan: MAC and Regional   Post-op Pain Management:    Induction:   PONV Risk Score and Plan: Ondansetron and Dexamethasone  Airway Management Planned: Natural Airway  Additional Equipment:   Intra-op Plan:   Post-operative Plan: Extubation in OR  Informed Consent: I have reviewed the patients History and Physical, chart, labs and discussed the procedure including the risks, benefits and alternatives for the proposed anesthesia with the patient or authorized representative who has indicated his/her understanding and acceptance.     Dental advisory given  Plan Discussed with: CRNA and Anesthesiologist  Anesthesia Plan Comments:         Anesthesia Quick Evaluation

## 2020-04-10 NOTE — H&P (Signed)
Audrey [name] is an 74 y.o. female.   Chief Complaint: Right fourth and fifth metacarpal fractures displaced and angulated we will plan for surgical treatment HPI: Patient presents for evaluation and treatment of the of their upper extremity predicament. The patient denies neck, back, chest or  abdominal pain. The patient notes that they have no lower extremity problems. The patients primary complaint is noted. We are planning surgical care pathway for the upper extremity.  Past Medical History:  Diagnosis Date  . Allergy    seasonal allegies; seem worse this year  . Arthritis   . Cancer ([place])    basal cell cancer on face removed; Dr Ronnald Ramp  . Fracture    right 4th and 5th metacarpal  . GERD (gastroesophageal reflux disease)   . Hearing aid worn    B/L  . HOH (hard of hearing)   . Hyperlipidemia   . Hypothyroidism   . Osteopenia after menopause   . Pneumonia   . Wears glasses     Past Surgical History:  Procedure Laterality Date  . COLONOSCOPY  2012   neg; [facility]  . HEMORRHOID SURGERY      Family History  Problem Relation Age of Onset  . Stroke Maternal Grandmother   . Heart attack Mother        in 58s  . Uterine cancer Mother   . Heart attack Father        in 64s  . Heart attack Maternal Grandfather        in 72s  . Coronary artery disease Brother        stent in 36s  . Other Brother        ? leukemia  . Diabetes Neg Hx    Social History:  reports that she has never smoked. She has never used smokeless tobacco. She reports that she does not drink alcohol and does not use drugs.  Allergies:  Allergies  Allergen Reactions  . Celecoxib Swelling    face  . Sulfonamide Derivatives Swelling    face    Medications Prior to Admission  Medication Sig Dispense Refill  . atorvastatin (LIPITOR) 20 MG tablet Take 1 tablet by mouth daily. (Patient taking differently: Take 20 mg by mouth daily. ) 90 tablet 3  . Cholecalciferol (VITAMIN D) 50 MCG (2000 UT)  tablet Take 2,000 Units by mouth daily.     Marland Kitchen FLUoxetine (PROZAC) 40 MG capsule TAKE 1 CAPSULE BY MOUTH EVERY DAY (Patient taking differently: Take 40 mg by mouth daily. ) 90 capsule 3  . HYDROcodone-acetaminophen (NORCO) 10-325 MG tablet Take 1 tablet by mouth every 6 (six) hours as needed for severe pain (may cause constipation). 20 tablet 0  . levothyroxine (SYNTHROID, LEVOTHROID) 25 MCG tablet TAKE 1 TABLET BY MOUTH BEFORE BREAKFAST (Patient taking differently: Take 25 mcg by mouth daily before breakfast. ) 90 tablet 3  . Multiple Vitamins-Minerals (PRESERVISION AREDS) CAPS Take 1 capsule by mouth daily.    . naproxen sodium (ALEVE) 220 MG tablet Take 220 mg by mouth daily as needed (pain).    . nystatin-triamcinolone (MYCOLOG II) cream Apply 1 application topically daily as needed (Vaginal itcing).     Marland Kitchen omeprazole (PRILOSEC) 20 MG capsule Take 20 mg by mouth every other day.    . ondansetron (ZOFRAN ODT) 8 MG disintegrating tablet Take 1 tablet (8 mg total) by mouth every 8 (eight) hours as needed for nausea or vomiting. 10 tablet 1    Results for orders  placed or performed during the hospital encounter of 04/10/20 (from the past 48 hour(s))  CBC per protocol     Status: Abnormal   Collection Time: 04/10/20  6:28 AM  Result Value Ref Range   WBC 4.9 4.0 - 10.5 K/uL   RBC 3.71 (L) 3.87 - 5.11 MIL/uL   Hemoglobin 11.6 (L) 12.0 - 15.0 g/dL   HCT 36.1 36 - 46 %   MCV 97.3 80.0 - 100.0 fL   MCH 31.3 26.0 - 34.0 pg   MCHC 32.1 30.0 - 36.0 g/dL   RDW 13.4 11.5 - 15.5 %   Platelets 189 150 - 400 K/uL   nRBC 0.0 0.0 - 0.2 %    Comment: Performed at [facility], [place] [address] [place], [place] [address]   No results found.  Review of Systems  Respiratory: Negative.   Gastrointestinal: Negative.   Genitourinary: Negative.     Blood pressure (!) 110/50, pulse 64, temperature 98.1 F (36.7 C), temperature source Oral, resp. rate 18, height 5' 1.5" (1.562 m), weight 65.8 kg, SpO2  97 %. Physical Exam comminuted displaced fourth and fifth metacarpal fractures.  Patient is neurovascularly intact.  Will plan for open reduction internal fixation and repair is necessary.  She denies other complaints I discussed with her all risk and benefits.  The patient is alert and oriented in no acute distress. The patient complains of pain in the affected upper extremity.  The patient is noted to have a normal HEENT exam. Lung fields show equal chest expansion and no shortness of breath. Abdomen exam is nontender without distention. Lower extremity examination does not show any fracture dislocation or blood clot symptoms. Pelvis is stable and the neck and back are stable and nontender.  Assessment/Plan We will plan for surgical ORIF as necessary right fourth and fifth metacarpal fractures repair reconstruction is necessary.  We are planning surgery for your upper extremity. The risk and benefits of surgery to include risk of bleeding, infection, anesthesia,  damage to normal structures and failure of the surgery to accomplish its intended goals of relieving symptoms and restoring function have been discussed in detail. With this in mind we plan to proceed. I have specifically discussed with the patient the pre-and postoperative regime and the dos and don'ts and risk and benefits in great detail. Risk and benefits of surgery also include risk of dystrophy(CRPS), chronic nerve pain, failure of the healing process to go onto completion and other inherent risks of surgery The relavent the pathophysiology of the disease/injury process, as well as the alternatives for treatment and postoperative course of action has been discussed in great detail with the patient who desires to proceed.  We will do everything in our power to help you (the patient) restore function to the upper extremity. It is a pleasure to see this patient today.   Willa Frater III, MD 04/10/2020, 7:23 AM

## 2020-04-10 NOTE — Anesthesia Postprocedure Evaluation (Signed)
Anesthesia Post Note  Patient: Audrey [name]  Procedure(s) Performed: OPEN REDUCTION INTERNAL FIXATION (ORIF)  AS NECESSARY RIGHT FOURTH AND FIFTH METACARPAL FRACTURES (Right Finger)     Patient location during evaluation: PACU Anesthesia Type: Regional Level of consciousness: awake and alert Pain management: pain level controlled Vital Signs Assessment: post-procedure vital signs reviewed and stable Respiratory status: spontaneous breathing, nonlabored ventilation, respiratory function stable and patient connected to nasal cannula oxygen Cardiovascular status: stable and blood pressure returned to baseline Postop Assessment: no apparent nausea or vomiting Anesthetic complications: no   No complications documented.  Last Vitals:  Vitals:   04/10/20 0915 04/10/20 0922  BP: 96/78 (!) 104/59  Pulse: 64 65  Resp: (!) 22 19  Temp:  36.4 C  SpO2: 93% 96%    Last Pain:  Vitals:   04/10/20 0922  TempSrc:   PainSc: 0-No pain                 Channah Godeaux COKER

## 2020-04-13 ENCOUNTER — Encounter (HOSPITAL_COMMUNITY): Payer: Self-pay | Admitting: Orthopedic Surgery

## 2020-04-22 DIAGNOSIS — R600 Localized edema: Secondary | ICD-10-CM | POA: Diagnosis not present

## 2020-04-22 DIAGNOSIS — Z9181 History of falling: Secondary | ICD-10-CM | POA: Diagnosis not present

## 2020-04-22 DIAGNOSIS — S6291XD Unspecified fracture of right wrist and hand, subsequent encounter for fracture with routine healing: Secondary | ICD-10-CM | POA: Diagnosis not present

## 2020-04-23 DIAGNOSIS — M79641 Pain in right hand: Secondary | ICD-10-CM | POA: Diagnosis not present

## 2020-04-23 DIAGNOSIS — Z4789 Encounter for other orthopedic aftercare: Secondary | ICD-10-CM | POA: Diagnosis not present

## 2020-04-23 DIAGNOSIS — S62324D Displaced fracture of shaft of fourth metacarpal bone, right hand, subsequent encounter for fracture with routine healing: Secondary | ICD-10-CM | POA: Diagnosis not present

## 2020-05-05 DIAGNOSIS — M79641 Pain in right hand: Secondary | ICD-10-CM | POA: Diagnosis not present

## 2020-05-07 DIAGNOSIS — S62324D Displaced fracture of shaft of fourth metacarpal bone, right hand, subsequent encounter for fracture with routine healing: Secondary | ICD-10-CM | POA: Diagnosis not present

## 2020-05-07 DIAGNOSIS — Z4789 Encounter for other orthopedic aftercare: Secondary | ICD-10-CM | POA: Diagnosis not present

## 2020-05-07 DIAGNOSIS — M79641 Pain in right hand: Secondary | ICD-10-CM | POA: Diagnosis not present

## 2020-05-20 DIAGNOSIS — M79641 Pain in right hand: Secondary | ICD-10-CM | POA: Diagnosis not present

## 2020-06-03 DIAGNOSIS — Z4789 Encounter for other orthopedic aftercare: Secondary | ICD-10-CM | POA: Diagnosis not present

## 2020-06-03 DIAGNOSIS — S62324D Displaced fracture of shaft of fourth metacarpal bone, right hand, subsequent encounter for fracture with routine healing: Secondary | ICD-10-CM | POA: Diagnosis not present

## 2020-08-19 DIAGNOSIS — H35373 Puckering of macula, bilateral: Secondary | ICD-10-CM | POA: Diagnosis not present

## 2020-08-19 DIAGNOSIS — H353132 Nonexudative age-related macular degeneration, bilateral, intermediate dry stage: Secondary | ICD-10-CM | POA: Diagnosis not present

## 2020-09-15 DIAGNOSIS — L292 Pruritus vulvae: Secondary | ICD-10-CM | POA: Diagnosis not present

## 2020-09-15 DIAGNOSIS — Z01419 Encounter for gynecological examination (general) (routine) without abnormal findings: Secondary | ICD-10-CM | POA: Diagnosis not present

## 2020-09-15 DIAGNOSIS — N814 Uterovaginal prolapse, unspecified: Secondary | ICD-10-CM | POA: Diagnosis not present

## 2020-11-25 ENCOUNTER — Ambulatory Visit
Admission: RE | Admit: 2020-11-25 | Discharge: 2020-11-25 | Disposition: A | Discharge status: Home / Self Care | Payer: Medicare PPO | Source: Ambulatory Visit | Attending: Family Medicine | Admitting: Family Medicine

## 2020-11-25 ENCOUNTER — Other Ambulatory Visit: Payer: Self-pay | Admitting: Family Medicine

## 2020-11-25 ENCOUNTER — Other Ambulatory Visit: Payer: Self-pay

## 2020-11-25 DIAGNOSIS — R52 Pain, unspecified: Secondary | ICD-10-CM

## 2020-11-25 DIAGNOSIS — M545 Low back pain, unspecified: Secondary | ICD-10-CM | POA: Diagnosis not present

## 2020-12-10 DIAGNOSIS — R2689 Other abnormalities of gait and mobility: Secondary | ICD-10-CM | POA: Diagnosis not present

## 2020-12-10 DIAGNOSIS — K219 Gastro-esophageal reflux disease without esophagitis: Secondary | ICD-10-CM | POA: Diagnosis not present

## 2020-12-10 DIAGNOSIS — H3530 Unspecified macular degeneration: Secondary | ICD-10-CM | POA: Diagnosis not present

## 2020-12-24 ENCOUNTER — Other Ambulatory Visit: Payer: Self-pay

## 2020-12-24 ENCOUNTER — Encounter: Payer: Self-pay | Admitting: Physical Therapy

## 2020-12-24 ENCOUNTER — Ambulatory Visit: Payer: Medicare PPO | Attending: Family Medicine | Admitting: Physical Therapy

## 2020-12-24 DIAGNOSIS — M6281 Muscle weakness (generalized): Secondary | ICD-10-CM | POA: Diagnosis not present

## 2020-12-24 DIAGNOSIS — R2681 Unsteadiness on feet: Secondary | ICD-10-CM | POA: Diagnosis not present

## 2020-12-24 DIAGNOSIS — R2689 Other abnormalities of gait and mobility: Secondary | ICD-10-CM | POA: Diagnosis not present

## 2020-12-25 NOTE — Therapy (Signed)
[facility] [address] [place], [place], [address] Phone: [phone]   Fax:  [phone]  Physical Therapy Evaluation  Patient Details  Name: Audrey [name] MRN: [id number] Date of Birth: 10/21/1945 Referring Provider (PT): Carol Ada, MD   Encounter Date: 12/24/2020   PT End of Session - 12/25/20 0827    Visit Number 1    Number of Visits 17    Date for PT Re-Evaluation 22/97/98   90 day cert for 8 wk POC   Authorization Type [employer]-submitted upon completion of eval    PT Start Time 1318    PT Stop Time 1401    PT Time Calculation (min) 43 min    Activity Tolerance Patient tolerated treatment well    Behavior During Therapy [facility] for tasks assessed/performed           Past Medical History:  Diagnosis Date  . Allergy    seasonal allegies; seem worse this year  . Arthritis   . Cancer ([place])    basal cell cancer on face removed; Dr Ronnald Ramp  . Fracture    right 4th and 5th metacarpal  . GERD (gastroesophageal reflux disease)   . Hearing aid worn    B/L  . HOH (hard of hearing)   . Hyperlipidemia   . Hypothyroidism   . Osteopenia after menopause   . Pneumonia   . Wears glasses     Past Surgical History:  Procedure Laterality Date  . COLONOSCOPY  2012   neg; [facility]  . HEMORRHOID SURGERY    . OPEN REDUCTION INTERNAL FIXATION (ORIF) METACARPAL Right 04/10/2020   Procedure: OPEN REDUCTION INTERNAL FIXATION (ORIF)  AS NECESSARY RIGHT FOURTH AND FIFTH METACARPAL FRACTURES;  Surgeon: [name], MD;  Location: [facility];  Service: Orthopedics;  Laterality: Right;  OPEN REDUCTION INTERNAL FIXATION (ORIF)  AS NECESSARY RIGHT FOURTH AND FIFTH METACARPAL FRACTURES    There were no vitals filed for this visit.    Subjective Assessment - 12/24/20 1325    Subjective If I'm going from a room with light to a dark area of outdoor area, I am very afraid of my balance.  No trouble with balance in the  home, but sometimes have to use my husband's help for curbs.  Balance issues have been going on for a while now.  No falls in the past 6 months, but last fall was in August 2021, where fell and broke 5 bones in hand and ribs.  Was half-way up the circular stariway, turned around and then fell.  Does not use assistive device.    Pertinent History PMH of vertigo, GERD, macular degeneration, significant degenerative changes and anteriorlistesis    Patient Stated Goals To improve balance    Currently in Pain? No/denies   Has significant back pain in the mornings, up to 9/10; gets better throught the day.             [facility] PT Assessment - 12/24/20 1334      Assessment   Medical Diagnosis other abnormalities of gait and mobility    Referring Provider (PT) Carol Ada, MD    Onset Date/Surgical Date 12/10/20   MD visit     Precautions   Precautions Fall      Balance Screen   Has the patient fallen in the past 6 months No   Did have one last August   Has the patient had a decrease in activity level because of a fear of  falling?  Yes   gives more thought/awareness to activities   Is the patient reluctant to leave their home because of a fear of falling?  Yes      [place] Private residence    Living Arrangements Spouse/significant other    Available Help at Discharge Family    Type of Guys to enter    Entrance Stairs-Number of Steps 1    Entrance Stairs-Rails --   Holds to [employer] Two level;Able to live on main level with bedroom/bathroom    Home Equipment None      Prior Function   Level of Independence Independent    Vocation Retired    Leisure Enjoys activities with grandchildren, going to their ballgames and activities, keeps younger grandchildren once per week; goes to grocery store      Observation/Other Assessments   Focus on Therapeutic Outcomes (FOTO)  NA      ROM / Strength   AROM / PROM / Strength  Strength      Strength   Overall Strength Deficits    Overall Strength Comments grossly tested at least 4+/5 BLEs, with L and R ankle dorsiflexion 4/5      Transfers   Transfers Sit to Stand;Stand to Sit    Sit to Stand 6: Modified independent (Device/Increase time)    Five time sit to stand comments  16.75    Stand to Sit 6: Modified independent (Device/Increase time);To chair/3-in-1      Ambulation/Gait   Ambulation/Gait Yes    Ambulation/Gait Assistance 6: Modified independent (Device/Increase time)    Ambulation Distance (Feet) 200 Feet    Assistive device None    Gait Pattern Step-through pattern;Wide base of support    Ambulation Surface Level;Indoor    Gait velocity 10.78 sec = 3.04 ft/sec      High Level Balance   High Level Balance Comments Standing EO and EC on solid surface 30 seconds; standing EO on compliant surface x 30 seconds; approx 7 seconds EC on foam with increased sway prior to LOB and opening eyes.  Indicative of decreased vestibular system function for balance.      Functional Gait  Assessment   Gait assessed  Yes    Gait Level Surface Walks 20 ft, slow speed, abnormal gait pattern, evidence for imbalance or deviates 10-15 in outside of the 12 in walkway width. Requires more than 7 sec to ambulate 20 ft.   7.6   Change in Gait Speed Able to change speed, demonstrates mild gait deviations, deviates 6-10 in outside of the 12 in walkway width, or no gait deviations, unable to achieve a major change in velocity, or uses a change in velocity, or uses an assistive device.    Gait with Horizontal Head Turns Performs head turns with moderate changes in gait velocity, slows down, deviates 10-15 in outside 12 in walkway width but recovers, can continue to walk.   8.25   Gait with Vertical Head Turns Performs task with moderate change in gait velocity, slows down, deviates 10-15 in outside 12 in walkway width but recovers, can continue to walk.   9.03   Gait and Pivot Turn  Pivot turns safely within 3 sec and stops quickly with no loss of balance.    Step Over Obstacle Is able to step over one shoe box (4.5 in total height) but must slow down and adjust steps to clear box safely. May  require verbal cueing.    Gait with Narrow Base of Support Ambulates less than 4 steps heel to toe or cannot perform without assistance.    Gait with Eyes Closed Cannot walk 20 ft without assistance, severe gait deviations or imbalance, deviates greater than 15 in outside 12 in walkway width or will not attempt task.    Ambulating Backwards Walks 20 ft, uses assistive device, slower speed, mild gait deviations, deviates 6-10 in outside 12 in walkway width.    Steps Alternating feet, must use rail.    Total Score 13    FGA comment: Scores <22/30 indicate increased fall risk.                      Objective measurements completed on examination: See above findings.               PT Education - 12/25/20 0826    Education Details PT eval results, POC    Person(s) Educated Patient    Methods Explanation    Comprehension Verbalized understanding            PT Short Term Goals - 12/25/20 0943      PT SHORT TERM GOAL #1   Title Pt will be independent with HEP for improved strength, balance, transfers, and gait.  TARGET 01/22/2021    Time 4    Period Weeks    Status New      PT SHORT TERM GOAL #2   Title Pt will improve 5x sit<>stand to less than or equal to 13 sec to demonstrate improved functional strength and transfer efficiency.    Baseline 16.75 sec    Time 4    Period Weeks    Status New      PT SHORT TERM GOAL #3   Title Pt will improve FGA score to at least 18/30 to decrease fall risk.    Baseline 13/30    Time 4    Status New      PT SHORT TERM GOAL #4   Title Pt will be able to stand with Eyes closed on compliant surface for at least 15 seconds to demo improved vestibular system function for balance.    Baseline 7 sec    Time 4     Period Weeks    Status New      PT SHORT TERM GOAL #5   Title Pt will verbalize understanding of fall prevention in home environment.    Time 4    Period Weeks    Status New             PT Long Term Goals - 12/25/20 0947      PT LONG TERM GOAL #1   Title Pt will be independent with progression of HEP for improved strength, balance, transfers, and gait.  TARGET 02/19/2021    Time 8    Period Weeks    Status New      PT LONG TERM GOAL #2   Title Pt will improve FGA score to at least 22/30 to decrease fall risk.    Time 8    Period Weeks    Status New      PT LONG TERM GOAL #3   Title Pt will stand eyes closed on compliant surface x 30 seconds to demo improved vestibular system use for balance.    Time 8    Period Weeks    Status New      PT LONG TERM GOAL #  4   Title Pt will ambulate at least 1000 ft indoor and outdoor surfaces, using appropriate assistive device, mod I and no LOB, for improved community gait.    Time 8    Period Weeks    Status New                  Plan - 12/25/20 [id number]    Clinical Impression Statement Pt is a 75 year old female who presents to [facility] with history of balance impairment, with fall in August 2021 resulting in multiple fractures.  Pt presents with decreased balance, abnormal gait, decreased strength, decreased vestibular system use for balance.  Pt is at risk for falls per FGA score, and demonstrates decreased vestibular system use for balance with pt unable to maintain balance with EC on compliant surface.  She is active with family and grandchildren and would beneift from skilled PT to address the above stated deficits for decreased fall risk and improved overall functional mobility.    Personal Factors and Comorbidities Comorbidity 3+    Comorbidities high cholesterol, thyroid, anxiety, fractures R hand and ribs    Examination-Activity Limitations Locomotion Level;Transfers;Stand;Caring for Others    Examination-Participation  Restrictions Community Activity    Stability/Clinical Decision Making Evolving/Moderate complexity    Clinical Decision Making Moderate    Rehab Potential Good    PT Frequency 2x / week    PT Duration 8 weeks   plus eval   PT Treatment/Interventions ADLs/Self Care Home Management;Gait training;Stair training;Functional mobility training;Therapeutic activities;Therapeutic exercise;Balance training;DME Instruction;Neuromuscular re-education;Patient/family education;Vestibular    PT Next Visit Plan Initiate HEP for balance/vestibular, muscle strength; consider SOT/Balance Master; consider trial of cane    Consulted and Agree with Plan of Care Patient           Patient will benefit from skilled therapeutic intervention in order to improve the following deficits and impairments:  Abnormal gait,Difficulty walking,Decreased balance,Decreased mobility,Decreased strength  Visit Diagnosis: Other abnormalities of gait and mobility  Unsteadiness on feet  Muscle weakness (generalized)     Problem List Patient Active Problem List   Diagnosis Date Noted  . Acute left-sided low back pain without sciatica 08/06/2018  . Anxiety 07/11/2017  . Hypothyroidism 02/07/2015  . Arthralgia of multiple joints 05/28/2014  . Vitamin D deficiency 05/09/2012  . Osteopenia 05/26/2010  . Hypersomnia 02/22/2010  . Hyperlipidemia 01/21/2010  . Diverticulosis of large intestine 01/21/2010  . 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 SKIN CANCER, HX OF 01/21/2010    Frazier Butt. 12/25/2020, 9:50 AM Frazier Butt., PT  [facility] [address] [place], [place], [address] Phone: [phone]   Fax:  [phone]  Name: Audrey [name] MRN: [id number] Date of Birth: [birth year]

## 2020-12-29 DIAGNOSIS — M4310 Spondylolisthesis, site unspecified: Secondary | ICD-10-CM | POA: Diagnosis not present

## 2020-12-29 DIAGNOSIS — M5136 Other intervertebral disc degeneration, lumbar region: Secondary | ICD-10-CM | POA: Diagnosis not present

## 2020-12-29 DIAGNOSIS — M4180 Other forms of scoliosis, site unspecified: Secondary | ICD-10-CM | POA: Diagnosis not present

## 2020-12-30 ENCOUNTER — Encounter: Payer: Self-pay | Admitting: Physical Therapy

## 2020-12-30 ENCOUNTER — Ambulatory Visit: Payer: Medicare PPO | Admitting: Physical Therapy

## 2020-12-30 ENCOUNTER — Other Ambulatory Visit: Payer: Self-pay

## 2020-12-30 DIAGNOSIS — R2681 Unsteadiness on feet: Secondary | ICD-10-CM

## 2020-12-30 DIAGNOSIS — R2689 Other abnormalities of gait and mobility: Secondary | ICD-10-CM | POA: Diagnosis not present

## 2020-12-30 DIAGNOSIS — M6281 Muscle weakness (generalized): Secondary | ICD-10-CM | POA: Diagnosis not present

## 2020-12-30 NOTE — Patient Instructions (Signed)
Access Code: [email or web address] URL: [email or web address] Date: 12/30/2020 Prepared by: Mady Haagensen  Exercises Single Leg Stance with Support - 1 x daily - 5 x weekly - 1 sets - 3 reps - 10 sec hold Standing Tandem Balance with Counter Support - 1 x daily - 5 x weekly - 1 sets - 3 reps - 15 sec hold Romberg Stance Eyes Closed on Foam Pad - 1 x daily - 5 x weekly - 1 sets - 3 reps - 10 sec hold Romberg Stance on Foam Pad with Head Rotation - 1 x daily - 5 x weekly - 1 sets - 5-10 reps Romberg Stance with Head Nods on Foam Pad - 1 x daily - 5 x weekly - 1 sets - 5-10 reps

## 2020-12-31 NOTE — Therapy (Signed)
[facility] [address] [place], [place], [address] Phone: [phone]   Fax:  [phone]  Physical Therapy Treatment  Patient Details  Name: Audrey [name] MRN: [id number] Date of Birth: [birth year] Referring Provider (PT): Carol Ada, MD   Encounter Date: 12/30/2020   PT End of Session - 12/30/20 1321    Visit Number 2    Number of Visits 17    Date for PT Re-Evaluation 82/99/37   90 day cert for 8 wk POC   Authorization Type [employer]-submitted upon completion of eval    PT Start Time 1318    PT Stop Time 1400    PT Time Calculation (min) 42 min    Equipment Utilized During Treatment Gait belt    Activity Tolerance Patient tolerated treatment well    Behavior During Therapy WFL for tasks assessed/performed           Past Medical History:  Diagnosis Date  . Allergy    seasonal allegies; seem worse this year  . Arthritis   . Cancer ([place])    basal cell cancer on face removed; Dr Ronnald Ramp  . Fracture    right 4th and 5th metacarpal  . GERD (gastroesophageal reflux disease)   . Hearing aid worn    B/L  . HOH (hard of hearing)   . Hyperlipidemia   . Hypothyroidism   . Osteopenia after menopause   . Pneumonia   . Wears glasses     Past Surgical History:  Procedure Laterality Date  . COLONOSCOPY  2012   neg; [facility]  . HEMORRHOID SURGERY    . OPEN REDUCTION INTERNAL FIXATION (ORIF) METACARPAL Right 04/10/2020   Procedure: OPEN REDUCTION INTERNAL FIXATION (ORIF)  AS NECESSARY RIGHT FOURTH AND FIFTH METACARPAL FRACTURES;  Surgeon: [name], MD;  Location: [facility];  Service: Orthopedics;  Laterality: Right;  OPEN REDUCTION INTERNAL FIXATION (ORIF)  AS NECESSARY RIGHT FOURTH AND FIFTH METACARPAL FRACTURES    There were no vitals filed for this visit.   Subjective Assessment - 12/30/20 1311    Subjective Have a new order from the PA, that DDD is causing back pain, and brought in order for  PT for low back.    Pertinent History PMH of vertigo, GERD, macular degeneration, significant degenerative changes and anteriorlistesis    Patient Stated Goals To improve balance    Currently in Pain? No/denies   varies through the day, worse in the mornings, standing long periods                                 Balance Exercises - 12/30/20 1320      Balance Exercises: Standing   Standing Eyes Opened Narrow base of support (BOS);Wide (BOA);Foam/compliant surface;5 reps;Limitations    Standing Eyes Opened Limitations Head turns, head nods x 5 reps each position, intermittent UE support    Standing Eyes Closed Narrow base of support (BOS);Wide (BOA);Foam/compliant surface;Limitations    Standing Eyes Closed Limitations Wide BOS EC head steady x 10 sec, then progress to head turns/head nods x 5, with one episode of significant LOB to R side (pt states she doesn't know she is losing her balance), neeing PT assist to regain balance at midline.  No anticipatory/reactive balance strategy noted.    Tandem Stance Eyes open;Upper extremity support 1;2 reps;15 secs    SLS Eyes open;Upper extremity support 2;2 reps;10 secs    Rockerboard  Anterior/posterior;Lateral;EO;Limitations    Rockerboard Limitations Hip/ankle strategy work with UE support, then maintaining balance in midline with alt UE lifts for decreasing UE support.    Retro Gait 3 reps   Forward/back at [employer] 2 reps   along counter R and L   Heel Raises Both;10 reps    Toe Raise Both;10 reps    Other Standing Exercises R SLS < 1 sec, L SLS approx 5 sec; tandem stance either position approx 3 sec             PT Education - 12/31/20 0805    Education Details Initiated HEP-see instructions    Person(s) Educated Patient    Methods Explanation;Demonstration;Handout;Verbal cues    Comprehension Verbalized understanding;Returned demonstration;Verbal cues required            PT Short Term Goals -  12/25/20 0943      PT SHORT TERM GOAL #1   Title Pt will be independent with HEP for improved strength, balance, transfers, and gait.  TARGET 01/22/2021    Time 4    Period Weeks    Status New      PT SHORT TERM GOAL #2   Title Pt will improve 5x sit<>stand to less than or equal to 13 sec to demonstrate improved functional strength and transfer efficiency.    Baseline 16.75 sec    Time 4    Period Weeks    Status New      PT SHORT TERM GOAL #3   Title Pt will improve FGA score to at least 18/30 to decrease fall risk.    Baseline 13/30    Time 4    Status New      PT SHORT TERM GOAL #4   Title Pt will be able to stand with Eyes closed on compliant surface for at least 15 seconds to demo improved vestibular system function for balance.    Baseline 7 sec    Time 4    Period Weeks    Status New      PT SHORT TERM GOAL #5   Title Pt will verbalize understanding of fall prevention in home environment.    Time 4    Period Weeks    Status New             PT Long Term Goals - 12/25/20 0947      PT LONG TERM GOAL #1   Title Pt will be independent with progression of HEP for improved strength, balance, transfers, and gait.  TARGET 02/19/2021    Time 8    Period Weeks    Status New      PT LONG TERM GOAL #2   Title Pt will improve FGA score to at least 22/30 to decrease fall risk.    Time 8    Period Weeks    Status New      PT LONG TERM GOAL #3   Title Pt will stand eyes closed on compliant surface x 30 seconds to demo improved vestibular system use for balance.    Time 8    Period Weeks    Status New      PT LONG TERM GOAL #4   Title Pt will ambulate at least 1000 ft indoor and outdoor surfaces, using appropriate assistive device, mod I and no LOB, for improved community gait.    Time 8    Period Weeks    Status New  Plan - 12/31/20 0805    Clinical Impression Statement Initiated HEP for balance this visit.  Worked on compliant surface  with vision removed and head motions, which resulted in significant LOB, where pt didn't realize she was losing balance until therapist assisted.  No reactive balance strategy noted, so addressed with initiation of hip/ankle strategy work.  She will continue to benefit from skilled PT to further address balance and gait for improved mobility and decreased falls.    Personal Factors and Comorbidities Comorbidity 3+    Comorbidities high cholesterol, thyroid, anxiety, fractures R hand and ribs    Examination-Activity Limitations Locomotion Level;Transfers;Stand;Caring for Others    Examination-Participation Restrictions Community Activity    Stability/Clinical Decision Making Evolving/Moderate complexity    Rehab Potential Good    PT Frequency 2x / week    PT Duration 8 weeks   plus eval   PT Treatment/Interventions ADLs/Self Care Home Management;Gait training;Stair training;Functional mobility training;Therapeutic activities;Therapeutic exercise;Balance training;DME Instruction;Neuromuscular re-education;Patient/family education;Vestibular    PT Next Visit Plan Review HEP for balance/vestibular, muscle strength; consider SOT/Balance Master; consider trial of cane    Consulted and Agree with Plan of Care Patient           Patient will benefit from skilled therapeutic intervention in order to improve the following deficits and impairments:  Abnormal gait,Difficulty walking,Decreased balance,Decreased mobility,Decreased strength  Visit Diagnosis: Unsteadiness on feet  Other abnormalities of gait and mobility     Problem List Patient Active Problem List   Diagnosis Date Noted  . Acute left-sided low back pain without sciatica 08/06/2018  . Anxiety 07/11/2017  . Hypothyroidism 02/07/2015  . Arthralgia of multiple joints 05/28/2014  . Vitamin D deficiency 05/09/2012  . Osteopenia 05/26/2010  . Hypersomnia 02/22/2010  . Hyperlipidemia 01/21/2010  . Diverticulosis of large intestine  01/21/2010  . 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 SKIN CANCER, HX OF 01/21/2010    Frazier Butt. 12/31/2020, 8:08 AM  Frazier Butt., PT   [facility] [address] [place], [place], [address] Phone: [phone]   Fax:  [phone]  Name: Audrey [name] MRN: [id number] Date of Birth: 1946/04/03

## 2021-01-01 ENCOUNTER — Encounter: Payer: Self-pay | Admitting: Physical Therapy

## 2021-01-01 ENCOUNTER — Other Ambulatory Visit: Payer: Self-pay

## 2021-01-01 ENCOUNTER — Ambulatory Visit: Payer: Medicare PPO | Admitting: Physical Therapy

## 2021-01-01 DIAGNOSIS — M6281 Muscle weakness (generalized): Secondary | ICD-10-CM | POA: Diagnosis not present

## 2021-01-01 DIAGNOSIS — R2689 Other abnormalities of gait and mobility: Secondary | ICD-10-CM

## 2021-01-01 DIAGNOSIS — R2681 Unsteadiness on feet: Secondary | ICD-10-CM | POA: Diagnosis not present

## 2021-01-02 NOTE — Therapy (Signed)
[facility] [address] [place], [place], [address] Phone: [phone]   Fax:  [phone]  Physical Therapy Treatment  Patient Details  Name: Audrey [name] MRN: [id number] Date of Birth: 27-Mar-1946 Referring Provider (PT): Carol Ada, MD   Encounter Date: 01/01/2021   PT End of Session - 01/01/21 1236    Visit Number 3    Number of Visits 17    Date for PT Re-Evaluation 64/33/29   90 day cert for 8 wk POC   Authorization Type [employer]-submitted upon completion of eval    Authorization Time Period 16 visits from 4/21- 03/23/21    Authorization - Visit Number 2    Authorization - Number of Visits 16    Progress Note Due on Visit 10    PT Start Time 1233    PT Stop Time 1315    PT Time Calculation (min) 42 min    Equipment Utilized During Treatment Gait belt    Activity Tolerance Patient tolerated treatment well    Behavior During Therapy WFL for tasks assessed/performed           Past Medical History:  Diagnosis Date  . Allergy    seasonal allegies; seem worse this year  . Arthritis   . Cancer ([place])    basal cell cancer on face removed; Dr Ronnald Ramp  . Fracture    right 4th and 5th metacarpal  . GERD (gastroesophageal reflux disease)   . Hearing aid worn    B/L  . HOH (hard of hearing)   . Hyperlipidemia   . Hypothyroidism   . Osteopenia after menopause   . Pneumonia   . Wears glasses     Past Surgical History:  Procedure Laterality Date  . COLONOSCOPY  2012   neg; [facility]  . HEMORRHOID SURGERY    . OPEN REDUCTION INTERNAL FIXATION (ORIF) METACARPAL Right 04/10/2020   Procedure: OPEN REDUCTION INTERNAL FIXATION (ORIF)  AS NECESSARY RIGHT FOURTH AND FIFTH METACARPAL FRACTURES;  Surgeon: [name], MD;  Location: [facility];  Service: Orthopedics;  Laterality: Right;  OPEN REDUCTION INTERNAL FIXATION (ORIF)  AS NECESSARY RIGHT FOURTH AND FIFTH METACARPAL FRACTURES    There were no  vitals filed for this visit.   Subjective Assessment - 01/01/21 1235    Subjective No new complaints. No falls or pain to report. HEP is going well, still challenging.    Pertinent History PMH of vertigo, GERD, macular degeneration, significant degenerative changes and anteriorlistesis    Patient Stated Goals To improve balance    Currently in Pain? No/denies                [facility] Adult PT Treatment/Exercise - 01/01/21 1237      Transfers   Transfers Sit to Stand;Stand to Sit    Sit to Stand 6: Modified independent (Device/Increase time)    Stand to Sit 6: Modified independent (Device/Increase time);To chair/3-in-1      Ambulation/Gait   Ambulation/Gait Yes    Ambulation/Gait Assistance 6: Modified independent (Device/Increase time)    Ambulation Distance (Feet) --   around clinic with session   Assistive device None    Gait Pattern Step-through pattern;Wide base of support    Ambulation Surface Unlevel;Indoor      High Level Balance   High Level Balance Activities Side stepping;Tandem walking;Marching forwards;Marching backwards   tandem/toe/heel walking forward/backward   High Level Balance Comments on red/blue mats next to counter top: 3 laps each/each way with cues  on posture, weight shifting and technique. Min guard to min assist for balance with no to single UE support on counter.      Knee/Hip Exercises: Aerobic   Nustep level 3.0 with UE/LE's for 6 minutes with goal >/= 50 steps per minute for strengthening and activity tolerance.               Balance Exercises - 01/01/21 1300      Balance Exercises: Standing   Rockerboard Anterior/posterior;Lateral;EO;EC;30 seconds;Limitations;Other reps (comment)    Rockerboard Limitations performed both ways on balance board with no UE support- rocking the board with emphasis on tall posture with EO progressing to EC; then holding the board steady for 30 sec's x 3 reps. Min to mod assist for balance with cues on posture and  weight shifting. Pt with multiple posterior balance losses with vision removed.    Sit to Stand Standard surface;Upper extremity support;Foam/compliant surface;Limitations    Sit to Stand Limitations seated with feet across red foam beam with intermittent UE support for sit<>stands x 10 reps. cues for full standing posture and controlled descent. repeated cues needed to scoot closer to edge of mat and for increased anterior weight shifting to decrease retropulsion on mat table. min to mod assist.               PT Short Term Goals - 12/25/20 0943      PT SHORT TERM GOAL #1   Title Pt will be independent with HEP for improved strength, balance, transfers, and gait.  TARGET 01/22/2021    Time 4    Period Weeks    Status New      PT SHORT TERM GOAL #2   Title Pt will improve 5x sit<>stand to less than or equal to 13 sec to demonstrate improved functional strength and transfer efficiency.    Baseline 16.75 sec    Time 4    Period Weeks    Status New      PT SHORT TERM GOAL #3   Title Pt will improve FGA score to at least 18/30 to decrease fall risk.    Baseline 13/30    Time 4    Status New      PT SHORT TERM GOAL #4   Title Pt will be able to stand with Eyes closed on compliant surface for at least 15 seconds to demo improved vestibular system function for balance.    Baseline 7 sec    Time 4    Period Weeks    Status New      PT SHORT TERM GOAL #5   Title Pt will verbalize understanding of fall prevention in home environment.    Time 4    Period Weeks    Status New             PT Long Term Goals - 12/25/20 0947      PT LONG TERM GOAL #1   Title Pt will be independent with progression of HEP for improved strength, balance, transfers, and gait.  TARGET 02/19/2021    Time 8    Period Weeks    Status New      PT LONG TERM GOAL #2   Title Pt will improve FGA score to at least 22/30 to decrease fall risk.    Time 8    Period Weeks    Status New      PT LONG TERM  GOAL #3   Title Pt will stand eyes closed on compliant  surface x 30 seconds to demo improved vestibular system use for balance.    Time 8    Period Weeks    Status New      PT LONG TERM GOAL #4   Title Pt will ambulate at least 1000 ft indoor and outdoor surfaces, using appropriate assistive device, mod I and no LOB, for improved community gait.    Time 8    Period Weeks    Status New                 Plan - 01/01/21 1236    Clinical Impression Statement Today's skilled session continued to focus on strengthening, activity tolerance and balance training with no isssues reported or noted in session. Pt continues to demonstrated decreased balance reacitons with vision removed needing increased assistance for balance. The pt is progressing toward goals and should benefit from continued PT to progress toward unmet goals.    Personal Factors and Comorbidities Comorbidity 3+    Comorbidities high cholesterol, thyroid, anxiety, fractures R hand and ribs    Examination-Activity Limitations Locomotion Level;Transfers;Stand;Caring for Others    Examination-Participation Restrictions Community Activity    Stability/Clinical Decision Making Evolving/Moderate complexity    Rehab Potential Good    PT Frequency 2x / week    PT Duration 8 weeks   plus eval   PT Treatment/Interventions ADLs/Self Care Home Management;Gait training;Stair training;Functional mobility training;Therapeutic activities;Therapeutic exercise;Balance training;DME Instruction;Neuromuscular re-education;Patient/family education;Vestibular    PT Next Visit Plan muscle strength; consider SOT/Balance Master; ? use of  cane with gait. PT to eval pt for back pain (new MD order recieved, PT aware)    Consulted and Agree with Plan of Care Patient           Patient will benefit from skilled therapeutic intervention in order to improve the following deficits and impairments:  Abnormal gait,Difficulty walking,Decreased  balance,Decreased mobility,Decreased strength  Visit Diagnosis: Unsteadiness on feet  Other abnormalities of gait and mobility  Muscle weakness (generalized)     Problem List Patient Active Problem List   Diagnosis Date Noted  . Acute left-sided low back pain without sciatica 08/06/2018  . Anxiety 07/11/2017  . Hypothyroidism 02/07/2015  . Arthralgia of multiple joints 05/28/2014  . Vitamin D deficiency 05/09/2012  . Osteopenia 05/26/2010  . Hypersomnia 02/22/2010  . Hyperlipidemia 01/21/2010  . Diverticulosis of large intestine 01/21/2010  . 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 SKIN CANCER, HX OF 01/21/2010    Willow Ora, PTA, [facility] [address], [place], [place] [address] [phone] 01/02/21, 5:56 PM   Name: SHANEY DECKMAN MRN: [id number] Date of Birth: 07-Oct-1945

## 2021-01-05 ENCOUNTER — Ambulatory Visit: Payer: Medicare PPO | Attending: Family Medicine | Admitting: Physical Therapy

## 2021-01-05 ENCOUNTER — Encounter: Payer: Self-pay | Admitting: Physical Therapy

## 2021-01-05 ENCOUNTER — Other Ambulatory Visit: Payer: Self-pay

## 2021-01-05 DIAGNOSIS — M545 Low back pain, unspecified: Secondary | ICD-10-CM | POA: Diagnosis not present

## 2021-01-05 DIAGNOSIS — M6281 Muscle weakness (generalized): Secondary | ICD-10-CM | POA: Insufficient documentation

## 2021-01-05 DIAGNOSIS — R2689 Other abnormalities of gait and mobility: Secondary | ICD-10-CM | POA: Insufficient documentation

## 2021-01-05 DIAGNOSIS — R293 Abnormal posture: Secondary | ICD-10-CM | POA: Diagnosis not present

## 2021-01-05 DIAGNOSIS — R2681 Unsteadiness on feet: Secondary | ICD-10-CM | POA: Insufficient documentation

## 2021-01-05 DIAGNOSIS — G8929 Other chronic pain: Secondary | ICD-10-CM | POA: Insufficient documentation

## 2021-01-05 NOTE — Therapy (Signed)
[facility] [address] [place], [place], [address] Phone: [phone]   Fax:  [phone]  Physical Therapy Treatment  Patient Details  Name: Audrey [name] MRN: [id number] Date of Birth: 1946-04-07 Referring Provider (PT): Carol Ada, MD   Encounter Date: 01/05/2021   PT End of Session - 01/05/21 1318    Visit Number 4    Number of Visits 17    Date for PT Re-Evaluation 39/76/73   90 day cert for 8 wk POC   Authorization Type [employer]-submitted upon completion of eval    Authorization Time Period 16 visits from 4/21- 03/23/21    Authorization - Visit Number 3    Authorization - Number of Visits 16    Progress Note Due on Visit 10    PT Start Time 1319    PT Stop Time 1402    PT Time Calculation (min) 43 min    Equipment Utilized During Treatment Gait belt    Activity Tolerance Patient tolerated treatment well    Behavior During Therapy WFL for tasks assessed/performed           Past Medical History:  Diagnosis Date  . Allergy    seasonal allegies; seem worse this year  . Arthritis   . Cancer ([place])    basal cell cancer on face removed; Dr Ronnald Ramp  . Fracture    right 4th and 5th metacarpal  . GERD (gastroesophageal reflux disease)   . Hearing aid worn    B/L  . HOH (hard of hearing)   . Hyperlipidemia   . Hypothyroidism   . Osteopenia after menopause   . Pneumonia   . Wears glasses     Past Surgical History:  Procedure Laterality Date  . COLONOSCOPY  2012   neg; [facility]  . HEMORRHOID SURGERY    . OPEN REDUCTION INTERNAL FIXATION (ORIF) METACARPAL Right 04/10/2020   Procedure: OPEN REDUCTION INTERNAL FIXATION (ORIF)  AS NECESSARY RIGHT FOURTH AND FIFTH METACARPAL FRACTURES;  Surgeon: [name], MD;  Location: [facility];  Service: Orthopedics;  Laterality: Right;  OPEN REDUCTION INTERNAL FIXATION (ORIF)  AS NECESSARY RIGHT FOURTH AND FIFTH METACARPAL FRACTURES    There were no  vitals filed for this visit.   Subjective Assessment - 01/05/21 1318    Subjective No falls, no changes.    Pertinent History PMH of vertigo, GERD, macular degeneration, significant degenerative changes and anteriorlistesis    Patient Stated Goals To improve balance    Currently in Pain? No/denies   back pain worse in the mornings               Sensory Organization Test:  Condition 1:  WNL all 3 trials Condition 2:  WNL all 3 trials Condition 3:  Fall trial 1, WNL trials 2 and 3 Condition 4:  WNL trials 1 and 3, decreased trial 2 Condition 5:  Fall all 3 trials Condition 6:  Fall all 3 trials  Composite score:  41 (Normal for age/height range = approx 65)  Sensory Analysis:  Somatosensory WNL Vision:  Decreased (approx 68 and normal is 73) Vestibular System:  0 (normal is 50) Decreased preferential use of systems for balance  Ankle strategy dominant for conditions 5 and 6  COG tends to be more towards left                  Balance Exercises - 01/05/21 0001      Balance Exercises: Standing   Stepping  Strategy Anterior;Posterior;Foam/compliant surface;Limitations    Stepping Strategy Limitations Forward step and weightshift x 10 reps, alternating legs, then back step and weightshift x 5 reps alternating legs.  UE support and min guard assist.    Rockerboard Anterior/posterior;EO;Limitations    Rockerboard Limitations Ankle strategies, hip strategies in anterior/posterior direction, with cues for limits of stability and use of hip strategy as needed.  Intermittent UE support and min guard assistance.  Alt UE lifts, then arm swing alternating arms, then EO head turns x 5, head nods x 5 reps.  Standing on board with forward step taps x 5 reps, delayed reaction stepping each rep.             PT Education - 01/05/21 1410    Education Details Results of Sensory Organization test    Person(s) Educated Patient    Methods Explanation;Verbal cues;Handout     Comprehension Verbalized understanding            PT Short Term Goals - 12/25/20 0943      PT SHORT TERM GOAL #1   Title Pt will be independent with HEP for improved strength, balance, transfers, and gait.  TARGET 01/22/2021    Time 4    Period Weeks    Status New      PT SHORT TERM GOAL #2   Title Pt will improve 5x sit<>stand to less than or equal to 13 sec to demonstrate improved functional strength and transfer efficiency.    Baseline 16.75 sec    Time 4    Period Weeks    Status New      PT SHORT TERM GOAL #3   Title Pt will improve FGA score to at least 18/30 to decrease fall risk.    Baseline 13/30    Time 4    Status New      PT SHORT TERM GOAL #4   Title Pt will be able to stand with Eyes closed on compliant surface for at least 15 seconds to demo improved vestibular system function for balance.    Baseline 7 sec    Time 4    Period Weeks    Status New      PT SHORT TERM GOAL #5   Title Pt will verbalize understanding of fall prevention in home environment.    Time 4    Period Weeks    Status New             PT Long Term Goals - 12/25/20 0947      PT LONG TERM GOAL #1   Title Pt will be independent with progression of HEP for improved strength, balance, transfers, and gait.  TARGET 02/19/2021    Time 8    Period Weeks    Status New      PT LONG TERM GOAL #2   Title Pt will improve FGA score to at least 22/30 to decrease fall risk.    Time 8    Period Weeks    Status New      PT LONG TERM GOAL #3   Title Pt will stand eyes closed on compliant surface x 30 seconds to demo improved vestibular system use for balance.    Time 8    Period Weeks    Status New      PT LONG TERM GOAL #4   Title Pt will ambulate at least 1000 ft indoor and outdoor surfaces, using appropriate assistive device, mod I and no LOB, for improved community gait.  Time 8    Period Weeks    Status New                 Plan - 01/05/21 1411    Clinical Impression  Statement Sensory Organization Test assessed today, with pt noted to have overall composite balance score of 41 as well as decreased vestibular system use for balance.  Pt uses primarily ankle strategy>loss of balance (regained by therapist/stopping test) with conditions 5 and 6, demonstrating decreased vestibular system use for balance.  With exercises on rockerboard and [employer], demonstrated and explained use of hip and ankle strategy to work for maintaining balance.  Pt continues to have decreased use of hip strategy and delayed step strategy with work in parallel bars.  She will continue to benefit from skilled PT to further address balance and gait for improved overall functional mobility.    Personal Factors and Comorbidities Comorbidity 3+    Comorbidities high cholesterol, thyroid, anxiety, fractures R hand and ribs    Examination-Activity Limitations Locomotion Level;Transfers;Stand;Caring for Others    Examination-Participation Restrictions Community Activity    Stability/Clinical Decision Making Evolving/Moderate complexity    Rehab Potential Good    PT Frequency 2x / week    PT Duration 8 weeks   plus eval   PT Treatment/Interventions ADLs/Self Care Home Management;Gait training;Stair training;Functional mobility training;Therapeutic activities;Therapeutic exercise;Balance training;DME Instruction;Neuromuscular re-education;Patient/family education;Vestibular    PT Next Visit Plan Per new order received by [facility] for back:  assess back pain and add to POC as appropriate/update to [facility] and to MD for recert; add appts as needed.  Continue to work on balance strategies, varied surfaces, vision removed for balance/vestibular system retraining.    Consulted and Agree with Plan of Care Patient           Patient will benefit from skilled therapeutic intervention in order to improve the following deficits and impairments:  Abnormal gait,Difficulty walking,Decreased balance,Decreased  mobility,Decreased strength  Visit Diagnosis: Unsteadiness on feet  Other abnormalities of gait and mobility     Problem List Patient Active Problem List   Diagnosis Date Noted  . Acute left-sided low back pain without sciatica 08/06/2018  . Anxiety 07/11/2017  . Hypothyroidism 02/07/2015  . Arthralgia of multiple joints 05/28/2014  . Vitamin D deficiency 05/09/2012  . Osteopenia 05/26/2010  . Hypersomnia 02/22/2010  . Hyperlipidemia 01/21/2010  . Diverticulosis of large intestine 01/21/2010  . 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 SKIN CANCER, HX OF 01/21/2010    Frazier Butt. 01/05/2021, 2:16 PM Frazier Butt., PT  [facility] [address] [place], [place], [address] Phone: [phone]   Fax:  [phone]  Name: CONSWELLA BRUNEY MRN: [id number] Date of Birth: 05-Jan-1946

## 2021-01-06 ENCOUNTER — Ambulatory Visit: Payer: Medicare PPO | Admitting: Physical Therapy

## 2021-01-06 ENCOUNTER — Encounter: Payer: Self-pay | Admitting: Physical Therapy

## 2021-01-06 DIAGNOSIS — R293 Abnormal posture: Secondary | ICD-10-CM

## 2021-01-06 DIAGNOSIS — M545 Low back pain, unspecified: Secondary | ICD-10-CM | POA: Diagnosis not present

## 2021-01-06 DIAGNOSIS — M6281 Muscle weakness (generalized): Secondary | ICD-10-CM

## 2021-01-06 DIAGNOSIS — R2681 Unsteadiness on feet: Secondary | ICD-10-CM

## 2021-01-06 DIAGNOSIS — R2689 Other abnormalities of gait and mobility: Secondary | ICD-10-CM

## 2021-01-06 DIAGNOSIS — G8929 Other chronic pain: Secondary | ICD-10-CM

## 2021-01-06 NOTE — Therapy (Addendum)
[facility] [address] [place], [place], [address] Phone: [phone]   Fax:  [phone]  Physical Therapy Treatment/Re-eval  Patient Details  Name: Audrey [name] MRN: [id number] Date of Birth: Dec 26, 1945 Referring Provider (PT): Hal Hope Tamala Julian); cc'd to Cleta Alberts, [facility]   Encounter Date: 01/06/2021   PT End of Session - 01/06/21 1855    Visit Number 5    Number of Visits 24   per recert to include lumbar pain   Date for PT Re-Evaluation 93/81/82   90 day cert for 8 wk POC   Authorization Type [employer]-submitted upon completion of eval    Authorization Time Period 16 visits from 4/21- 03/23/21    Authorization - Visit Number 4    Authorization - Number of Visits 16    Progress Note Due on Visit 10    PT Start Time 1707    PT Stop Time 1747    PT Time Calculation (min) 40 min    Equipment Utilized During Treatment Gait belt    Activity Tolerance Patient tolerated treatment well   slightly increased pain at end of session, no pain reported with exercises.   Behavior During Therapy [facility] for tasks assessed/performed           Past Medical History:  Diagnosis Date  . Allergy    seasonal allegies; seem worse this year  . Arthritis   . Cancer ([place])    basal cell cancer on face removed; Dr Ronnald Ramp  . Fracture    right 4th and 5th metacarpal  . GERD (gastroesophageal reflux disease)   . Hearing aid worn    B/L  . HOH (hard of hearing)   . Hyperlipidemia   . Hypothyroidism   . Osteopenia after menopause   . Pneumonia   . Wears glasses     Past Surgical History:  Procedure Laterality Date  . COLONOSCOPY  2012   neg; [facility]  . HEMORRHOID SURGERY    . OPEN REDUCTION INTERNAL FIXATION (ORIF) METACARPAL Right 04/10/2020   Procedure: OPEN REDUCTION INTERNAL FIXATION (ORIF)  AS NECESSARY RIGHT FOURTH AND FIFTH METACARPAL FRACTURES;  Surgeon: [name], MD;  Location: [facility];  Service: Orthopedics;   Laterality: Right;  OPEN REDUCTION INTERNAL FIXATION (ORIF)  AS NECESSARY RIGHT FOURTH AND FIFTH METACARPAL FRACTURES    There were no vitals filed for this visit.   Subjective Assessment - 01/06/21 1711    Subjective In the mornings, I am doubled-over, and then I move and can move better.  In the mornings, my back is very stiff, almost 8-9/10 pain.  I walk doubled-over and each time I move, it eases off to move better.  Sitting and standing long periods aggravates.  Just feel tight and always feel 2-3/10 pain.  Mostly pain on R side and radiates to L, but not into legs.  Sleep on my stomach with R leg flexed.  Hurts to turn and change positions.    Pertinent History PMH of vertigo, GERD, macular degeneration, significant degenerative changes and anteriorlistesis    Diagnostic tests MRI imaging shows Moderate levoscoliosis of lumbar spine is noted. Grade 1  anterolisthesis of L4-5 is noted secondary to posterior facet joint  hypertrophy. Mild grade 1 retrolisthesis of L3-4 is noted secondary  to moderate degenerative disc disease at this level. Severe  degenerative disc disease is noted at L1-2 and L2-3 with anterior  osteophyte formation. No fracture is noted.  IMPRESSION:  Multilevel degenerative disc disease.  Patient Stated Goals To improve balance; to help be less stiff with back pain    Currently in Pain? Other (Comment)   See notes above.  Pt doesn't rate pain except as "sore" when asked.             [facility] PT Assessment - 01/06/21 0001      Assessment   Medical Diagnosis Low back pain    Referring Provider (PT) Hal Hope Tamala Julian); cc'd to Cleta Alberts, [facility]    Onset Date/Surgical Date 12/29/20   additional order for low back pain     Precautions   Precautions --   No additional restrictions for back, per patient     Observation/Other Assessments   Observations Pain standing baseline:  0/10; repeated flexion x 5 reps, 2-3/10 pain R lumbar paraspinals; repeated extension x 5 reps,  returned from 3/10 to 0/10 after extension.      Posture/Postural Control   Posture Comments R shoulder lower than L; generally stands in increaed lumbar lordosis      Palpation   Palpation comment No pain to palpation along lumbar musculature, but pt reports general soreness R      Special Tests   Other special tests No pain with SLR test; no radiating symptoms                         [facility] Adult PT Treatment/Exercise - 01/06/21 0001      Self-Care   Self-Care Posture;Other Self-Care Comments    Posture Educated patient in sitting posture-use of  pillow in lumbar spine area for lumbar support    Other Self-Care Comments  Educated pt in use of pillow between knees in sidelying and under knees in supine.  Educated pt in more optimal sleeping position (to try to avoid excess R knee/hip flexion in prone position).  Educated and practiced log roll>sidelying to sit edge of bed; all of the above to educate on more neutral spine positioning.      Exercises   Exercises Lumbar      Lumbar Exercises: Stretches   Single Knee to Chest Stretch Right;Left;3 reps;10 seconds    Lower Trunk Rotation 3 reps;10 seconds   Initiated with rocking side to side     Lumbar Exercises: Supine   Pelvic Tilt 5 reps    Pelvic Tilt Limitations Gentle anterior/posterior pelvic tilt, with PT tactile cues at ASIS                  PT Education - 01/06/21 1854    Education Details Results of reassessment to include low back pain (per order from [facility]); POC to include increased frequency to address lumbar pain in addition to balance. Additions to [employer]) Educated Patient    Methods Explanation;Demonstration;Handout    Comprehension Verbalized understanding            PT Short Term Goals - 01/06/21 1903      PT SHORT TERM GOAL #1   Title Pt will be independent with HEP for improved strength, balance, transfers, and gait.  TARGET 01/22/2021    Time 4    Period  Weeks    Status On-going      PT SHORT TERM GOAL #2   Title Pt will improve 5x sit<>stand to less than or equal to 13 sec to demonstrate improved functional strength and transfer efficiency.    Baseline 16.75 sec    Time 4  Period Weeks    Status On-going      PT SHORT TERM GOAL #3   Title Pt will improve FGA score to at least 18/30 to decrease fall risk.    Baseline 13/30    Time 4    Status On-going      PT SHORT TERM GOAL #4   Title Pt will be able to stand with Eyes closed on compliant surface for at least 15 seconds to demo improved vestibular system function for balance.    Baseline 7 sec    Time 4    Period Weeks    Status On-going      PT SHORT TERM GOAL #5   Title Pt will verbalize understanding of fall prevention in home environment.    Time 4    Period Weeks    Status On-going      Additional Short Term Goals   Additional Short Term Goals Yes      PT SHORT TERM GOAL #6   Title Pt will verbalize/demo understanding of posture/positioning to decreased low back pain with functional mobility.  TARGET 01/22/2021    Time 2    Period Weeks    Status New             PT Long Term Goals - 01/06/21 1904      PT LONG TERM GOAL #1   Title Pt will be independent with progression of HEP for improved strength, back pain, balance, transfers, and gait.  TARGET  for all LTGs 02/19/2021    Time 8    Period Weeks    Status Revised      PT LONG TERM GOAL #2   Title Pt will improve FGA score to at least 22/30 to decrease fall risk.    Time 8    Period Weeks    Status On-going      PT LONG TERM GOAL #3   Title Pt will stand eyes closed on compliant surface x 30 seconds to demo improved vestibular system use for balance.    Time 8    Period Weeks    Status On-going      PT LONG TERM GOAL #4   Title Pt will ambulate at least 1000 ft indoor and outdoor surfaces, using appropriate assistive device, mod I and no LOB, for improved community gait.    Time 8    Period  Weeks    Status On-going      PT LONG TERM GOAL #5   Title Pt will improve SOT composite score to at least 50 for improved overall balance.    Time 6    Period Weeks    Status New      Additional Long Term Goals   Additional Long Term Goals Yes      PT LONG TERM GOAL #6   Title [employer] Back pain questionaire to be assessed, with goal to be written, to demo improved overall back pain with functional activities.    Time 6    Period Weeks    Status New      PT LONG TERM GOAL #7   Title Pt will report at least 50% improvement in back pain during morning hours, to improve overall functional mobility with decreased pain.    Baseline reports 8-9/10    Time [place]    Status New                 Plan - 01/06/21 1856  Clinical Impression Statement Reassessment performed today to include lumbar pain (per order from [facility])-will forward POC to both Dr. Tamala Julian and to ortho PA.  Pt does not appear to have any radiating back pain, rather more stiffness and soreness, up to 3/10 aggravated by lumbar flexion movements.  She has hx of degenerative disc disease with scoliosis present as well as increased lumbar lordosis; pain and stiffness worse in the morning and with prolonged sitting or standing.  Pt's pain limits daily activities at these times.  She would benefit from addition to POC to address lumbar pain, posture and abdominal strengthening, possible use of modalities to improve pain for improved overall pain and participation in ADLs and functional mobility.    Personal Factors and Comorbidities Comorbidity 3+    Comorbidities high cholesterol, thyroid, anxiety, fractures R hand and ribs    Examination-Activity Limitations Locomotion Level;Transfers;Stand;Caring for Others    Examination-Participation Restrictions Community Activity    Stability/Clinical Decision Making Evolving/Moderate complexity    Rehab Potential Good    PT Frequency 3x / week    PT Duration 8  weeks   3x/wk to start week of 4/0/9811 per recert 05/06/4781   PT Treatment/Interventions ADLs/Self Care Home Management;Gait training;Stair training;Functional mobility training;Therapeutic activities;Therapeutic exercise;Balance training;DME Instruction;Neuromuscular re-education;Patient/family education;Vestibular    PT Next Visit Plan FOR BACK:  review HEP, posture and positioning education (including getting out of bed, in/out of car), manual traction, lumbar flexibility, lumbar/abdominal stability; update to [facility] and to MD for recert; add appts as needed (should be being seend 3x/wk starting wk of 5/4).    FOR BALANCE:  Continue to work on balance strategies, varied surfaces, vision removed for balance/vestibular system retraining.    Consulted and Agree with Plan of Care Patient           Per addendum:  Note additions to POC:   Plan - 01/06/21 1856    Clinical Impression Statement Reassessment performed today to include lumbar pain (per order from [facility])-will forward POC to both Dr. Tamala Julian and to ortho PA.  Pt does not appear to have any radiating back pain, rather more stiffness and soreness, up to 3/10 aggravated by lumbar flexion movements.  She has hx of degenerative disc disease with scoliosis present as well as increased lumbar lordosis; pain and stiffness worse in the morning and with prolonged sitting or standing.  Pt's pain limits daily activities at these times.  She would benefit from addition to POC to address lumbar pain, posture and abdominal strengthening, possible use of modalities to improve pain for improved overall pain and participation in ADLs and functional mobility.    Personal Factors and Comorbidities Comorbidity 3+    Comorbidities high cholesterol, thyroid, anxiety, fractures R hand and ribs    Examination-Activity Limitations Locomotion Level;Transfers;Stand;Caring for Others    Examination-Participation Restrictions Community Activity     Stability/Clinical Decision Making Evolving/Moderate complexity    Rehab Potential Good    PT Frequency 3x / week    PT Duration 8 weeks   3x/wk to start week of 05/10/6212 per recert 0/04/6577   PT Treatment/Interventions ADLs/Self Care Home Management;Gait training;Stair training;Functional mobility training;Therapeutic activities;Therapeutic exercise;Balance training;DME Instruction;Neuromuscular re-education;Patient/family education;Vestibular;Moist Heat;Traction;Ultrasound;Electrical Stimulation;Manual techniques    PT Next Visit Plan FOR BACK:  review HEP, posture and positioning education (including getting out of bed, in/out of car), manual traction, lumbar flexibility, lumbar/abdominal stability; update to [facility] and to MD for recert; add appts as needed (should be being seend 3x/wk starting wk  of 5/4).    FOR BALANCE:  Continue to work on balance strategies, varied surfaces, vision removed for balance/vestibular system retraining.    Consulted and Agree with Plan of Care Patient            Patient will benefit from skilled therapeutic intervention in order to improve the following deficits and impairments:  Abnormal gait,Difficulty walking,Decreased balance,Decreased mobility,Decreased strength  Visit Diagnosis: Chronic right-sided low back pain without sciatica  Abnormal posture   Other abnormalities of gait and mobility  Unsteadiness on Feet  Muscle Weakness      Problem List Patient Active Problem List   Diagnosis Date Noted  . Acute left-sided low back pain without sciatica 08/06/2018  . Anxiety 07/11/2017  . Hypothyroidism 02/07/2015  . Arthralgia of multiple joints 05/28/2014  . Vitamin D deficiency 05/09/2012  . Osteopenia 05/26/2010  . Hypersomnia 02/22/2010  . Hyperlipidemia 01/21/2010  . Diverticulosis of large intestine 01/21/2010  . 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 SKIN CANCER, HX OF 01/21/2010    Frazier Butt. 01/06/2021, 7:09  PM Frazier Butt., PT  [facility] [address] [place], [place], [address] Phone: [phone]   Fax:  [phone]  Name: TYASHIA NINE MRN: [id number] Date of Birth: 1946-08-02

## 2021-01-06 NOTE — Patient Instructions (Signed)
Access Code: [email or web address] URL: [email or web address] Date: 01/06/2021 Prepared by: Mady Haagensen  Exercises Heel Toe Raises with Counter Support - 1-2 x daily - 5 x weekly - 2 sets - 10 reps Alternating Step Backward with Support - 1-2 x daily - 5 x weekly - 1-2 sets - 10 reps Side Stepping with Counter Support - 1-2 x daily - 5 x weekly - 3 sets Standing Marching - 1-2 x daily - 5 x weekly - 1 sets - 10 reps  Added 01/06/2021:  Educated to perform first thing in the morning prior to getting out of bed Supine Posterior Pelvic Tilt - 1 x daily - 7 x weekly - 1 sets - 5-10 reps Hooklying Single Knee to Chest Stretch - 1 x daily - 7 x weekly - 1 sets - 3 reps - 10 sec hold Supine Lower Trunk Rotation - 1 x daily - 7 x weekly - 1 sets - 5 reps - 10-15 sec hold

## 2021-01-07 NOTE — Addendum Note (Signed)
Addended by: Frazier Butt on: 01/07/2021 09:21 AM   Modules accepted: Orders

## 2021-01-08 ENCOUNTER — Ambulatory Visit: Payer: Medicare PPO | Admitting: Physical Therapy

## 2021-01-08 ENCOUNTER — Other Ambulatory Visit: Payer: Self-pay

## 2021-01-08 ENCOUNTER — Encounter: Payer: Self-pay | Admitting: Physical Therapy

## 2021-01-08 DIAGNOSIS — M545 Low back pain, unspecified: Secondary | ICD-10-CM

## 2021-01-08 DIAGNOSIS — G8929 Other chronic pain: Secondary | ICD-10-CM | POA: Diagnosis not present

## 2021-01-08 DIAGNOSIS — M6281 Muscle weakness (generalized): Secondary | ICD-10-CM | POA: Diagnosis not present

## 2021-01-08 DIAGNOSIS — R293 Abnormal posture: Secondary | ICD-10-CM | POA: Diagnosis not present

## 2021-01-08 DIAGNOSIS — R2689 Other abnormalities of gait and mobility: Secondary | ICD-10-CM | POA: Diagnosis not present

## 2021-01-08 DIAGNOSIS — R2681 Unsteadiness on feet: Secondary | ICD-10-CM | POA: Diagnosis not present

## 2021-01-08 NOTE — Therapy (Signed)
[facility] [address] [place], [place], [address] Phone: [phone]   Fax:  [phone]  Physical Therapy Treatment  Patient Details  Name: Audrey [name] MRN: [id number] Date of Birth: Jan 30, 1946 Referring Provider (PT): Hal Hope Tamala Julian); cc'd to Cleta Alberts, [facility]   Encounter Date: 01/08/2021   PT End of Session - 01/08/21 1021    Visit Number 6    Number of Visits 24   per recert to include lumbar pain   Date for PT Re-Evaluation 76/16/07   90 day cert for 8 wk POC   Authorization Type [employer]-submitted upon completion of eval    Authorization Time Period 16 visits from 4/21- 03/23/21    Authorization - Visit Number 5    Authorization - Number of Visits 16    Progress Note Due on Visit 10    PT Start Time 1019    PT Stop Time 1100    PT Time Calculation (min) 41 min    Equipment Utilized During Treatment Gait belt    Activity Tolerance Patient tolerated treatment well;No increased pain    Behavior During Therapy WFL for tasks assessed/performed           Past Medical History:  Diagnosis Date  . Allergy    seasonal allegies; seem worse this year  . Arthritis   . Cancer ([place])    basal cell cancer on face removed; Dr Ronnald Ramp  . Fracture    right 4th and 5th metacarpal  . GERD (gastroesophageal reflux disease)   . Hearing aid worn    B/L  . HOH (hard of hearing)   . Hyperlipidemia   . Hypothyroidism   . Osteopenia after menopause   . Pneumonia   . Wears glasses     Past Surgical History:  Procedure Laterality Date  . COLONOSCOPY  2012   neg; [facility]  . HEMORRHOID SURGERY    . OPEN REDUCTION INTERNAL FIXATION (ORIF) METACARPAL Right 04/10/2020   Procedure: OPEN REDUCTION INTERNAL FIXATION (ORIF)  AS NECESSARY RIGHT FOURTH AND FIFTH METACARPAL FRACTURES;  Surgeon: [name], MD;  Location: [facility];  Service: Orthopedics;  Laterality: Right;  OPEN REDUCTION INTERNAL FIXATION (ORIF)  AS  NECESSARY RIGHT FOURTH AND FIFTH METACARPAL FRACTURES    There were no vitals filed for this visit.   Subjective Assessment - 01/08/21 1020    Subjective Trying to use the pillow between my knees.  Kind of hard to find time to get the bed exercises done.  No pain right now.    Pertinent History PMH of vertigo, GERD, macular degeneration, significant degenerative changes and anteriorlistesis    Diagnostic tests MRI imaging shows Moderate levoscoliosis of lumbar spine is noted. Grade 1  anterolisthesis of L4-5 is noted secondary to posterior facet joint  hypertrophy. Mild grade 1 retrolisthesis of L3-4 is noted secondary  to moderate degenerative disc disease at this level. Severe  degenerative disc disease is noted at L1-2 and L2-3 with anterior  osteophyte formation. No fracture is noted.  IMPRESSION:  Multilevel degenerative disc disease.    Patient Stated Goals To improve balance; to help be less stiff with back pain    Currently in Pain? Other (Comment)   No pain right now; 8/10 this morning.                            [facility] Adult PT Treatment/Exercise - 01/08/21 0001  Self-Care   Self-Care Posture;Other Self-Care Comments    Posture Reviewed log rolling, use of pillows for positioning in supine, sidelying, and in sitting.    Other Self-Care Comments  Standing wide BOS lateral weigthshifting, stagger stance weightshifting, with education on use of these positions to avoid prolonged static standing.  Educated pt in position of more neutral spine to avoid excess lumbar lordosis.      Lumbar Exercises: Stretches   Other Lumbar Stretch Exercise Standing L stretch, at outside parallel bars, 3 reps x 10 seconds, with cues for positioning.      Lumbar Exercises: Seated   Other Seated Lumbar Exercises Seated abdominal activation wtih alt UE lifts x 5 reps; seated march x 5 reps, then alt UE/leg lifts x 5 reps each.      Lumbar Exercises: Supine   Ab Set 5 reps;3 seconds    2 sets x 5 reps   Bridge Non-compliant;5 reps;3 seconds   2 sets x 5 reps   Bridge Limitations Cues for abdominal set then bridge    Other Supine Lumbar Exercises Hooklying abdominal activation with:  hip/knee flexion x 5 reps, then with clamshell open/close bilat x 5 reps            Reviewed HEP given last visit, with pt return demo understanding:  Supine Posterior Pelvic Tilt - 1 x daily - 7 x weekly - 1 sets - 5-10 reps Hooklying Single Knee to Chest Stretch - 1 x daily - 7 x weekly - 1 sets - 3 reps - 10 sec hold Supine Lower Trunk Rotation - 1 x daily - 7 x weekly - 1 sets - 5 reps - 10-15 sec hold  Manual traction provided to each leg extended, supine position x 5 reps, 10 sec Supine leg press into mat, 5 reps each leg, 5 sec hold Supine leg lengthener, 5 reps each, 5 sec hold.        PT Education - 01/08/21 1150    Education Details use of pillows for posture, positioning, neutral spine position    Person(s) Educated Patient    Methods Explanation;Demonstration;Verbal cues    Comprehension Verbalized understanding;Verbal cues required            PT Short Term Goals - 01/06/21 1903      PT SHORT TERM GOAL #1   Title Pt will be independent with HEP for improved strength, balance, transfers, and gait.  TARGET 01/22/2021    Time 4    Period Weeks    Status On-going      PT SHORT TERM GOAL #2   Title Pt will improve 5x sit<>stand to less than or equal to 13 sec to demonstrate improved functional strength and transfer efficiency.    Baseline 16.75 sec    Time 4    Period Weeks    Status On-going      PT SHORT TERM GOAL #3   Title Pt will improve FGA score to at least 18/30 to decrease fall risk.    Baseline 13/30    Time 4    Status On-going      PT SHORT TERM GOAL #4   Title Pt will be able to stand with Eyes closed on compliant surface for at least 15 seconds to demo improved vestibular system function for balance.    Baseline 7 sec    Time 4    Period  Weeks    Status On-going      PT SHORT TERM GOAL #5  Title Pt will verbalize understanding of fall prevention in home environment.    Time 4    Period Weeks    Status On-going      Additional Short Term Goals   Additional Short Term Goals Yes      PT SHORT TERM GOAL #6   Title Pt will verbalize/demo understanding of posture/positioning to decreased low back pain with functional mobility.  TARGET 01/22/2021    Time 2    Period Weeks    Status New             PT Long Term Goals - 01/08/21 1153      PT LONG TERM GOAL #1   Title Pt will be independent with progression of HEP for improved strength, back pain, balance, transfers, and gait.  TARGET  for all LTGs 02/19/2021    Time 8    Period Weeks    Status Revised      PT LONG TERM GOAL #2   Title Pt will improve FGA score to at least 22/30 to decrease fall risk.    Time 8    Period Weeks    Status On-going      PT LONG TERM GOAL #3   Title Pt will stand eyes closed on compliant surface x 30 seconds to demo improved vestibular system use for balance.    Time 8    Period Weeks    Status On-going      PT LONG TERM GOAL #4   Title Pt will ambulate at least 1000 ft indoor and outdoor surfaces, using appropriate assistive device, mod I and no LOB, for improved community gait.    Time 8    Period Weeks    Status On-going      PT LONG TERM GOAL #5   Title Pt will improve SOT composite score to at least 50 for improved overall balance.    Time 6    Period Weeks    Status New      PT LONG TERM GOAL #6   Title Oswestry Back pain questionaire to improve to less than or equal to 15% disability, to demo improved overall back pain with functional activities.    Time 6    Period Weeks    Status Revised      PT LONG TERM GOAL #7   Title Pt will report at least 50% improvement in back pain during morning hours, to improve overall functional mobility with decreased pain.    Baseline reports 8-9/10    Time 6    Period Weeks     Status New                 Plan - 01/08/21 1054    Clinical Impression Statement Focus of today's skilled PT session is additional low back flexibility, stability exercises, in supine and standing positions, with additional education in neutral spine posture.  Pt tends with standing to go into increased lumbar lordosis and PT provides education, tactile/verbal cues for more neutral spine and weightshifting in standing to avoid prolonged static standing positions.  Oswestry score completed, with pt indicating 20% disability.  Pt will continue to benefit from skilled PT to further address back pain/posture as well as balance and gait for overall improved functional mobility.    Personal Factors and Comorbidities Comorbidity 3+    Comorbidities high cholesterol, thyroid, anxiety, fractures R hand and ribs    Examination-Activity Limitations Locomotion Level;Transfers;Stand;Caring for Others    Examination-Participation Restrictions Community  Activity    Stability/Clinical Decision Making Evolving/Moderate complexity    Rehab Potential Good    PT Frequency 3x / week    PT Duration 8 weeks   3x/wk to start week of 05/06/6944 per recert 0/11/8880   PT Treatment/Interventions ADLs/Self Care Home Management;Gait training;Stair training;Functional mobility training;Therapeutic activities;Therapeutic exercise;Balance training;DME Instruction;Neuromuscular re-education;Patient/family education;Vestibular;Moist Heat;Traction;Ultrasound;Electrical Stimulation;Manual techniques    PT Next Visit Plan FOR BACK:  update HEP for flexibility/stability; review posture and positioning education (including getting out of bed, in/out of car), manual traction, lumbar flexibility, lumbar/abdominal stability; update to [facility] and to MD for recert (was completed 8/0/0349 visit); add appts as needed (should be being seend 3x/wk starting wk of 5/4).    FOR BALANCE:  Continue to work on balance strategies, varied surfaces,  vision removed for balance/vestibular system retraining.    Consulted and Agree with Plan of Care Patient           Patient will benefit from skilled therapeutic intervention in order to improve the following deficits and impairments:  Abnormal gait,Difficulty walking,Decreased balance,Decreased mobility,Decreased strength  Visit Diagnosis: Chronic right-sided low back pain without sciatica  Abnormal posture     Problem List Patient Active Problem List   Diagnosis Date Noted  . Acute left-sided low back pain without sciatica 08/06/2018  . Anxiety 07/11/2017  . Hypothyroidism 02/07/2015  . Arthralgia of multiple joints 05/28/2014  . Vitamin D deficiency 05/09/2012  . Osteopenia 05/26/2010  . Hypersomnia 02/22/2010  . Hyperlipidemia 01/21/2010  . Diverticulosis of large intestine 01/21/2010  . 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 SKIN CANCER, HX OF 01/21/2010    Jurell Basista W. 01/08/2021, 11:54 AM  Frazier Butt., PT   [facility] [address] [place], [place], [address] Phone: [phone]   Fax:  [phone]  Name: Audrey [name] MRN: [id number] Date of Birth: 01/19/1946

## 2021-01-12 ENCOUNTER — Other Ambulatory Visit: Payer: Self-pay

## 2021-01-12 ENCOUNTER — Ambulatory Visit: Payer: Medicare PPO | Admitting: Physical Therapy

## 2021-01-12 DIAGNOSIS — M6281 Muscle weakness (generalized): Secondary | ICD-10-CM | POA: Diagnosis not present

## 2021-01-12 DIAGNOSIS — R2689 Other abnormalities of gait and mobility: Secondary | ICD-10-CM

## 2021-01-12 DIAGNOSIS — M545 Low back pain, unspecified: Secondary | ICD-10-CM | POA: Diagnosis not present

## 2021-01-12 DIAGNOSIS — R293 Abnormal posture: Secondary | ICD-10-CM | POA: Diagnosis not present

## 2021-01-12 DIAGNOSIS — R2681 Unsteadiness on feet: Secondary | ICD-10-CM | POA: Diagnosis not present

## 2021-01-12 DIAGNOSIS — G8929 Other chronic pain: Secondary | ICD-10-CM | POA: Diagnosis not present

## 2021-01-13 NOTE — Therapy (Signed)
[facility] [address] [place], [place], [address] Phone: [phone]   Fax:  [phone]  Physical Therapy Treatment  Patient Details  Name: Audrey [name] MRN: [id number] Date of Birth: 1946-08-26 Referring Provider (PT): Hal Hope Tamala Julian); cc'd to Cleta Alberts, [facility]   Encounter Date: 01/12/2021   PT End of Session - 01/12/21 1308    Visit Number 7    Number of Visits 24   per recert to include lumbar pain   Date for PT Re-Evaluation 99/24/26   90 day cert for 8 wk POC   Authorization Type [employer]-submitted upon completion of eval    Authorization Time Period 24 visits from 4/21 - 6/3 - updated    Authorization - Visit Number 6    Authorization - Number of Visits 24    Progress Note Due on Visit 10    PT Start Time 1310    PT Stop Time 1356    PT Time Calculation (min) 46 min    Activity Tolerance Patient tolerated treatment well    Behavior During Therapy [facility] for tasks assessed/performed           Past Medical History:  Diagnosis Date  . Allergy    seasonal allegies; seem worse this year  . Arthritis   . Cancer ([place])    basal cell cancer on face removed; Dr Ronnald Ramp  . Fracture    right 4th and 5th metacarpal  . GERD (gastroesophageal reflux disease)   . Hearing aid worn    B/L  . HOH (hard of hearing)   . Hyperlipidemia   . Hypothyroidism   . Osteopenia after menopause   . Pneumonia   . Wears glasses     Past Surgical History:  Procedure Laterality Date  . COLONOSCOPY  2012   neg; [facility]  . HEMORRHOID SURGERY    . OPEN REDUCTION INTERNAL FIXATION (ORIF) METACARPAL Right 04/10/2020   Procedure: OPEN REDUCTION INTERNAL FIXATION (ORIF)  AS NECESSARY RIGHT FOURTH AND FIFTH METACARPAL FRACTURES;  Surgeon: [name], MD;  Location: [facility];  Service: Orthopedics;  Laterality: Right;  OPEN REDUCTION INTERNAL FIXATION (ORIF)  AS NECESSARY RIGHT FOURTH AND FIFTH METACARPAL FRACTURES     There were no vitals filed for this visit.   Subjective Assessment - 01/12/21 1315    Subjective Back is stil painful but helped take care of grandchild this past weekend and was on her feet a lot, bending down, unloading dish washer, washing dishes, etc.  Mornings are the worst, a hot shower helps.  Pain with rolling.  Has a hard time using pillow between knees because she sleeps on her stomach with one knee bent up.    Pertinent History PMH of vertigo, GERD, macular degeneration, significant degenerative changes and anteriorlistesis    Diagnostic tests MRI imaging shows Moderate levoscoliosis of lumbar spine is noted. Grade 1  anterolisthesis of L4-5 is noted secondary to posterior facet joint  hypertrophy. Mild grade 1 retrolisthesis of L3-4 is noted secondary  to moderate degenerative disc disease at this level. Severe  degenerative disc disease is noted at L1-2 and L2-3 with anterior  osteophyte formation. No fracture is noted.  IMPRESSION:  Multilevel degenerative disc disease.    Patient Stated Goals To improve balance; to help be less stiff with back pain    Currently in Pain? Yes  Balance Exercises - 01/12/21 1320      Balance Exercises: Standing   Standing Eyes Closed Narrow base of support (BOS);Wide (BOA);Foam/compliant surface;5 reps;10 secs    SLS with Vectors Solid surface;5 reps;Limitations    SLS with Vectors Limitations Standing and reaching one foot to target to ipsilateral side (ER), forwards and then across midline.  Performed 5 reps (3 taps) on each LE with min-mod A for balance and cues for core and proximal hip activation in stance phase to improve stability.    Standing, One Foot on a Step Eyes open;4 inch;Limitations    Standing, One Foot on a Step Limitations Standing with one foot on step for partial SLS focusing on closed chain glute med activation to level hips and then lift foot off step and hold x 5  seconds x 6-7 reps each LE.    Wall Bumps Hip;Eyes closed;5 reps;Limitations    Wall Bumps Limitations standing on foam, back and forth to wall behind pt, min-mod A    Step Ups Forward;Lateral;4 inch;Intermittent UE support;Limitations    Step Ups Limitations 10 reps each, alternating LE: step up with contralateral LE lift, step downs and then lateral step ups.  Intermittent UE support due to LOB; with step ups pt experienced more LOB and required min A for safety.  Hip drop noted in SLS.             PT Education - 01/13/21 1710    Education Details educated pt that some exercises performed in therapy would be for session only and not every exercise is given for HEP.    Person(s) Educated Patient    Methods Explanation    Comprehension Verbalized understanding            PT Short Term Goals - 01/06/21 1903      PT SHORT TERM GOAL #1   Title Pt will be independent with HEP for improved strength, balance, transfers, and gait.  TARGET 01/22/2021    Time 4    Period Weeks    Status On-going      PT SHORT TERM GOAL #2   Title Pt will improve 5x sit<>stand to less than or equal to 13 sec to demonstrate improved functional strength and transfer efficiency.    Baseline 16.75 sec    Time 4    Period Weeks    Status On-going      PT SHORT TERM GOAL #3   Title Pt will improve FGA score to at least 18/30 to decrease fall risk.    Baseline 13/30    Time 4    Status On-going      PT SHORT TERM GOAL #4   Title Pt will be able to stand with Eyes closed on compliant surface for at least 15 seconds to demo improved vestibular system function for balance.    Baseline 7 sec    Time 4    Period Weeks    Status On-going      PT SHORT TERM GOAL #5   Title Pt will verbalize understanding of fall prevention in home environment.    Time 4    Period Weeks    Status On-going      Additional Short Term Goals   Additional Short Term Goals Yes      PT SHORT TERM GOAL #6   Title Pt will  verbalize/demo understanding of posture/positioning to decreased low back pain with functional mobility.  TARGET 01/22/2021    Time 2  Period Weeks    Status New             PT Long Term Goals - 01/08/21 1153      PT LONG TERM GOAL #1   Title Pt will be independent with progression of HEP for improved strength, back pain, balance, transfers, and gait.  TARGET  for all LTGs 02/19/2021    Time 8    Period Weeks    Status Revised      PT LONG TERM GOAL #2   Title Pt will improve FGA score to at least 22/30 to decrease fall risk.    Time 8    Period Weeks    Status On-going      PT LONG TERM GOAL #3   Title Pt will stand eyes closed on compliant surface x 30 seconds to demo improved vestibular system use for balance.    Time 8    Period Weeks    Status On-going      PT LONG TERM GOAL #4   Title Pt will ambulate at least 1000 ft indoor and outdoor surfaces, using appropriate assistive device, mod I and no LOB, for improved community gait.    Time 8    Period Weeks    Status On-going      PT LONG TERM GOAL #5   Title Pt will improve SOT composite score to at least 50 for improved overall balance.    Time 6    Period Weeks    Status New      PT LONG TERM GOAL #6   Title Oswestry Back pain questionaire to improve to less than or equal to 15% disability, to demo improved overall back pain with functional activities.    Time 6    Period Weeks    Status Revised      PT LONG TERM GOAL #7   Title Pt will report at least 50% improvement in back pain during morning hours, to improve overall functional mobility with decreased pain.    Baseline reports 8-9/10    Time 6    Period Weeks    Status New                 Plan - 01/12/21 1311    Clinical Impression Statement Pt is performing exercises at home but may benefit from addition of core and proximal hip strengthening exercises due to significant weakness and impaired postural control during balance activities.  Pt  has significant difficulty maintaining balance during single limb stance, narrow BOS and when vision removed.  Will continue to address in order to progress towards LTG.    Personal Factors and Comorbidities Comorbidity 3+    Comorbidities high cholesterol, thyroid, anxiety, fractures R hand and ribs    Examination-Activity Limitations Locomotion Level;Transfers;Stand;Caring for Others    Examination-Participation Restrictions Community Activity    Stability/Clinical Decision Making Evolving/Moderate complexity    Rehab Potential Good    PT Frequency 3x / week    PT Duration 8 weeks   3x/wk to start week of 11/05/2023 per recert 12/05/7060   PT Treatment/Interventions ADLs/Self Care Home Management;Gait training;Stair training;Functional mobility training;Therapeutic activities;Therapeutic exercise;Balance training;DME Instruction;Neuromuscular re-education;Patient/family education;Vestibular;Moist Heat;Traction;Ultrasound;Electrical Stimulation;Manual techniques    PT Next Visit Plan FOR BACK:  update HEP for flexibility/stability; review posture and positioning education (including getting out of bed, in/out of car), manual traction, lumbar flexibility, lumbar/abdominal stability; update to [facility] and to MD for recert (was completed 11/09/6281 visit); add appts as needed (should be  being seend 3x/wk starting wk of 5/4).    FOR BALANCE:  Continue to work on balance strategies, varied surfaces, vision removed for balance/vestibular system retraining, core and proximal hip strengthening to improve balance with single limb stance, stepping up/down curbs/stairs, turning    Consulted and Agree with Plan of Care Patient           Patient will benefit from skilled therapeutic intervention in order to improve the following deficits and impairments:  Abnormal gait,Difficulty walking,Decreased balance,Decreased mobility,Decreased strength  Visit Diagnosis: Unsteadiness on feet  Other abnormalities of gait and  mobility  Muscle weakness (generalized)     Problem List Patient Active Problem List   Diagnosis Date Noted  . Acute left-sided low back pain without sciatica 08/06/2018  . Anxiety 07/11/2017  . Hypothyroidism 02/07/2015  . Arthralgia of multiple joints 05/28/2014  . Vitamin D deficiency 05/09/2012  . Osteopenia 05/26/2010  . Hypersomnia 02/22/2010  . Hyperlipidemia 01/21/2010  . Diverticulosis of large intestine 01/21/2010  . 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 SKIN CANCER, HX OF 01/21/2010    Rico Junker, PT, DPT 01/13/21    5:18 PM    [facility] [address] [place], [place], [address] Phone: [phone]   Fax:  [phone]  Name: Audrey [name] MRN: [id number] Date of Birth: [birth year]

## 2021-01-15 ENCOUNTER — Encounter: Payer: Self-pay | Admitting: Physical Therapy

## 2021-01-15 ENCOUNTER — Ambulatory Visit: Payer: Medicare PPO | Admitting: Physical Therapy

## 2021-01-15 ENCOUNTER — Other Ambulatory Visit: Payer: Self-pay

## 2021-01-15 DIAGNOSIS — R293 Abnormal posture: Secondary | ICD-10-CM

## 2021-01-15 DIAGNOSIS — G8929 Other chronic pain: Secondary | ICD-10-CM | POA: Diagnosis not present

## 2021-01-15 DIAGNOSIS — M545 Low back pain, unspecified: Secondary | ICD-10-CM

## 2021-01-15 DIAGNOSIS — R2689 Other abnormalities of gait and mobility: Secondary | ICD-10-CM | POA: Diagnosis not present

## 2021-01-15 DIAGNOSIS — M6281 Muscle weakness (generalized): Secondary | ICD-10-CM

## 2021-01-15 DIAGNOSIS — R2681 Unsteadiness on feet: Secondary | ICD-10-CM | POA: Diagnosis not present

## 2021-01-15 NOTE — Therapy (Signed)
[facility] [address] [place], [place], [address] Phone: [phone]   Fax:  [phone]  Physical Therapy Treatment  Patient Details  Name: Audrey [name] MRN: [id number] Date of Birth: 04/01/1946 Referring Provider (PT): Hal Hope Tamala Julian); cc'd to Cleta Alberts, [facility]   Encounter Date: 01/15/2021   PT End of Session - 01/15/21 1406    Visit Number 8    Number of Visits 24   per recert to include lumbar pain   Date for PT Re-Evaluation 19/62/22   90 day cert for 8 wk POC   Authorization Type [employer]-submitted upon completion of eval    Authorization Time Period 24 visits from 4/21 - 6/3 - updated    Authorization - Visit Number 7    Authorization - Number of Visits 24    Progress Note Due on Visit 10    PT Start Time 9798    PT Stop Time 1445    PT Time Calculation (min) 40 min    Equipment Utilized During Treatment Gait belt    Activity Tolerance Patient tolerated treatment well    Behavior During Therapy [facility] for tasks assessed/performed           Past Medical History:  Diagnosis Date  . Allergy    seasonal allegies; seem worse this year  . Arthritis   . Cancer ([place])    basal cell cancer on face removed; Dr Ronnald Ramp  . Fracture    right 4th and 5th metacarpal  . GERD (gastroesophageal reflux disease)   . Hearing aid worn    B/L  . HOH (hard of hearing)   . Hyperlipidemia   . Hypothyroidism   . Osteopenia after menopause   . Pneumonia   . Wears glasses     Past Surgical History:  Procedure Laterality Date  . COLONOSCOPY  2012   neg; [facility]  . HEMORRHOID SURGERY    . OPEN REDUCTION INTERNAL FIXATION (ORIF) METACARPAL Right 04/10/2020   Procedure: OPEN REDUCTION INTERNAL FIXATION (ORIF)  AS NECESSARY RIGHT FOURTH AND FIFTH METACARPAL FRACTURES;  Surgeon: [name], MD;  Location: [place] [facility];  Service: Orthopedics;  Laterality: Right;  OPEN REDUCTION INTERNAL FIXATION (ORIF)  AS NECESSARY  RIGHT FOURTH AND FIFTH METACARPAL FRACTURES    There were no vitals filed for this visit.   Subjective Assessment - 01/15/21 1407    Subjective No new complains. Back is good right now, today is a "good day".    Pertinent History PMH of vertigo, GERD, macular degeneration, significant degenerative changes and anteriorlistesis    Diagnostic tests MRI imaging shows Moderate levoscoliosis of lumbar spine is noted. Grade 1  anterolisthesis of L4-5 is noted secondary to posterior facet joint  hypertrophy. Mild grade 1 retrolisthesis of L3-4 is noted secondary  to moderate degenerative disc disease at this level. Severe  degenerative disc disease is noted at L1-2 and L2-3 with anterior  osteophyte formation. No fracture is noted.  IMPRESSION:  Multilevel degenerative disc disease.    Patient Stated Goals To improve balance; to help be less stiff with back pain    Currently in Pain? No/denies                  [facility] Adult PT Treatment/Exercise - 01/15/21 1407      Transfers   Transfers Sit to Stand;Stand to Sit      Exercises   Exercises Other Exercises    Other Exercises  for hip strengthening:: seated with red  band around knees, band pulled tight for sit<>stands for 10 reps with cues to keep band pulled tight, min guard assist to stand, min guard to min assist to slowly sit back down.      Lumbar Exercises: Supine   Pelvic Tilt 10 reps;5 seconds;Limitations    Pelvic Tilt Limitations posterior pelvic tilt with abdominal bracing    Bridge Non-compliant;10 reps;Limitations    Bridge Limitations with arms at sides: bil LE's standard bridge for 10 reps, cues for abdominal bracing    Bridge with [employer] Non-compliant;10 reps;Limitations    Bridge with [employer] Limitations with yoga block squeeze, cues for abdominal bracing with arms at sides    Bridge with clamshell Non-compliant;10 reps;Limitations    Bridge with [employer] Limitations with red band resistance with cues for  slow, controlled movements. stability assistance needed at lateral sides of pelvis to prevent rocking.    Other Supine Lumbar Exercises Hooklying with red band around knees- clamshell for 10 reps with cues for slow, controlled movements.               Balance Exercises - 01/15/21 0001      Balance Exercises: Standing   Rockerboard Anterior/posterior;Lateral    Rockerboard Limitations performed both ways on balance board with no UE support, occasional touch to walls/chair for balance: rocking the board with emphasis on tall posture and use of ankes vs hips with EO, progressing to EC, min to mod  assist for balance (increased assistance needed with EC); then holding the boad steady for EC 30 sec's x 3 reps with min guard assist.    Tandem Gait Forward;Upper extremity support;Foam/compliant surface;2 reps;Limitations    Tandem Gait Limitations on blue foam beam: tandem gait with heel taps to floor between each step forward for 2 laps. cues on posture and step placement with UE support on counter for balance.    Sidestepping Foam/compliant support;3 reps;Limitations    Sidestepping Limitations on blue foam beam with occasional touch to counter for balance, cues on posture, step length and to keep toes pointed forward. min guard assist for balance/safety.               PT Short Term Goals - 01/06/21 1903      PT SHORT TERM GOAL #1   Title Pt will be independent with HEP for improved strength, balance, transfers, and gait.  TARGET 01/22/2021    Time 4    Period Weeks    Status On-going      PT SHORT TERM GOAL #2   Title Pt will improve 5x sit<>stand to less than or equal to 13 sec to demonstrate improved functional strength and transfer efficiency.    Baseline 16.75 sec    Time 4    Period Weeks    Status On-going      PT SHORT TERM GOAL #3   Title Pt will improve FGA score to at least 18/30 to decrease fall risk.    Baseline 13/30    Time 4    Status On-going      PT SHORT  TERM GOAL #4   Title Pt will be able to stand with Eyes closed on compliant surface for at least 15 seconds to demo improved vestibular system function for balance.    Baseline 7 sec    Time 4    Period Weeks    Status On-going      PT SHORT TERM GOAL #5   Title Pt will verbalize understanding of  fall prevention in home environment.    Time 4    Period Weeks    Status On-going      Additional Short Term Goals   Additional Short Term Goals Yes      PT SHORT TERM GOAL #6   Title Pt will verbalize/demo understanding of posture/positioning to decreased low back pain with functional mobility.  TARGET 01/22/2021    Time 2    Period Weeks    Status New             PT Long Term Goals - 01/08/21 1153      PT LONG TERM GOAL #1   Title Pt will be independent with progression of HEP for improved strength, back pain, balance, transfers, and gait.  TARGET  for all LTGs 02/19/2021    Time 8    Period Weeks    Status Revised      PT LONG TERM GOAL #2   Title Pt will improve FGA score to at least 22/30 to decrease fall risk.    Time 8    Period Weeks    Status On-going      PT LONG TERM GOAL #3   Title Pt will stand eyes closed on compliant surface x 30 seconds to demo improved vestibular system use for balance.    Time 8    Period Weeks    Status On-going      PT LONG TERM GOAL #4   Title Pt will ambulate at least 1000 ft indoor and outdoor surfaces, using appropriate assistive device, mod I and no LOB, for improved community gait.    Time 8    Period Weeks    Status On-going      PT LONG TERM GOAL #5   Title Pt will improve SOT composite score to at least 50 for improved overall balance.    Time 6    Period Weeks    Status New      PT LONG TERM GOAL #6   Title Oswestry Back pain questionaire to improve to less than or equal to 15% disability, to demo improved overall back pain with functional activities.    Time 6    Period Weeks    Status Revised      PT LONG TERM  GOAL #7   Title Pt will report at least 50% improvement in back pain during morning hours, to improve overall functional mobility with decreased pain.    Baseline reports 8-9/10    Time 6    Period Weeks    Status New                 Plan - 01/15/21 1406    Clinical Impression Statement Today's skilled session continued to focus on core/proximal hip strengthening and balance training on compliant surfaces with no issues noted or reported in session. The pt is making steady progress toward goals and should benefit from continued PT to progress toward unmet goals.    Personal Factors and Comorbidities Comorbidity 3+    Comorbidities high cholesterol, thyroid, anxiety, fractures R hand and ribs    Examination-Activity Limitations Locomotion Level;Transfers;Stand;Caring for Others    Examination-Participation Restrictions Community Activity    Stability/Clinical Decision Making Evolving/Moderate complexity    Rehab Potential Good    PT Frequency 3x / week    PT Duration 8 weeks   3x/wk to start week of [id number] per recert [id number]   PT Treatment/Interventions ADLs/Self Care Home Management;Gait training;Stair training;Functional mobility training;Therapeutic  activities;Therapeutic exercise;Balance training;DME Instruction;Neuromuscular re-education;Patient/family education;Vestibular;Moist Heat;Traction;Ultrasound;Electrical Stimulation;Manual techniques    PT Next Visit Plan FOR BACK:  update HEP for flexibility/stability; review posture and positioning education (including getting out of bed, in/out of car), manual traction, lumbar flexibility, lumbar/abdominal stability; update to [facility] and to MD for recert (was completed 09/06/1973 visit); add appts as needed (should be being seend 3x/wk starting wk of 5/4).    FOR BALANCE:  Continue to work on balance strategies, varied surfaces, vision removed for balance/vestibular system retraining, core and proximal hip strengthening to improve balance  with single limb stance, stepping up/down curbs/stairs, turning    Consulted and Agree with Plan of Care Patient           Patient will benefit from skilled therapeutic intervention in order to improve the following deficits and impairments:  Abnormal gait,Difficulty walking,Decreased balance,Decreased mobility,Decreased strength  Visit Diagnosis: Unsteadiness on feet  Other abnormalities of gait and mobility  Muscle weakness (generalized)  Chronic right-sided low back pain without sciatica  Abnormal posture     Problem List Patient Active Problem List   Diagnosis Date Noted  . Acute left-sided low back pain without sciatica 08/06/2018  . Anxiety 07/11/2017  . Hypothyroidism 02/07/2015  . Arthralgia of multiple joints 05/28/2014  . Vitamin D deficiency 05/09/2012  . Osteopenia 05/26/2010  . Hypersomnia 02/22/2010  . Hyperlipidemia 01/21/2010  . Diverticulosis of large intestine 01/21/2010  . 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 SKIN CANCER, HX OF 01/21/2010    Willow Ora, PTA, [facility] [address] [place], [place] [address] [phone] 01/15/21, 3:44 PM   Name: Audrey [name] MRN: [id number] Date of Birth: 07-Jun-1946

## 2021-01-19 ENCOUNTER — Ambulatory Visit: Payer: Medicare PPO | Admitting: Physical Therapy

## 2021-01-19 ENCOUNTER — Encounter: Payer: Self-pay | Admitting: Physical Therapy

## 2021-01-19 ENCOUNTER — Other Ambulatory Visit: Payer: Self-pay

## 2021-01-19 DIAGNOSIS — R293 Abnormal posture: Secondary | ICD-10-CM | POA: Diagnosis not present

## 2021-01-19 DIAGNOSIS — M6281 Muscle weakness (generalized): Secondary | ICD-10-CM

## 2021-01-19 DIAGNOSIS — G8929 Other chronic pain: Secondary | ICD-10-CM | POA: Diagnosis not present

## 2021-01-19 DIAGNOSIS — M545 Low back pain, unspecified: Secondary | ICD-10-CM | POA: Diagnosis not present

## 2021-01-19 DIAGNOSIS — R2681 Unsteadiness on feet: Secondary | ICD-10-CM

## 2021-01-19 DIAGNOSIS — R2689 Other abnormalities of gait and mobility: Secondary | ICD-10-CM | POA: Diagnosis not present

## 2021-01-19 NOTE — Therapy (Signed)
[facility] [address] [place], [place], [address] Phone: [phone]   Fax:  [phone]  Physical Therapy Treatment  Patient Details  Name: Audrey [name] MRN: [id number] Date of Birth: May 25, 1946 Referring Provider (PT): Hal Hope Tamala Julian); cc'd to Cleta Alberts, [facility]   Encounter Date: 01/19/2021   PT End of Session - 01/19/21 1328    Visit Number 9    Number of Visits 24   per recert to include lumbar pain   Date for PT Re-Evaluation [id number]   90 day cert for 8 wk POC   Authorization Type [employer]-submitted upon completion of eval    Authorization Time Period 24 visits from 4/21 - 6/3 - updated    Authorization - Visit Number 8    Authorization - Number of Visits 24    Progress Note Due on Visit 10    PT Start Time 1320    PT Stop Time 1400    PT Time Calculation (min) 40 min    Equipment Utilized During Treatment Gait belt    Activity Tolerance Patient tolerated treatment well    Behavior During Therapy [facility] for tasks assessed/performed           Past Medical History:  Diagnosis Date  . Allergy    seasonal allegies; seem worse this year  . Arthritis   . Cancer ([place])    basal cell cancer on face removed; Dr Ronnald Ramp  . Fracture    right 4th and 5th metacarpal  . GERD (gastroesophageal reflux disease)   . Hearing aid worn    B/L  . HOH (hard of hearing)   . Hyperlipidemia   . Hypothyroidism   . Osteopenia after menopause   . Pneumonia   . Wears glasses     Past Surgical History:  Procedure Laterality Date  . COLONOSCOPY  2012   neg; [facility]  . HEMORRHOID SURGERY    . OPEN REDUCTION INTERNAL FIXATION (ORIF) METACARPAL Right 04/10/2020   Procedure: OPEN REDUCTION INTERNAL FIXATION (ORIF)  AS NECESSARY RIGHT FOURTH AND FIFTH METACARPAL FRACTURES;  Surgeon: [name], MD;  Location: [place];  Service: Orthopedics;  Laterality: Right;  OPEN REDUCTION INTERNAL FIXATION (ORIF)  AS NECESSARY  RIGHT FOURTH AND FIFTH METACARPAL FRACTURES    There were no vitals filed for this visit.   Subjective Assessment - 01/19/21 1326    Subjective Doing good except for when she is sleeping at night. Is a stomach. Asking about a brace.    Pertinent History PMH of vertigo, GERD, macular degeneration, significant degenerative changes and anteriorlistesis    Diagnostic tests MRI imaging shows Moderate levoscoliosis of lumbar spine is noted. Grade 1  anterolisthesis of L4-5 is noted secondary to posterior facet joint  hypertrophy. Mild grade 1 retrolisthesis of L3-4 is noted secondary  to moderate degenerative disc disease at this level. Severe  degenerative disc disease is noted at L1-2 and L2-3 with anterior  osteophyte formation. No fracture is noted.  IMPRESSION:  Multilevel degenerative disc disease.    Patient Stated Goals To improve balance; to help be less stiff with back pain    Currently in Pain? No/denies                 [facility] Adult PT Treatment/Exercise - 01/19/21 1328      Transfers   Transfers Sit to Stand;Stand to Sit    Sit to Stand 6: Modified independent (Device/Increase time)    Stand to Sit 6: Modified independent (  Device/Increase time);To chair/3-in-1      Ambulation/Gait   Ambulation/Gait Yes    Ambulation/Gait Assistance 4: Min guard   with outdoor gait only, supervision to Mod I indoors   Ambulation/Gait Assistance Details with gait outdoors- minor veering/staggering noted on surface transitions, increased when scanning enviroment with no balance loss noted. Min guard assist for safety.    Ambulation Distance (Feet) 600 Feet   x1 in/outdoors   Assistive device None    Gait Pattern Step-through pattern;Wide base of support    Ambulation Surface Level;Unlevel;Indoor;Outdoor;Paved;Grass;Other (comment)   pine needles     Self-Care   Self-Care Other Self-Care Comments    Other Self-Care Comments  Patient asking about wearing a back brace with sleeping and for the  times when she will be very active, such as with cooking Thanksgiving dinner. Discussed that wearing a brace does not allow muscles to work efficiently and can actually make them weaker. Discussed use of a body pillow with sleeping as pt is a stomach sleeper and this will allow her to lie in partial prone position while supported with good spine alignment. Pictures from [place] shown for better understanding. Also discussed use of a wooden stool in kitchen to allow her to fully or partially sit at times while cooking to offload her back/hips/legs for decreased pain. Pt verbalized understanding of above.      Lumbar Exercises: Stretches   Single Knee to Chest Stretch Right;Left;2 reps;30 seconds;Limitations    Single Knee to Chest Stretch Limitations cues for hold times.    Other Lumbar Stretch Exercise seated hamstring stretch fo r30 seconds x 3 each side.      Lumbar Exercises: Supine   Pelvic Tilt 10 reps;5 seconds;Limitations    Pelvic Tilt Limitations posterior pelvic tilt with abdominal bracing    Bridge Non-compliant;10 reps;Limitations    Bridge Limitations with arms at sides: bil LE's standard bridge for 10 reps, cues for abdominal bracing               Balance Exercises - 01/19/21 1346      Balance Exercises: Standing   Standing Eyes Closed Narrow base of support (BOS)    Standing Eyes Closed Limitations on airex with no UE support: feet together for EC 30 sec's x 3 reps, progressing to feet apart for EC head movements left<>right, up<>down for ~10 reps each with min guard to min assist for balance. cues on posture and weight shifting to assist with balance recovery.    Partial Tandem Stance Eyes closed;Foam/compliant surface;2 reps;20 secs;Limitations    Partial Tandem Stance Limitations on airex for 2 reps each foot foward, cues on posture and weight shifting to assist with balance recovery.             PT Education - 01/19/21 1427    Education Details ways to potentially  decrease her back pain with sleeping/prolonged standing in kitchen vs use of back brace    Person(s) Educated Patient    Methods Explanation;Demonstration;Verbal cues;Handout    Comprehension Verbalized understanding;Returned demonstration;Verbal cues required;Need further instruction            PT Short Term Goals - 01/06/21 1903      PT SHORT TERM GOAL #1   Title Pt will be independent with HEP for improved strength, balance, transfers, and gait.  TARGET 01/22/2021    Time 4    Period Weeks    Status On-going      PT SHORT TERM GOAL #2   Title Pt  will improve 5x sit<>stand to less than or equal to 13 sec to demonstrate improved functional strength and transfer efficiency.    Baseline 16.75 sec    Time 4    Period Weeks    Status On-going      PT SHORT TERM GOAL #3   Title Pt will improve FGA score to at least 18/30 to decrease fall risk.    Baseline 13/30    Time 4    Status On-going      PT SHORT TERM GOAL #4   Title Pt will be able to stand with Eyes closed on compliant surface for at least 15 seconds to demo improved vestibular system function for balance.    Baseline 7 sec    Time 4    Period Weeks    Status On-going      PT SHORT TERM GOAL #5   Title Pt will verbalize understanding of fall prevention in home environment.    Time 4    Period Weeks    Status On-going      Additional Short Term Goals   Additional Short Term Goals Yes      PT SHORT TERM GOAL #6   Title Pt will verbalize/demo understanding of posture/positioning to decreased low back pain with functional mobility.  TARGET 01/22/2021    Time 2    Period Weeks    Status New             PT Long Term Goals - 01/08/21 1153      PT LONG TERM GOAL #1   Title Pt will be independent with progression of HEP for improved strength, back pain, balance, transfers, and gait.  TARGET  for all LTGs 02/19/2021    Time 8    Period Weeks    Status Revised      PT LONG TERM GOAL #2   Title Pt will improve  FGA score to at least 22/30 to decrease fall risk.    Time 8    Period Weeks    Status On-going      PT LONG TERM GOAL #3   Title Pt will stand eyes closed on compliant surface x 30 seconds to demo improved vestibular system use for balance.    Time 8    Period Weeks    Status On-going      PT LONG TERM GOAL #4   Title Pt will ambulate at least 1000 ft indoor and outdoor surfaces, using appropriate assistive device, mod I and no LOB, for improved community gait.    Time 8    Period Weeks    Status On-going      PT LONG TERM GOAL #5   Title Pt will improve SOT composite score to at least 50 for improved overall balance.    Time 6    Period Weeks    Status New      PT LONG TERM GOAL #6   Title Oswestry Back pain questionaire to improve to less than or equal to 15% disability, to demo improved overall back pain with functional activities.    Time 6    Period Weeks    Status Revised      PT LONG TERM GOAL #7   Title Pt will report at least 50% improvement in back pain during morning hours, to improve overall functional mobility with decreased pain.    Baseline reports 8-9/10    Time 6    Period Weeks    Status New  Plan - 01/19/21 1328    Clinical Impression Statement Today's skilled session continued to focus on strengthening/stretching, gait on uneven surfaces and balance on complaint surfaces. Up to min guard assist for safety with gait outdoors due to veering/staggering at times. Pt continues to need min guard to min assist for balance on compliant surface with vision removed. Pt with less episodes of touching walls/chair for balance. The pt is progressing toward goals and should benefit from continued PT to progress toward unmet goals.    Personal Factors and Comorbidities Comorbidity 3+    Comorbidities high cholesterol, thyroid, anxiety, fractures R hand and ribs    Examination-Activity Limitations Locomotion Level;Transfers;Stand;Caring for Others     Examination-Participation Restrictions Community Activity    Stability/Clinical Decision Making Evolving/Moderate complexity    Rehab Potential Good    PT Frequency 3x / week    PT Duration 8 weeks   3x/wk to start week of 03/13/8920 per recert 09/13/4172   PT Treatment/Interventions ADLs/Self Care Home Management;Gait training;Stair training;Functional mobility training;Therapeutic activities;Therapeutic exercise;Balance training;DME Instruction;Neuromuscular re-education;Patient/family education;Vestibular;Moist Heat;Traction;Ultrasound;Electrical Stimulation;Manual techniques    PT Next Visit Plan 10th visit progress note due. FOR BACK:  update HEP for flexibility/stability; review posture and positioning education (including getting out of bed, in/out of car), manual traction, lumbar flexibility, lumbar/abdominal stability; update to [facility] and to MD for recert (was completed 0/04/1447 visit); add appts as needed (should be being seend 3x/wk starting wk of 5/4).    FOR BALANCE:  Continue to work on balance strategies, varied surfaces, vision removed for balance/vestibular system retraining, core and proximal hip strengthening to improve balance with single limb stance, stepping up/down curbs/stairs, turning    Consulted and Agree with Plan of Care Patient           Patient will benefit from skilled therapeutic intervention in order to improve the following deficits and impairments:  Abnormal gait,Difficulty walking,Decreased balance,Decreased mobility,Decreased strength  Visit Diagnosis: Unsteadiness on feet  Other abnormalities of gait and mobility  Muscle weakness (generalized)     Problem List Patient Active Problem List   Diagnosis Date Noted  . Acute left-sided low back pain without sciatica 08/06/2018  . Anxiety 07/11/2017  . Hypothyroidism 02/07/2015  . Arthralgia of multiple joints 05/28/2014  . Vitamin D deficiency 05/09/2012  . Osteopenia 05/26/2010  . Hypersomnia  02/22/2010  . Hyperlipidemia 01/21/2010  . Diverticulosis of large intestine 01/21/2010  . 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 SKIN CANCER, HX OF 01/21/2010    Willow Ora, PTA, [facility] [address], [place], [place] [address] [phone] 01/19/21, 2:28 PM   Name: SANTANNA WHITFORD MRN: [id number] Date of Birth: 1945-09-13

## 2021-01-20 ENCOUNTER — Ambulatory Visit: Payer: Medicare PPO | Admitting: Physical Therapy

## 2021-01-20 ENCOUNTER — Encounter: Payer: Self-pay | Admitting: Physical Therapy

## 2021-01-20 DIAGNOSIS — R293 Abnormal posture: Secondary | ICD-10-CM | POA: Diagnosis not present

## 2021-01-20 DIAGNOSIS — F419 Anxiety disorder, unspecified: Secondary | ICD-10-CM | POA: Diagnosis not present

## 2021-01-20 DIAGNOSIS — M6281 Muscle weakness (generalized): Secondary | ICD-10-CM

## 2021-01-20 DIAGNOSIS — R21 Rash and other nonspecific skin eruption: Secondary | ICD-10-CM | POA: Diagnosis not present

## 2021-01-20 DIAGNOSIS — K219 Gastro-esophageal reflux disease without esophagitis: Secondary | ICD-10-CM | POA: Diagnosis not present

## 2021-01-20 DIAGNOSIS — M545 Low back pain, unspecified: Secondary | ICD-10-CM | POA: Diagnosis not present

## 2021-01-20 DIAGNOSIS — G8929 Other chronic pain: Secondary | ICD-10-CM | POA: Diagnosis not present

## 2021-01-20 DIAGNOSIS — R2681 Unsteadiness on feet: Secondary | ICD-10-CM

## 2021-01-20 DIAGNOSIS — E039 Hypothyroidism, unspecified: Secondary | ICD-10-CM | POA: Diagnosis not present

## 2021-01-20 DIAGNOSIS — R2689 Other abnormalities of gait and mobility: Secondary | ICD-10-CM | POA: Diagnosis not present

## 2021-01-20 DIAGNOSIS — E7800 Pure hypercholesterolemia, unspecified: Secondary | ICD-10-CM | POA: Diagnosis not present

## 2021-01-20 NOTE — Therapy (Signed)
[facility] [address] [place], [place], [address] Phone: [phone]   Fax:  [phone]  Physical Therapy Treatment  Patient Details  Name: Audrey [name] MRN: [id number] Date of Birth: [birth year] Referring Provider (PT): Hal Hope Tamala Julian); cc'd to Cleta Alberts, [facility]   Encounter Date: 01/20/2021   PT End of Session - 01/20/21 1445    Visit Number 10    Number of Visits 24   per recert to include lumbar pain   Date for PT Re-Evaluation 99/35/70   90 day cert for 8 wk POC   Authorization Type [employer]-submitted upon completion of eval    Authorization Time Period 24 visits from 4/21 - 6/3 - updated    Authorization - Visit Number 9    Authorization - Number of Visits 24    Progress Note Due on Visit 10    PT Start Time 1779    PT Stop Time 1529    PT Time Calculation (min) 44 min    Equipment Utilized During Treatment Gait belt    Activity Tolerance Patient tolerated treatment well    Behavior During Therapy [facility] for tasks assessed/performed           Past Medical History:  Diagnosis Date  . Allergy    seasonal allegies; seem worse this year  . Arthritis   . Cancer ([place])    basal cell cancer on face removed; Dr Ronnald Ramp  . Fracture    right 4th and 5th metacarpal  . GERD (gastroesophageal reflux disease)   . Hearing aid worn    B/L  . HOH (hard of hearing)   . Hyperlipidemia   . Hypothyroidism   . Osteopenia after menopause   . Pneumonia   . Wears glasses     Past Surgical History:  Procedure Laterality Date  . COLONOSCOPY  2012   neg; [facility]  . HEMORRHOID SURGERY    . OPEN REDUCTION INTERNAL FIXATION (ORIF) METACARPAL Right 04/10/2020   Procedure: OPEN REDUCTION INTERNAL FIXATION (ORIF)  AS NECESSARY RIGHT FOURTH AND FIFTH METACARPAL FRACTURES;  Surgeon: [name], MD;  Location: [facility];  Service: Orthopedics;  Laterality: Right;  OPEN REDUCTION INTERNAL FIXATION (ORIF)  AS NECESSARY  RIGHT FOURTH AND FIFTH METACARPAL FRACTURES    There were no vitals filed for this visit.   Subjective Assessment - 01/20/21 1445    Subjective Doing okay.  Excited about the possibility of the body pillow.  Just a little soreness and stiffness a little more so today.    Pertinent History PMH of vertigo, GERD, macular degeneration, significant degenerative changes and anteriorlistesis    Diagnostic tests MRI imaging shows Moderate levoscoliosis of lumbar spine is noted. Grade 1  anterolisthesis of L4-5 is noted secondary to posterior facet joint  hypertrophy. Mild grade 1 retrolisthesis of L3-4 is noted secondary  to moderate degenerative disc disease at this level. Severe  degenerative disc disease is noted at L1-2 and L2-3 with anterior  osteophyte formation. No fracture is noted.  IMPRESSION:  Multilevel degenerative disc disease.    Patient Stated Goals To improve balance; to help be less stiff with back pain    Currently in Pain? Yes    Pain Score 3     Pain Location Back    Pain Orientation Lower    Pain Descriptors / Indicators --   Stiffness   Aggravating Factors  stiffness sometimes worse in the morning.    Pain Relieving Factors stretches, exercises  [facility] PT Assessment - 01/20/21 0001      Functional Gait  Assessment   Gait assessed  Yes    Gait Level Surface Walks 20 ft in less than 7 sec but greater than 5.5 sec, uses assistive device, slower speed, mild gait deviations, or deviates 6-10 in outside of the 12 in walkway width.   6.94   Change in Gait Speed Able to change speed, demonstrates mild gait deviations, deviates 6-10 in outside of the 12 in walkway width, or no gait deviations, unable to achieve a major change in velocity, or uses a change in velocity, or uses an assistive device.    Gait with Horizontal Head Turns Performs head turns smoothly with slight change in gait velocity (eg, minor disruption to smooth gait path), deviates 6-10 in outside 12 in  walkway width, or uses an assistive device.   7.87   Gait with Vertical Head Turns Performs task with slight change in gait velocity (eg, minor disruption to smooth gait path), deviates 6 - 10 in outside 12 in walkway width or uses assistive device   7 sec   Gait and Pivot Turn Pivot turns safely within 3 sec and stops quickly with no loss of balance.    Step Over Obstacle Is able to step over one shoe box (4.5 in total height) but must slow down and adjust steps to clear box safely. May require verbal cueing.    Gait with Narrow Base of Support Ambulates less than 4 steps heel to toe or cannot perform without assistance.    Gait with Eyes Closed Cannot walk 20 ft without assistance, severe gait deviations or imbalance, deviates greater than 15 in outside 12 in walkway width or will not attempt task.    Ambulating Backwards Walks 20 ft, uses assistive device, slower speed, mild gait deviations, deviates 6-10 in outside 12 in walkway width.   17.87   Steps Alternating feet, must use rail.    Total Score 16                  Standing on foam:  EC no head motions, 2 reps 15 sec with close supervision.       [facility] PT Treatment/Exercise - 01/20/21 0001      Transfers   Transfers Sit to Stand;Stand to Sit    Sit to Stand 6: Modified independent (Device/Increase time);Without upper extremity assist;From chair/3-in-1    Five time sit to stand comments  14.5    Stand to Sit 6: Modified independent (Device/Increase time);To chair/3-in-1;With upper extremity assist      High Level Balance   High Level Balance Comments On blue mat in parallel bars:  forward/back walking x 3 reps, cues (visual and verbal) to widen BOS for improved stability.      Exercises   Exercises Knee/Hip      Knee/Hip Exercises: Standing   Lateral Step Up Right;Left;1 set;10 reps;Hand Hold: 1;Step Height: 4"    Forward Step Up Left;Right;1 set;10 reps;Hand Hold: 2;Step Height: 6"    Step Down Right;Left;1  set;5 reps;Hand Hold: 1;Step Height: 2"   from blue mat surface   Other Standing Knee Exercises Single limb stance for hip stability, step taps to 6" step x 10 reps with no UE support.  2nd set each leg with cues for upright activation through gluts on stance leg.    Other Standing Knee Exercises Single limb stance with foot propped on 6" step, alt UE lifts x 5 reps each.  PT Education - 01/20/21 1622    Education Details Try standing with wider BOS for better balance and to allow for more weightshifting; discussed using positioning, exercise, standing stretching    Person(s) Educated Patient    Methods Explanation    Comprehension Verbalized understanding            PT Short Term Goals - 01/20/21 1500      PT SHORT TERM GOAL #1   Title Pt will be independent with HEP for improved strength, balance, transfers, and gait.  TARGET 01/22/2021    Time 4    Period Weeks    Status On-going      PT SHORT TERM GOAL #2   Title Pt will improve 5x sit<>stand to less than or equal to 13 sec to demonstrate improved functional strength and transfer efficiency.    Baseline 16.75 sec; 14.5 sec    Time 4    Period Weeks    Status Not Met      PT SHORT TERM GOAL #3   Title Pt will improve FGA score to at least 18/30 to decrease fall risk.    Baseline 13/30; 16/30    Time 4    Status Not Met      PT SHORT TERM GOAL #4   Title Pt will be able to stand with Eyes closed on compliant surface for at least 15 seconds to demo improved vestibular system function for balance.    Baseline 7 sec; 15 sec 2 trials    Time 4    Period Weeks    Status Achieved      PT SHORT TERM GOAL #5   Title Pt will verbalize understanding of fall prevention in home environment.    Time 4    Period Weeks    Status On-going      PT SHORT TERM GOAL #6   Title Pt will verbalize/demo understanding of posture/positioning to decreased low back pain with functional mobility.  TARGET 01/22/2021     Time 2    Period Weeks    Status New             PT Long Term Goals - 01/08/21 1153      PT LONG TERM GOAL #1   Title Pt will be independent with progression of HEP for improved strength, back pain, balance, transfers, and gait.  TARGET  for all LTGs 02/19/2021    Time 8    Period Weeks    Status Revised      PT LONG TERM GOAL #2   Title Pt will improve FGA score to at least 22/30 to decrease fall risk.    Time 8    Period Weeks    Status On-going      PT LONG TERM GOAL #3   Title Pt will stand eyes closed on compliant surface x 30 seconds to demo improved vestibular system use for balance.    Time 8    Period Weeks    Status On-going      PT LONG TERM GOAL #4   Title Pt will ambulate at least 1000 ft indoor and outdoor surfaces, using appropriate assistive device, mod I and no LOB, for improved community gait.    Time 8    Period Weeks    Status On-going      PT LONG TERM GOAL #5   Title Pt will improve SOT composite score to at least 50 for improved overall balance.    Time 6  Period Weeks    Status New      PT LONG TERM GOAL #6   Title Oswestry Back pain questionaire to improve to less than or equal to 15% disability, to demo improved overall back pain with functional activities.    Time 6    Period Weeks    Status Revised      PT LONG TERM GOAL #7   Title Pt will report at least 50% improvement in back pain during morning hours, to improve overall functional mobility with decreased pain.    Baseline reports 8-9/10    Time 6    Period Weeks    Status New                 Plan - 01/20/21 1626    Clinical Impression Statement 10th Visit Progress Note, covering dates 12/24/2020-01/20/2021.  Subjective reports:  no falls, some stiffness associated with her back.  Objective measures:  FGA 16/30 (improved from 13/30) and 5x sit<>stand 14.5 sec (improved from 16.75 sec).  Pt has improved on ability to stand on foam with eyes closed, 15 sec (2 reps).  Pt  completed Sensory Organization test several weeks ago, with decreased composite balance score and decreased vestibular system use noted for balance.  Began checking STGs this visit, with pt not meeting STG 2, 3, and met STG 4.  Pt demonstrates decreased hip stability in single limb stance and narrowed BOS.  She remains at fall risk and will continue to benefit from skilled PT to further address balance, strength, posture and gait.  Also address back pain and stiffness, with pt having been educated in positioning, posture and back exercises.  She is incorporating this education into her routine.  Plan to progress towards remaining STGs and LTGs for improved overall functional mobility and decreased fall risk.    Personal Factors and Comorbidities Comorbidity 3+    Comorbidities high cholesterol, thyroid, anxiety, fractures R hand and ribs    Examination-Activity Limitations Locomotion Level;Transfers;Stand;Caring for Others    Examination-Participation Restrictions Community Activity    Stability/Clinical Decision Making Evolving/Moderate complexity    Rehab Potential Good    PT Frequency 3x / week    PT Duration 8 weeks   3x/wk to start week of 0/0/4599 per recert 03/11/4141   PT Treatment/Interventions ADLs/Self Care Home Management;Gait training;Stair training;Functional mobility training;Therapeutic activities;Therapeutic exercise;Balance training;DME Instruction;Neuromuscular re-education;Patient/family education;Vestibular;Moist Heat;Traction;Ultrasound;Electrical Stimulation;Manual techniques    PT Next Visit Plan Check remaining STGs; FOR BACK:  update HEP for flexibility/stability; review posture and positioning education (including getting out of bed, in/out of car), manual traction, lumbar flexibility, lumbar/abdominal stability; update to [facility] and to MD for recert (was completed 11/12/5318 visit); add appts as needed (should be being seend 3x/wk starting wk of 5/4).    FOR BALANCE:  Continue to  work on balance strategies, varied surfaces, vision removed for balance/vestibular system retraining, core and proximal hip strengthening to improve balance with single limb stance, stepping up/down curbs/stairs, turning    Consulted and Agree with Plan of Care Patient           Patient will benefit from skilled therapeutic intervention in order to improve the following deficits and impairments:  Abnormal gait,Difficulty walking,Decreased balance,Decreased mobility,Decreased strength  Visit Diagnosis: Unsteadiness on feet  Muscle weakness (generalized)     Problem List Patient Active Problem List   Diagnosis Date Noted  . Acute left-sided low back pain without sciatica 08/06/2018  . Anxiety 07/11/2017  . Hypothyroidism 02/07/2015  . Arthralgia  of multiple joints 05/28/2014  . Vitamin D deficiency 05/09/2012  . Osteopenia 05/26/2010  . Hypersomnia 02/22/2010  . Hyperlipidemia 01/21/2010  . Diverticulosis of large intestine 01/21/2010  . 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 SKIN CANCER, HX OF 01/21/2010    Audrey [name] W. 01/20/2021, 4:44 PM  Frazier Butt., PT   [facility] [address] [place], [place], [address] Phone: [phone]   Fax:  [phone]  Name: Audrey [name] MRN: [id number] Date of Birth: May 09, 1946

## 2021-01-22 ENCOUNTER — Ambulatory Visit: Payer: Medicare PPO | Admitting: Physical Therapy

## 2021-01-26 ENCOUNTER — Encounter: Payer: Self-pay | Admitting: Physical Therapy

## 2021-01-26 ENCOUNTER — Ambulatory Visit: Payer: Medicare PPO | Admitting: Physical Therapy

## 2021-01-26 ENCOUNTER — Other Ambulatory Visit: Payer: Self-pay

## 2021-01-26 DIAGNOSIS — R2689 Other abnormalities of gait and mobility: Secondary | ICD-10-CM | POA: Diagnosis not present

## 2021-01-26 DIAGNOSIS — R293 Abnormal posture: Secondary | ICD-10-CM | POA: Diagnosis not present

## 2021-01-26 DIAGNOSIS — M6281 Muscle weakness (generalized): Secondary | ICD-10-CM

## 2021-01-26 DIAGNOSIS — R2681 Unsteadiness on feet: Secondary | ICD-10-CM

## 2021-01-26 DIAGNOSIS — G8929 Other chronic pain: Secondary | ICD-10-CM | POA: Diagnosis not present

## 2021-01-26 DIAGNOSIS — M545 Low back pain, unspecified: Secondary | ICD-10-CM | POA: Diagnosis not present

## 2021-01-26 NOTE — Therapy (Signed)
[facility] [address] [place], [place], [address] Phone: [phone]   Fax:  [phone]  Physical Therapy Treatment  Patient Details  Name: Audrey [name] MRN: [id number] Date of Birth: 1945/12/26 Referring Provider (PT): Hal Hope Tamala Julian); cc'd to Cleta Alberts, [facility]   Encounter Date: 01/26/2021   PT End of Session - 01/26/21 0934    Visit Number 11    Number of Visits 24   per recert to include lumbar pain   Date for PT Re-Evaluation 09/98/33   90 day cert for 8 wk POC   Authorization Type [employer]-submitted upon completion of eval    Authorization Time Period 24 visits from 4/21 - 6/3 - updated    Authorization - Visit Number 10    Authorization - Number of Visits 24    Progress Note Due on Visit 20    PT Start Time 0935    PT Stop Time 1017    PT Time Calculation (min) 42 min    Equipment Utilized During Treatment Gait belt    Activity Tolerance Patient tolerated treatment well    Behavior During Therapy [facility] for tasks assessed/performed           Past Medical History:  Diagnosis Date  . Allergy    seasonal allegies; seem worse this year  . Arthritis   . Cancer ([place])    basal cell cancer on face removed; Dr Ronnald Ramp  . Fracture    right 4th and 5th metacarpal  . GERD (gastroesophageal reflux disease)   . Hearing aid worn    B/L  . HOH (hard of hearing)   . Hyperlipidemia   . Hypothyroidism   . Osteopenia after menopause   . Pneumonia   . Wears glasses     Past Surgical History:  Procedure Laterality Date  . COLONOSCOPY  2012   neg; [facility]  . HEMORRHOID SURGERY    . OPEN REDUCTION INTERNAL FIXATION (ORIF) METACARPAL Right 04/10/2020   Procedure: OPEN REDUCTION INTERNAL FIXATION (ORIF)  AS NECESSARY RIGHT FOURTH AND FIFTH METACARPAL FRACTURES;  Surgeon: [name], MD;  Location: [facility];  Service: Orthopedics;  Laterality: Right;  OPEN REDUCTION INTERNAL FIXATION (ORIF)  AS NECESSARY  RIGHT FOURTH AND FIFTH METACARPAL FRACTURES    There were no vitals filed for this visit.   Subjective Assessment - 01/26/21 0934    Subjective Went downtown and I just didn't do too well. Walked to [employer] from parking the car, and then walked back.  Feel so off balance, no confidence walking.  Been using the pillows at night and feel better when I first get up.    Pertinent History PMH of vertigo, GERD, macular degeneration, significant degenerative changes and anteriorlistesis    Diagnostic tests MRI imaging shows Moderate levoscoliosis of lumbar spine is noted. Grade 1  anterolisthesis of L4-5 is noted secondary to posterior facet joint  hypertrophy. Mild grade 1 retrolisthesis of L3-4 is noted secondary  to moderate degenerative disc disease at this level. Severe  degenerative disc disease is noted at L1-2 and L2-3 with anterior  osteophyte formation. No fracture is noted.  IMPRESSION:  Multilevel degenerative disc disease.    Patient Stated Goals To improve balance; to help be less stiff with back pain    Currently in Pain? No/denies                             [facility]  Adult PT Treatment/Exercise - 01/26/21 0001      Ambulation/Gait   Ambulation/Gait Yes    Ambulation/Gait Assistance 5: Supervision;4: Min guard    Ambulation/Gait Assistance Details Initial, then intermittent cueing for sequence of cane, slight intermittent lateral sway noted    Ambulation Distance (Feet) 800 Feet   indoors, mat and level surfaces, using cane for increased stability.   Assistive device None;Straight cane    Gait Pattern Step-through pattern;Wide base of support;Lateral trunk lean to right;Lateral trunk lean to left   Trendelenburg/lateral lean/trunk sway   Ambulation Surface Level;Unlevel;Indoor    Gait Comments Discussed that based on pt's continued lateral sway and lean to R or L, needing intermittent support and min guard/min assist of therapist, pt would benefit from trial of  cane.  Explained benefits of cane use for long distance/outdoor surfaces, but pt does not seem interested, reporting she does not want to get dependent on it.               Balance Exercises - 01/26/21 0001      Balance Exercises: Standing   Tandem Gait Forward;Retro;Upper extremity support;3 reps    Tandem Gait Limitations On solid surface, progressed to forward/back march x 3, then forward/back tandem march x 2 reps.  PT with consistent min guard, cues at hips/VCs to be tall through trunk/hips.  UE support    Other Standing Exercises On compliant mats, forward walking with head turns, 4 reps with increased lateral sway and LOB to R and L, leaning on counter for support    Other Standing Exercises Comments Monster walk forward, 4 reps on red/blue mats, UE support and min guard; then tandem gait on compliant mats with side taps with UE support/min guard.  Standing on incline/decline of ramp:  marching in place x 10, alt step taps forward x 10 reps, min guard (1 episode of R lateral lean reaching out to wall for balance on incline).  Stagger stance foot position with head turns x 5, head nods x 5.  Sidestepping up and down ramp with min assist, 2 reps each leg leading.  Pt with min LOB upon transition from ramp to floor.             PT Education - 01/26/21 1045    Education Details Benefits of using cane on outdoor surfaces, longer distances    Person(s) Educated Patient    Methods Explanation;Demonstration;Verbal cues    Comprehension Verbalized understanding;Returned demonstration;Verbal cues required;Need further instruction   Pt reports not wanting to be dependent on cane           PT Short Term Goals - 01/20/21 1500      PT SHORT TERM GOAL #1   Title Pt will be independent with HEP for improved strength, balance, transfers, and gait.  TARGET 01/22/2021    Time 4    Period Weeks    Status On-going      PT SHORT TERM GOAL #2   Title Pt will improve 5x sit<>stand to less than  or equal to 13 sec to demonstrate improved functional strength and transfer efficiency.    Baseline 16.75 sec; 14.5 sec    Time 4    Period Weeks    Status Not Met      PT SHORT TERM GOAL #3   Title Pt will improve FGA score to at least 18/30 to decrease fall risk.    Baseline 13/30; 16/30    Time 4    Status Not Met  PT SHORT TERM GOAL #4   Title Pt will be able to stand with Eyes closed on compliant surface for at least 15 seconds to demo improved vestibular system function for balance.    Baseline 7 sec; 15 sec 2 trials    Time 4    Period Weeks    Status Achieved      PT SHORT TERM GOAL #5   Title Pt will verbalize understanding of fall prevention in home environment.    Time 4    Period Weeks    Status On-going      PT SHORT TERM GOAL #6   Title Pt will verbalize/demo understanding of posture/positioning to decreased low back pain with functional mobility.  TARGET 01/22/2021    Time 2    Period Weeks    Status New             PT Long Term Goals - 01/08/21 1153      PT LONG TERM GOAL #1   Title Pt will be independent with progression of HEP for improved strength, back pain, balance, transfers, and gait.  TARGET  for all LTGs 02/19/2021    Time 8    Period Weeks    Status Revised      PT LONG TERM GOAL #2   Title Pt will improve FGA score to at least 22/30 to decrease fall risk.    Time 8    Period Weeks    Status On-going      PT LONG TERM GOAL #3   Title Pt will stand eyes closed on compliant surface x 30 seconds to demo improved vestibular system use for balance.    Time 8    Period Weeks    Status On-going      PT LONG TERM GOAL #4   Title Pt will ambulate at least 1000 ft indoor and outdoor surfaces, using appropriate assistive device, mod I and no LOB, for improved community gait.    Time 8    Period Weeks    Status On-going      PT LONG TERM GOAL #5   Title Pt will improve SOT composite score to at least 50 for improved overall balance.     Time 6    Period Weeks    Status New      PT LONG TERM GOAL #6   Title Oswestry Back pain questionaire to improve to less than or equal to 15% disability, to demo improved overall back pain with functional activities.    Time 6    Period Weeks    Status Revised      PT LONG TERM GOAL #7   Title Pt will report at least 50% improvement in back pain during morning hours, to improve overall functional mobility with decreased pain.    Baseline reports 8-9/10    Time 6    Period Weeks    Status New                 Plan - 01/26/21 1046    Clinical Impression Statement Focus of skilled PT session today on balance on solid>compliant>unlevel surfaces.  Pt has inconsistent LOB to either R or L, appearing to have [place]/trunk lean with narrow BOS and on compliant mat surfaces.  Pt often reaches out for support of counter/wall or leans against wall to regain stability.  Discussed benefits of and trialed use of cane today on indoor level/unlevel surfaces, with pt improving cane sequence with repetition.  With  cane, she still has some lateral sway and veering, but less compared to no cane.  She does not want to use a cane, due to not wanting to be dependent on it, but PT feels for longer, outdoor distances, cane would be beneficial to improve stability and confidence with gait.    Personal Factors and Comorbidities Comorbidity 3+    Comorbidities high cholesterol, thyroid, anxiety, fractures R hand and ribs    Examination-Activity Limitations Locomotion Level;Transfers;Stand;Caring for Others    Examination-Participation Restrictions Community Activity    Stability/Clinical Decision Making Evolving/Moderate complexity    Rehab Potential Good    PT Frequency 3x / week    PT Duration 8 weeks   3x/wk to start week of 04/12/5026 per recert 03/08/1286   PT Treatment/Interventions ADLs/Self Care Home Management;Gait training;Stair training;Functional mobility training;Therapeutic  activities;Therapeutic exercise;Balance training;DME Instruction;Neuromuscular re-education;Patient/family education;Vestibular;Moist Heat;Traction;Ultrasound;Electrical Stimulation;Manual techniques    PT Next Visit Plan Check remaining STGs !! (I forgot today as we focused mostly on balance); try gait again with cane (on outdoor surfaces if possible) FOR BACK:  update HEP for flexibility/stability; review posture and positioning education (including getting out of bed, in/out of car), manual traction, lumbar flexibility, lumbar/abdominal stability;  FOR BALANCE:  Continue to work on balance strategies, varied surfaces, vision removed for balance/vestibular system retraining, core and proximal hip strengthening to improve balance with single limb stance, stepping up/down curbs/stairs, turning    Consulted and Agree with Plan of Care Patient           Patient will benefit from skilled therapeutic intervention in order to improve the following deficits and impairments:  Abnormal gait,Difficulty walking,Decreased balance,Decreased mobility,Decreased strength  Visit Diagnosis: Other abnormalities of gait and mobility  Unsteadiness on feet  Muscle weakness (generalized)     Problem List Patient Active Problem List   Diagnosis Date Noted  . Acute left-sided low back pain without sciatica 08/06/2018  . Anxiety 07/11/2017  . Hypothyroidism 02/07/2015  . Arthralgia of multiple joints 05/28/2014  . Vitamin D deficiency 05/09/2012  . Osteopenia 05/26/2010  . Hypersomnia 02/22/2010  . Hyperlipidemia 01/21/2010  . Diverticulosis of large intestine 01/21/2010  . 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 SKIN CANCER, HX OF 01/21/2010    Frazier Butt. 01/26/2021, 10:51 AM Frazier Butt., PT  [facility] [address] [place], [place], [address] Phone: [phone]   Fax:  [phone]  Name: Audrey [name] MRN:  [id number] Date of Birth: 17-Nov-1945

## 2021-01-27 ENCOUNTER — Ambulatory Visit: Payer: Medicare PPO | Admitting: Physical Therapy

## 2021-01-27 ENCOUNTER — Encounter: Payer: Self-pay | Admitting: Physical Therapy

## 2021-01-27 DIAGNOSIS — R293 Abnormal posture: Secondary | ICD-10-CM | POA: Diagnosis not present

## 2021-01-27 DIAGNOSIS — M545 Low back pain, unspecified: Secondary | ICD-10-CM | POA: Diagnosis not present

## 2021-01-27 DIAGNOSIS — G8929 Other chronic pain: Secondary | ICD-10-CM | POA: Diagnosis not present

## 2021-01-27 DIAGNOSIS — R2681 Unsteadiness on feet: Secondary | ICD-10-CM | POA: Diagnosis not present

## 2021-01-27 DIAGNOSIS — R2689 Other abnormalities of gait and mobility: Secondary | ICD-10-CM

## 2021-01-27 DIAGNOSIS — M6281 Muscle weakness (generalized): Secondary | ICD-10-CM

## 2021-01-27 NOTE — Patient Instructions (Addendum)
Fall Prevention in the Home, Adult Falls can cause injuries and can affect people from all age groups. There are many simple things that you can do to make your home safe and to help prevent falls. Ask for help when making these changes, if needed. What actions can I take to prevent falls? General instructions  Use good lighting in all rooms. Replace any light bulbs that burn out.  Turn on lights if it is dark. Use night-lights.  Place frequently used items in easy-to-reach places. Lower the shelves around your home if necessary.  Set up furniture so that there are clear paths around it. Avoid moving your furniture around.  Remove throw rugs and other tripping hazards from the floor.  Avoid walking on wet floors.  Fix any uneven floor surfaces.  Add color or contrast paint or tape to grab bars and handrails in your home. Place contrasting color strips on the first and last steps of stairways.  When you use a stepladder, make sure that it is completely opened and that the sides are firmly locked. Have someone hold the ladder while you are using it. Do not climb a closed stepladder.  Be aware of any and all pets. What can I do in the bathroom?  Keep the floor dry. Immediately clean up any water that spills onto the floor.  Remove soap buildup in the tub or shower on a regular basis.  Use non-skid mats or decals on the floor of the tub or shower.  Attach bath mats securely with double-sided, non-slip rug tape.  If you need to sit down while you are in the shower, use a plastic, non-slip stool.  Install grab bars by the toilet and in the tub and shower. Do not use towel bars as grab bars.      What can I do in the bedroom?  Make sure that a bedside light is easy to reach.  Do not use oversized bedding that drapes onto the floor.  Have a firm chair that has side arms to use for getting dressed. What can I do in the kitchen?  Clean up any spills right away.  If you need to  reach for something above you, use a sturdy step stool that has a grab bar.  Keep electrical cables out of the way.  Do not use floor polish or wax that makes floors slippery. If you must use wax, make sure that it is non-skid floor wax. What can I do in the stairways?  Do not leave any items on the stairs.  Make sure that you have a light switch at the top of the stairs and the bottom of the stairs. Have them installed if you do not have them.  Make sure that there are handrails on both sides of the stairs. Fix handrails that are broken or loose. Make sure that handrails are as long as the stairways.  Install non-slip stair treads on all stairs in your home.  Avoid having throw rugs at the top or bottom of stairways, or secure the rugs with carpet tape to prevent them from moving.  Choose a carpet design that does not hide the edge of steps on the stairway.  Check any carpeting to make sure that it is firmly attached to the stairs. Fix any carpet that is loose or worn. What can I do on the outside of my home?  Use bright outdoor lighting.  Regularly repair the edges of walkways and driveways and fix any cracks.  Remove high doorway thresholds.  Trim any shrubbery on the main path into your home.  Regularly check that handrails are securely fastened and in good repair. Both sides of any steps should have handrails.  Install guardrails along the edges of any raised decks or porches.  Clear walkways of debris and clutter, including tools and rocks.  Have leaves, snow, and ice cleared regularly.  Use sand or salt on walkways during winter months.  In the garage, clean up any spills right away, including grease or oil spills. What other actions can I take?  Wear closed-toe shoes that fit well and support your feet. Wear shoes that have rubber soles or low heels.  Use mobility aids as needed, such as canes, walkers, scooters, and crutches.  Review your medicines with your  health care provider. Some medicines can cause dizziness or changes in blood pressure, which increase your risk of falling. Talk with your health care provider about other ways that you can decrease your risk of falls. This may include working with a physical therapist or trainer to improve your strength, balance, and endurance. Where to find more information  Centers for Disease Control and Prevention, STEADI: WebmailGuide.co.za  [employer] on Aging: BrainJudge.co.uk Contact a health care provider if:  You are afraid of falling at home.  You feel weak, drowsy, or dizzy at home.  You fall at home. Summary  There are many simple things that you can do to make your home safe and to help prevent falls.  Ways to make your home safe include removing tripping hazards and installing grab bars in the bathroom.  Ask for help when making these changes in your home. This information is not intended to replace advice given to you by your health care provider. Make sure you discuss any questions you have with your health care provider. Document Revised: 08/04/2017 Document Reviewed: 04/06/2017 Elsevier Patient Education  [place].  Access Code: [email or web address] URL: [email or web address] Date: 01/27/2021 Prepared by: Willow Ora  Exercises Supine Posterior Pelvic Tilt - 1 x daily - 7 x weekly - 1 sets - 5-10 reps Hooklying Single Knee to Chest Stretch - 1 x daily - 7 x weekly - 1 sets - 3 reps - 10 sec hold Supine Lower Trunk Rotation - 1 x daily - 7 x weekly - 1 sets - 5 reps - 10-15 sec hold Walking March - 1 x daily - 5 x weekly - 1 sets - 3 reps Tandem Walking with Counter Support - 1 x daily - 5 x weekly - 1 sets - 3 reps Standing Near Stance in Corner with Eyes Closed - 1 x daily - 5 x weekly - 1 sets - 3 reps - 30 hold Standing Balance in Corner with Eyes Closed - 1 x daily - 5 x weekly - 1 sets - 10 reps

## 2021-01-28 ENCOUNTER — Other Ambulatory Visit: Payer: Self-pay

## 2021-01-28 ENCOUNTER — Ambulatory Visit: Payer: Medicare PPO | Admitting: Physical Therapy

## 2021-01-28 ENCOUNTER — Encounter: Payer: Self-pay | Admitting: Physical Therapy

## 2021-01-28 DIAGNOSIS — R2689 Other abnormalities of gait and mobility: Secondary | ICD-10-CM | POA: Diagnosis not present

## 2021-01-28 DIAGNOSIS — G8929 Other chronic pain: Secondary | ICD-10-CM | POA: Diagnosis not present

## 2021-01-28 DIAGNOSIS — R2681 Unsteadiness on feet: Secondary | ICD-10-CM

## 2021-01-28 DIAGNOSIS — M545 Low back pain, unspecified: Secondary | ICD-10-CM | POA: Diagnosis not present

## 2021-01-28 DIAGNOSIS — M6281 Muscle weakness (generalized): Secondary | ICD-10-CM | POA: Diagnosis not present

## 2021-01-28 DIAGNOSIS — R293 Abnormal posture: Secondary | ICD-10-CM | POA: Diagnosis not present

## 2021-01-28 NOTE — Therapy (Signed)
[facility] [address] [place], [place], [address] Phone: [phone]   Fax:  [phone]  Physical Therapy Treatment  Patient Details  Name: Audrey [name] MRN: [id number] Date of Birth: Nov 07, 1945 Referring Provider (PT): Hal Hope Tamala Julian); cc'd to Cleta Alberts, [facility]   Encounter Date: 01/28/2021   PT End of Session - 01/28/21 0925    Visit Number 13    Number of Visits 24   per recert to include lumbar pain   Date for PT Re-Evaluation 68/37/29   90 day cert for 8 wk POC   Authorization Type [employer]-submitted upon completion of eval    Authorization Time Period 24 visits from 4/21 - 6/3 - updated    Authorization - Visit Number 12    Authorization - Number of Visits 24    Progress Note Due on Visit 20    PT Start Time 0928    PT Stop Time 1012    PT Time Calculation (min) 44 min    Equipment Utilized During Treatment Gait belt    Activity Tolerance Patient tolerated treatment well    Behavior During Therapy [facility] for tasks assessed/performed           Past Medical History:  Diagnosis Date  . Allergy    seasonal allegies; seem worse this year  . Arthritis   . Cancer ([place])    basal cell cancer on face removed; Dr Ronnald Ramp  . Fracture    right 4th and 5th metacarpal  . GERD (gastroesophageal reflux disease)   . Hearing aid worn    B/L  . HOH (hard of hearing)   . Hyperlipidemia   . Hypothyroidism   . Osteopenia after menopause   . Pneumonia   . Wears glasses     Past Surgical History:  Procedure Laterality Date  . COLONOSCOPY  2012   neg; [facility]  . HEMORRHOID SURGERY    . OPEN REDUCTION INTERNAL FIXATION (ORIF) METACARPAL Right 04/10/2020   Procedure: OPEN REDUCTION INTERNAL FIXATION (ORIF)  AS NECESSARY RIGHT FOURTH AND FIFTH METACARPAL FRACTURES;  Surgeon: [name], MD;  Location: [place];  Service: Orthopedics;  Laterality: Right;  OPEN REDUCTION INTERNAL FIXATION (ORIF)  AS NECESSARY  RIGHT FOURTH AND FIFTH METACARPAL FRACTURES    There were no vitals filed for this visit.   Subjective Assessment - 01/28/21 0924    Subjective No changes since yesterday's visit.  Ever since I'm trying to not sleep on my stomach, my pain has been better in the morning.  I can stand up straighter, not completely bent over.    Pertinent History PMH of vertigo, GERD, macular degeneration, significant degenerative changes and anteriorlistesis    Diagnostic tests MRI imaging shows Moderate levoscoliosis of lumbar spine is noted. Grade 1  anterolisthesis of L4-5 is noted secondary to posterior facet joint  hypertrophy. Mild grade 1 retrolisthesis of L3-4 is noted secondary  to moderate degenerative disc disease at this level. Severe  degenerative disc disease is noted at L1-2 and L2-3 with anterior  osteophyte formation. No fracture is noted.  IMPRESSION:  Multilevel degenerative disc disease.    Patient Stated Goals To improve balance; to help be less stiff with back pain    Currently in Pain? No/denies                             [facility] Adult PT Treatment/Exercise - 01/28/21 0001  Ambulation/Gait   Ambulation/Gait Yes    Ambulation/Gait Assistance 4: Min guard    Ambulation/Gait Assistance Details initial 300 ft no device; then 115 ft x 2 indoors with cane    Ambulation Distance (Feet) 300 Feet   115 ft indoors, 500 ft outdoors   Assistive device None;Straight cane    Gait Pattern Step-through pattern;Wide base of support;Lateral trunk lean to right;Lateral trunk lean to left    Ambulation Surface Level;Indoor    Pre-Gait Activities Gait activities included head motions indoors and outdoors with min guard, occasional veering, occasionally pt stops to reset cane sequence.    Gait Comments Pt reports having cane at home and may try to use for outdoor surfaces.           Reviewed the following (bold) exercises as part of HEP.  Pt return demo understanding: Access Code:  [email or web address] URL: [email or web address] Date: 01/28/2021 Prepared by: Mady Haagensen  Exercises (verbally reviewed back exercises)  . Supine Posterior Pelvic Tilt - 1 x daily - 7 x weekly - 1 sets - 5-10 reps . Hooklying Single Knee to Chest Stretch - 1 x daily - 7 x weekly - 1 sets - 3 reps - 10 sec hold . Supine Lower Trunk Rotation - 1 x daily - 7 x weekly - 1 sets - 5 reps - 10-15 sec hold . Walking March - 1 x daily - 5 x weekly - 1 sets - 3 reps . Tandem Walking with Counter Support - 1 x daily - 5 x weekly - 1 sets - 3 reps . Standing Near Stance in [place] with Eyes Closed - 1 x daily - 5 x weekly - 1 sets - 3 reps - 30 hold . Standing Balance in Corner with Eyes Closed - 1 x daily - 5 x weekly - 1 sets - 10 reps     Balance Exercises - 01/28/21 0001      Balance Exercises: Standing   Tandem Stance Eyes open;2 reps;30 secs   Min guard of PT, cues for glut/abdominal activation.   Wall Bumps Hip;Eyes opened;20 reps   on pillows   Balance Beam Forward/back step and weight shift, x 10 reps.  side step and weightshift x 10    Tandem Gait Forward;Foam/compliant surface;4 reps    Tandem Gait Limitations On balance beam, forward, with side step taps to floor, min guard    Sidestepping Foam/compliant support;5 reps;Limitations    Sidestepping Limitations On balance beam 2 reps BUE support, then 2 reps BUE support with head turns, then 2 rep 1 UE support and head turns min guard assist    Heel Raises 10 reps;Both   2 sets, on pillows   Toe Raise Both;10 reps   2 sets on pillows   Other Standing Exercises On compliant pillows:  marching in place x 10 reps, forward kicks x 10 reps with UE support               PT Short Term Goals - 01/27/21 1322      PT SHORT TERM GOAL #1   Title Pt will be independent with HEP for improved strength, balance, transfers, and gait.  TARGET 01/22/2021    Baseline 01/27/21: met with current HEP. updated this date as well    Status Achieved       PT SHORT TERM GOAL #2   Title Pt will improve 5x sit<>stand to less than or equal to 13 sec to demonstrate improved functional strength and transfer efficiency.  Baseline 01/20/21:  14.5 sec's (improved from 16.75 sec's)    Status Partially Met      PT SHORT TERM GOAL #3   Title Pt will improve FGA score to at least 18/30 to decrease fall risk.    Baseline 01/20/21: 16/30, improved from 13/30 just not to goal level    Status Partially Met      PT SHORT TERM GOAL #4   Title Pt will be able to stand with Eyes closed on compliant surface for at least 15 seconds to demo improved vestibular system function for balance.    Baseline 01/20/21:  met this date    Status Achieved      PT SHORT TERM GOAL #5   Title Pt will verbalize understanding of fall prevention in home environment.    Baseline 01/27/21: met in session today    Time 4    Period Weeks    Status On-going      PT SHORT TERM GOAL #6   Title Pt will verbalize/demo understanding of posture/positioning to decreased low back pain with functional mobility.  TARGET 01/22/2021    Baseline 01/27/21: met to date with sleeping, sitting and standing    Status Achieved             PT Long Term Goals - 01/08/21 1153      PT LONG TERM GOAL #1   Title Pt will be independent with progression of HEP for improved strength, back pain, balance, transfers, and gait.  TARGET  for all LTGs 02/19/2021    Time 8    Period Weeks    Status Revised      PT LONG TERM GOAL #2   Title Pt will improve FGA score to at least 22/30 to decrease fall risk.    Time 8    Period Weeks    Status On-going      PT LONG TERM GOAL #3   Title Pt will stand eyes closed on compliant surface x 30 seconds to demo improved vestibular system use for balance.    Time 8    Period Weeks    Status On-going      PT LONG TERM GOAL #4   Title Pt will ambulate at least 1000 ft indoor and outdoor surfaces, using appropriate assistive device, mod I and no LOB, for improved  community gait.    Time 8    Period Weeks    Status On-going      PT LONG TERM GOAL #5   Title Pt will improve SOT composite score to at least 50 for improved overall balance.    Time 6    Period Weeks    Status New      PT LONG TERM GOAL #6   Title Oswestry Back pain questionaire to improve to less than or equal to 15% disability, to demo improved overall back pain with functional activities.    Time 6    Period Weeks    Status Revised      PT LONG TERM GOAL #7   Title Pt will report at least 50% improvement in back pain during morning hours, to improve overall functional mobility with decreased pain.    Baseline reports 8-9/10    Time 6    Period Weeks    Status New                 Plan - 01/28/21 1014    Clinical Impression Statement Continued to work on balance on  unlevel and level surfaces with and without cane.  With cane use, pt continues to demo decreased veering and decreased trunk lean with gait, though it does become more apparent with fatigue.  She seems more open to using cane today and may try to use one that she has at home.  She will continue to benefit from skilled PT towards goals for improved overall functional mobility.    Personal Factors and Comorbidities Comorbidity 3+    Comorbidities high cholesterol, thyroid, anxiety, fractures R hand and ribs    Examination-Activity Limitations Locomotion Level;Transfers;Stand;Caring for Others    Examination-Participation Restrictions Community Activity    Stability/Clinical Decision Making Evolving/Moderate complexity    Rehab Potential Good    PT Frequency 3x / week    PT Duration 8 weeks   3x/wk to start week of 11/07/3612 per recert 12/07/1538   PT Treatment/Interventions ADLs/Self Care Home Management;Gait training;Stair training;Functional mobility training;Therapeutic activities;Therapeutic exercise;Balance training;DME Instruction;Neuromuscular re-education;Patient/family education;Vestibular;Moist  Heat;Traction;Ultrasound;Electrical Stimulation;Manual techniques    PT Next Visit Plan try gait again with cane (on outdoor surfaces if possible).  May try resisted gait activities in parallel bars-ask about more scheduling/[employer] reauth request after next week. FOR BACK:  update HEP for flexibility/stability; review posture and positioning education (including getting out of bed, in/out of car), manual traction, lumbar flexibility, lumbar/abdominal stability;  FOR BALANCE:  Continue to work on balance strategies, varied surfaces, vision removed for balance/vestibular system retraining, core and proximal hip strengthening to improve balance with single limb stance, stepping up/down curbs/stairs, turning    PT Home Exercise Plan Access Code: [email or web address]    Consulted and Agree with Plan of Care Patient           Patient will benefit from skilled therapeutic intervention in order to improve the following deficits and impairments:  Abnormal gait,Difficulty walking,Decreased balance,Decreased mobility,Decreased strength  Visit Diagnosis: Other abnormalities of gait and mobility  Unsteadiness on feet     Problem List Patient Active Problem List   Diagnosis Date Noted  . Acute left-sided low back pain without sciatica 08/06/2018  . Anxiety 07/11/2017  . Hypothyroidism 02/07/2015  . Arthralgia of multiple joints 05/28/2014  . Vitamin D deficiency 05/09/2012  . Osteopenia 05/26/2010  . Hypersomnia 02/22/2010  . Hyperlipidemia 01/21/2010  . Diverticulosis of large intestine 01/21/2010  . 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 SKIN CANCER, HX OF 01/21/2010    Frazier Butt. 01/28/2021, 10:16 AM Frazier Butt., PT  [facility] [address] [place], [place], [address] Phone: [phone]   Fax:  [phone]  Name: CYLEE DATTILO MRN: [id number] Date of Birth: 1945-10-04

## 2021-01-28 NOTE — Therapy (Addendum)
[facility] [address] [place], [place], [address] Phone: [phone]   Fax:  [phone]  Physical Therapy Treatment  Patient Details  Name: FALON HUESCA MRN: [id number] Date of Birth: [birth year] Referring Provider (PT): Hal Hope Tamala Julian); cc'd to Cleta Alberts, [facility]   Encounter Date: 01/27/2021   PT End of Session - 01/27/21 1321    Visit Number 12    Number of Visits 24   per recert to include lumbar pain   Date for PT Re-Evaluation 71/69/67   90 day cert for 8 wk POC   Authorization Type [employer] Medicare-submitted upon completion of eval    Authorization Time Period 24 visits from 4/21 - 6/3 - updated    Authorization - Visit Number 11    Authorization - Number of Visits 24    Progress Note Due on Visit 20    PT Start Time 8938    PT Stop Time 1400    PT Time Calculation (min) 43 min    Equipment Utilized During Treatment Gait belt    Activity Tolerance Patient tolerated treatment well    Behavior During Therapy [facility] for tasks assessed/performed           Past Medical History:  Diagnosis Date  . Allergy    seasonal allegies; seem worse this year  . Arthritis   . Cancer ([place])    basal cell cancer on face removed; Dr Ronnald Ramp  . Fracture    right 4th and 5th metacarpal  . GERD (gastroesophageal reflux disease)   . Hearing aid worn    B/L  . HOH (hard of hearing)   . Hyperlipidemia   . Hypothyroidism   . Osteopenia after menopause   . Pneumonia   . Wears glasses     Past Surgical History:  Procedure Laterality Date  . COLONOSCOPY  2012   neg; [facility]  . HEMORRHOID SURGERY    . OPEN REDUCTION INTERNAL FIXATION (ORIF) METACARPAL Right 04/10/2020   Procedure: OPEN REDUCTION INTERNAL FIXATION (ORIF)  AS NECESSARY RIGHT FOURTH AND FIFTH METACARPAL FRACTURES;  Surgeon: [name], MD;  Location: [facility];  Service: Orthopedics;  Laterality: Right;  OPEN REDUCTION INTERNAL FIXATION (ORIF)  AS NECESSARY  RIGHT FOURTH AND FIFTH METACARPAL FRACTURES    There were no vitals filed for this visit.   Subjective Assessment - 01/27/21 1320    Subjective No new complaints. Use of pillow at night is helping with back pain and she is able to stand up in the am better.    Pertinent History PMH of vertigo, GERD, macular degeneration, significant degenerative changes and anteriorlistesis    Diagnostic tests MRI imaging shows Moderate levoscoliosis of lumbar spine is noted. Grade 1  anterolisthesis of L4-5 is noted secondary to posterior facet joint  hypertrophy. Mild grade 1 retrolisthesis of L3-4 is noted secondary  to moderate degenerative disc disease at this level. Severe  degenerative disc disease is noted at L1-2 and L2-3 with anterior  osteophyte formation. No fracture is noted.  IMPRESSION:  Multilevel degenerative disc disease.    Patient Stated Goals To improve balance; to help be less stiff with back pain    Currently in Pain? No/denies    Pain Score 0-No pain                  [facility] Adult PT Treatment/Exercise - 01/27/21 1325      Transfers   Transfers Sit to Stand;Stand to Sit    Sit to  Stand 6: Modified independent (Device/Increase time);Without upper extremity assist;From chair/3-in-1    Stand to Sit 6: Modified independent (Device/Increase time);To chair/3-in-1;With upper extremity assist      Ambulation/Gait   Ambulation/Gait Yes    Ambulation/Gait Assistance 5: Supervision    Ambulation/Gait Assistance Details around clinic with session    Assistive device None    Gait Pattern Step-through pattern;Wide base of support;Lateral trunk lean to right;Lateral trunk lean to left    Ambulation Surface Level;Indoor      Self-Care   Self-Care Other Self-Care Comments    Other Self-Care Comments  educated pt on fall prevention strategies for home. Handout provided with review in session today.      Exercises   Exercises Other Exercises    Other Exercises  reviewed adn advanced  balance ex's today. Refer to [place] for full details. Min guard assist for safety with balance ex's. No issues noted or reported in session.          issued to HEP this session: Exercises Supine Posterior Pelvic Tilt - 1 x daily - 7 x weekly - 1 sets - 5-10 reps Hooklying Single Knee to Chest Stretch - 1 x daily - 7 x weekly - 1 sets - 3 reps - 10 sec hold Supine Lower Trunk Rotation - 1 x daily - 7 x weekly - 1 sets - 5 reps - 10-15 sec hold Walking March - 1 x daily - 5 x weekly - 1 sets - 3 reps Tandem Walking with Counter Support - 1 x daily - 5 x weekly - 1 sets - 3 reps Standing Near Stance in Corner with Eyes Closed - 1 x daily - 5 x weekly - 1 sets - 3 reps - 30 hold Standing Balance in Corner with Eyes Closed - 1 x daily - 5 x weekly - 1 sets - 10 reps    Balance Exercises - 01/27/21 1630      Balance Exercises: Standing   Rockerboard Anterior/posterior;Lateral;EO;EC;30 seconds;Other reps (comment);Limitations    Rockerboard Limitations on rockerboard in both ways- rocking the board with EO, then EC with emphasis on tall posture. then holding the board steady for EC 30 sec's x 3 reps. min guard to min assist for balance with occasional touch to bars.           PT Education - 01/27/21 1630    Education Details fall prevention strategies; updated balance HEP    Person(s) Educated Patient    Methods Explanation;Demonstration;Tactile cues;Verbal cues;Handout    Comprehension Verbalized understanding;Returned demonstration            PT Short Term Goals - 01/27/21 1322      PT SHORT TERM GOAL #1   Title Pt will be independent with HEP for improved strength, balance, transfers, and gait.  TARGET 01/22/2021    Baseline 01/27/21: met with current HEP. updated this date as well    Status Achieved      PT SHORT TERM GOAL #2   Title Pt will improve 5x sit<>stand to less than or equal to 13 sec to demonstrate improved functional strength and transfer efficiency.    Baseline  01/20/21:  14.5 sec's (improved from 16.75 sec's)    Status Partially Met      PT SHORT TERM GOAL #3   Title Pt will improve FGA score to at least 18/30 to decrease fall risk.    Baseline 01/20/21: 16/30, improved from 13/30 just not to goal level    Status Partially  Met      PT SHORT TERM GOAL #4   Title Pt will be able to stand with Eyes closed on compliant surface for at least 15 seconds to demo improved vestibular system function for balance.    Baseline 01/20/21:  met this date    Status Achieved      PT SHORT TERM GOAL #5   Title Pt will verbalize understanding of fall prevention in home environment.    Baseline 01/27/21: met in session today    Time 4    Period Weeks    Status On-going      PT SHORT TERM GOAL #6   Title Pt will verbalize/demo understanding of posture/positioning to decreased low back pain with functional mobility.  TARGET 01/22/2021    Baseline 01/27/21: met to date with sleeping, sitting and standing    Status Achieved             PT Long Term Goals - 01/08/21 1153      PT LONG TERM GOAL #1   Title Pt will be independent with progression of HEP for improved strength, back pain, balance, transfers, and gait.  TARGET  for all LTGs 02/19/2021    Time 8    Period Weeks    Status Revised      PT LONG TERM GOAL #2   Title Pt will improve FGA score to at least 22/30 to decrease fall risk.    Time 8    Period Weeks    Status On-going      PT LONG TERM GOAL #3   Title Pt will stand eyes closed on compliant surface x 30 seconds to demo improved vestibular system use for balance.    Time 8    Period Weeks    Status On-going      PT LONG TERM GOAL #4   Title Pt will ambulate at least 1000 ft indoor and outdoor surfaces, using appropriate assistive device, mod I and no LOB, for improved community gait.    Time 8    Period Weeks    Status On-going      PT LONG TERM GOAL #5   Title Pt will improve SOT composite score to at least 50 for improved overall  balance.    Time 6    Period Weeks    Status New      PT LONG TERM GOAL #6   Title Oswestry Back pain questionaire to improve to less than or equal to 15% disability, to demo improved overall back pain with functional activities.    Time 6    Period Weeks    Status Revised      PT LONG TERM GOAL #7   Title Pt will report at least 50% improvement in back pain during morning hours, to improve overall functional mobility with decreased pain.    Baseline reports 8-9/10    Time 6    Period Weeks    Status New                 Plan - 01/27/21 1322    Clinical Impression Statement Today's skilled session focused on remainder of STGs with goals met. Remainder of session focused on updating HEP with no issues noted or reported. The pt is progressing toward goals and should benefit from continued PT to unmet goals.    Personal Factors and Comorbidities Comorbidity 3+    Comorbidities high cholesterol, thyroid, anxiety, fractures R hand and ribs    Examination-Activity Limitations  Locomotion Level;Transfers;Stand;Caring for Others    Examination-Participation Restrictions Community Activity    Stability/Clinical Decision Making Evolving/Moderate complexity    Rehab Potential Good    PT Frequency 3x / week    PT Duration 8 weeks   3x/wk to start week of 0/03/4599 per recert 10/15/8471   PT Treatment/Interventions ADLs/Self Care Home Management;Gait training;Stair training;Functional mobility training;Therapeutic activities;Therapeutic exercise;Balance training;DME Instruction;Neuromuscular re-education;Patient/family education;Vestibular;Moist Heat;Traction;Ultrasound;Electrical Stimulation;Manual techniques    PT Next Visit Plan try gait again with cane (on outdoor surfaces if possible) FOR BACK:  update HEP for flexibility/stability; review posture and positioning education (including getting out of bed, in/out of car), manual traction, lumbar flexibility, lumbar/abdominal stability;  FOR  BALANCE:  Continue to work on balance strategies, varied surfaces, vision removed for balance/vestibular system retraining, core and proximal hip strengthening to improve balance with single limb stance, stepping up/down curbs/stairs, turning    PT Home Exercise Plan Access Code: [email or web address]    Consulted and Agree with Plan of Care Patient           Patient will benefit from skilled therapeutic intervention in order to improve the following deficits and impairments:  Abnormal gait,Difficulty walking,Decreased balance,Decreased mobility,Decreased strength  Visit Diagnosis: Other abnormalities of gait and mobility  Unsteadiness on feet  Muscle weakness (generalized)     Problem List Patient Active Problem List   Diagnosis Date Noted  . Acute left-sided low back pain without sciatica 08/06/2018  . Anxiety 07/11/2017  . Hypothyroidism 02/07/2015  . Arthralgia of multiple joints 05/28/2014  . Vitamin D deficiency 05/09/2012  . Osteopenia 05/26/2010  . Hypersomnia 02/22/2010  . Hyperlipidemia 01/21/2010  . Diverticulosis of large intestine 01/21/2010  . 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 SKIN CANCER, HX OF 01/21/2010    Willow Ora, PTA, [facility] [address] [place], [place] [address] [phone] 01/28/21, 9:30 AM  Name: GISSELE NARDUCCI MRN: [id number] Date of Birth: 07-18-1946

## 2021-01-29 ENCOUNTER — Ambulatory Visit: Payer: Medicare PPO | Admitting: Physical Therapy

## 2021-02-02 ENCOUNTER — Ambulatory Visit: Payer: Medicare PPO | Admitting: Physical Therapy

## 2021-02-03 ENCOUNTER — Ambulatory Visit: Payer: Medicare PPO | Attending: Family Medicine | Admitting: Physical Therapy

## 2021-02-03 ENCOUNTER — Encounter: Payer: Self-pay | Admitting: Physical Therapy

## 2021-02-03 ENCOUNTER — Other Ambulatory Visit: Payer: Self-pay

## 2021-02-03 DIAGNOSIS — R293 Abnormal posture: Secondary | ICD-10-CM | POA: Diagnosis not present

## 2021-02-03 DIAGNOSIS — M6281 Muscle weakness (generalized): Secondary | ICD-10-CM | POA: Diagnosis not present

## 2021-02-03 DIAGNOSIS — M545 Low back pain, unspecified: Secondary | ICD-10-CM | POA: Insufficient documentation

## 2021-02-03 DIAGNOSIS — G8929 Other chronic pain: Secondary | ICD-10-CM | POA: Diagnosis not present

## 2021-02-03 DIAGNOSIS — R2689 Other abnormalities of gait and mobility: Secondary | ICD-10-CM | POA: Insufficient documentation

## 2021-02-03 DIAGNOSIS — R2681 Unsteadiness on feet: Secondary | ICD-10-CM | POA: Insufficient documentation

## 2021-02-03 IMAGING — CR DG RIBS W/ CHEST 3+V*R*
3 series · 3 of 3 positions shown · non-contrast
Comparison: 05/08/2015

CLINICAL DATA: Right lower rib pain after a fall down stairs.

EXAM:
RIGHT RIBS AND CHEST - 3+ VIEW

[w chest pa]
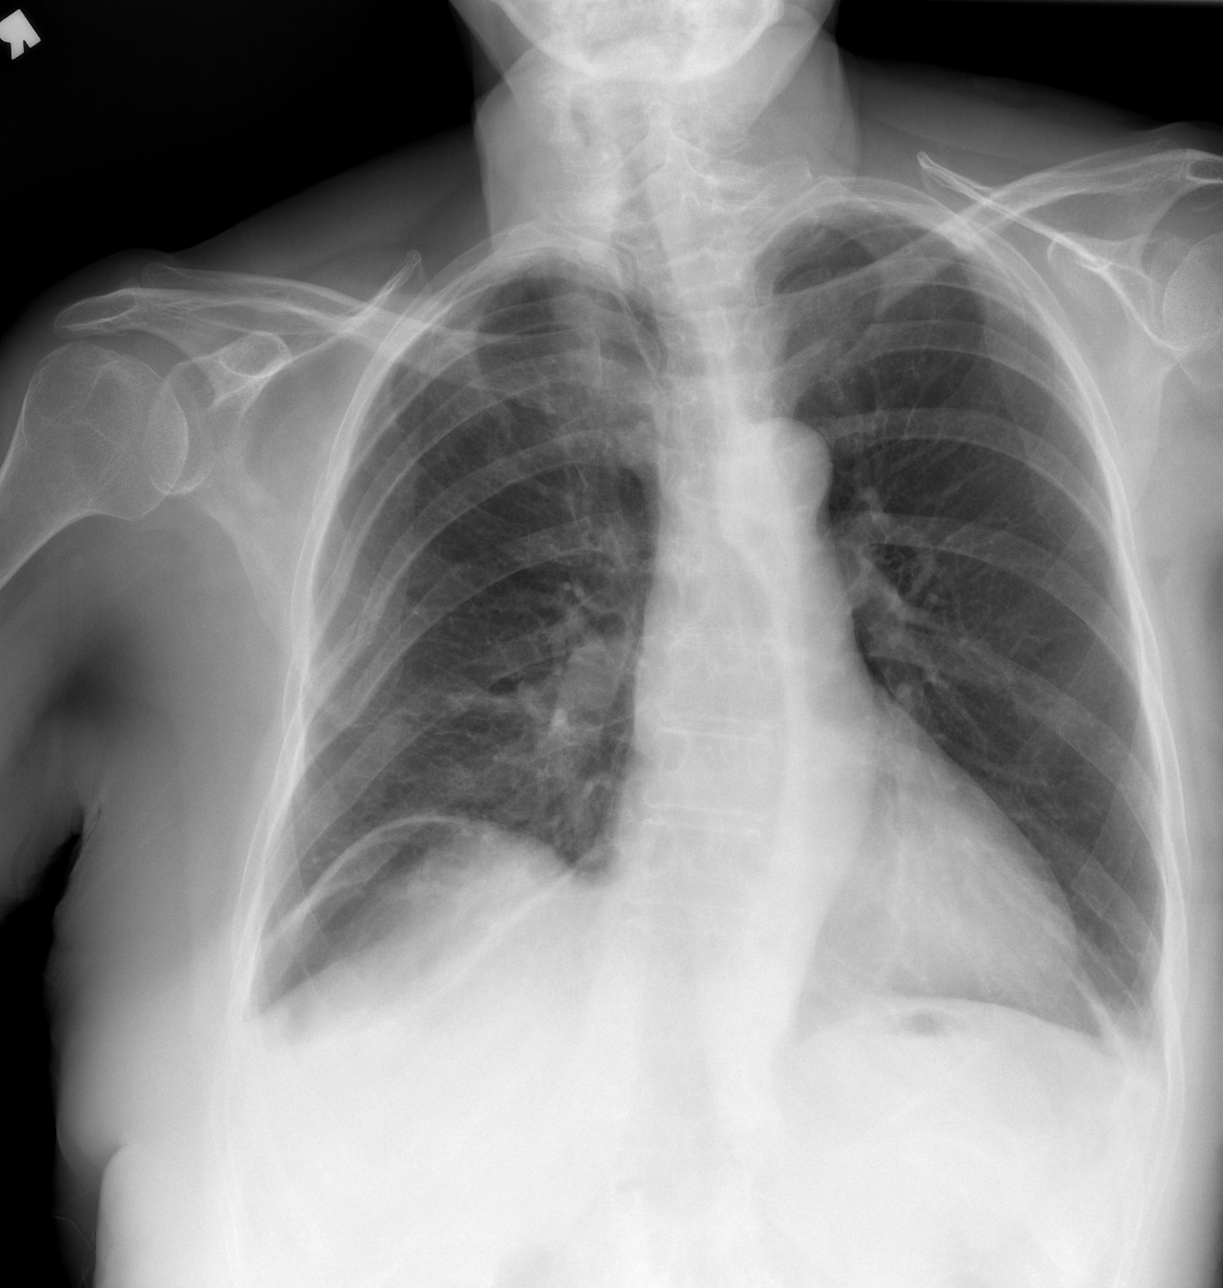

[w ribs ap/pa upper right]
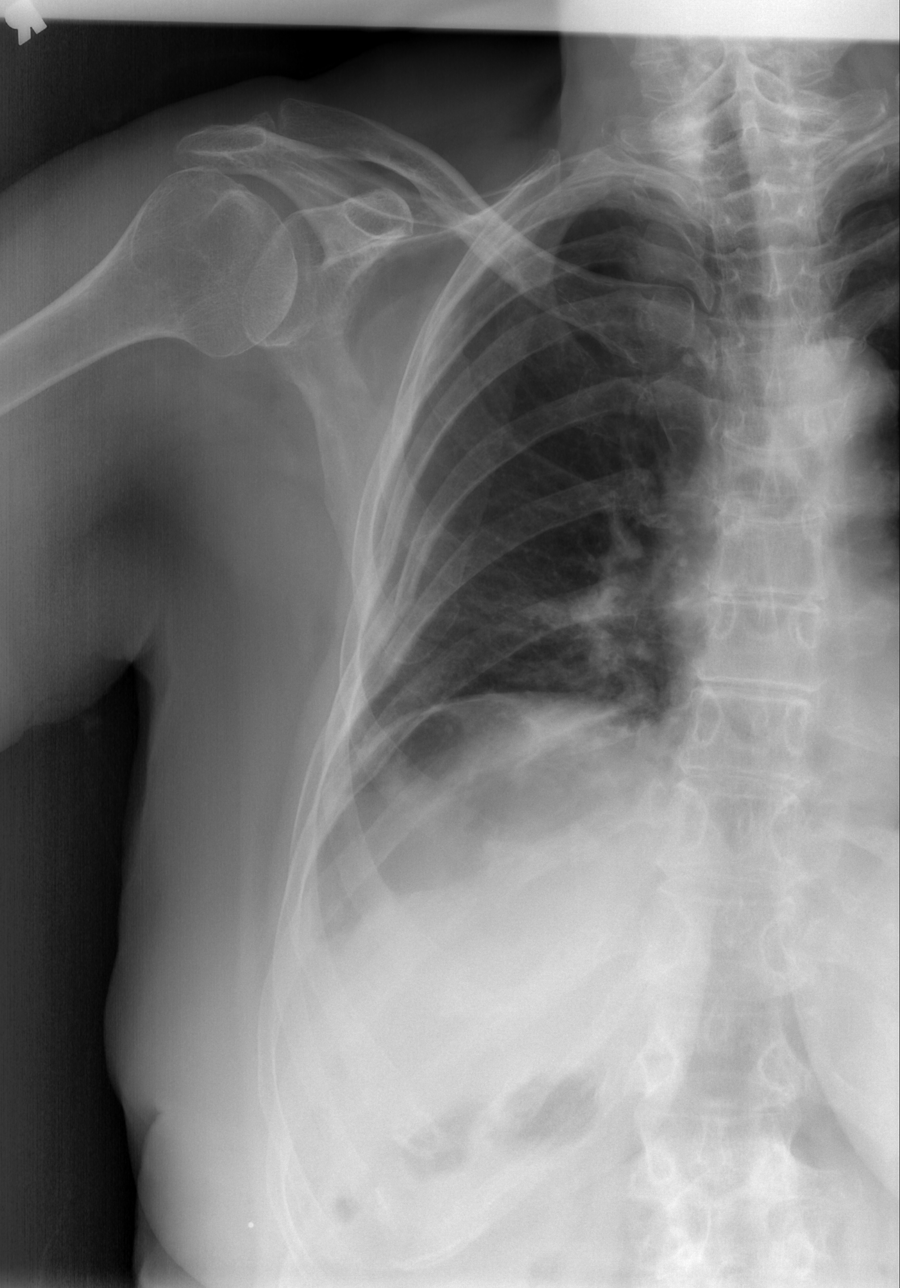

[w ribs oblique right]
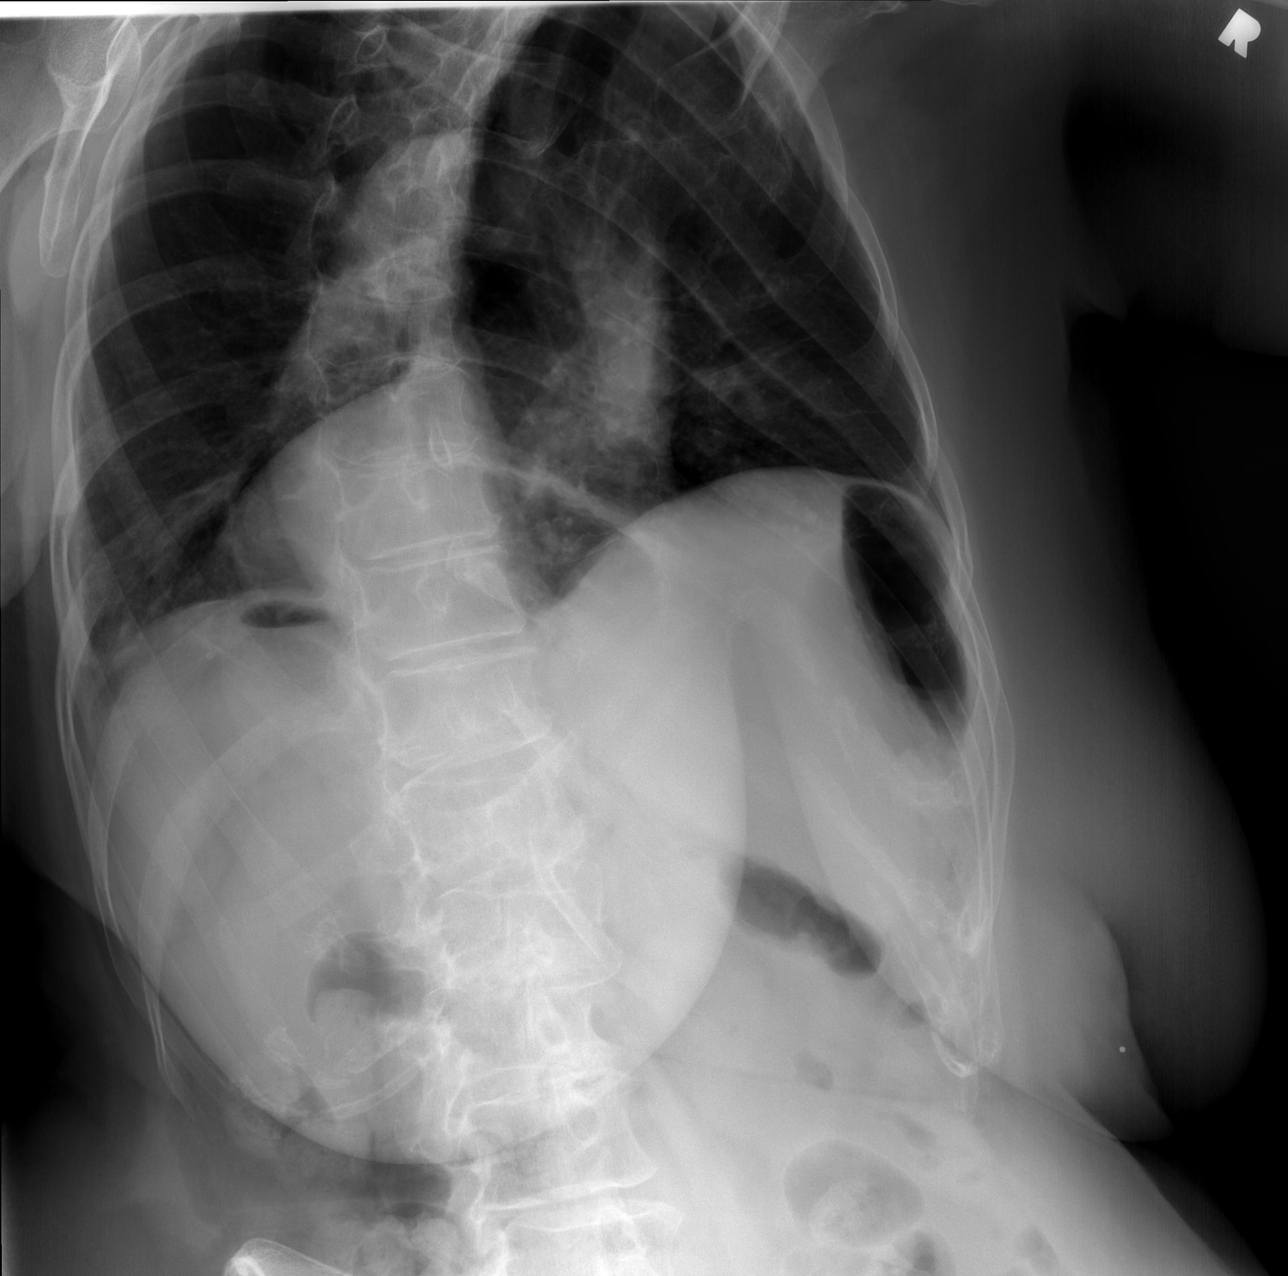

[3 of 3 positions shown; findings below may reference images not displayed]

FINDINGS: Normal heart size and pulmonary vascularity. Lungs are clear. No
pneumothorax. Mediastinal contours appear intact. Gas is
demonstrated beneath the right hemidiaphragm likely representing
colonic interposition. Mildly displaced acute appearing fractures of
the right third, fourth, fifth, sixth, seventh, and eighth ribs.
IMPRESSION: Multiple acute right rib fractures. No pneumothorax. No evidence of
active pulmonary disease.

## 2021-02-04 NOTE — Therapy (Signed)
[name] [address] [name], [name], [address] Phone: [phone]   Fax:  [phone]  Physical Therapy Treatment  Patient Details  Name: Audrey [name] MRN: [id number] Date of Birth: [birth year] Referring Provider (PT): [name]); cc'd to [name], [name]   Encounter Date: 02/03/2021   PT End of Session - 02/03/21 1236    Visit Number 14    Number of Visits 24   per recert to include lumbar pain   Date for PT Re-Evaluation 78/67/67   90 day cert for 8 wk POC   Authorization Type [name]-submitted upon completion of eval    Authorization Time Period 24 visits from 4/21 - 6/3 - updated    Authorization - Visit Number 13    Authorization - Number of Visits 24    Progress Note Due on Visit 20    PT Start Time 1233    PT Stop Time 1315    PT Time Calculation (min) 42 min    Equipment Utilized During Treatment Gait belt    Activity Tolerance Patient tolerated treatment well    Behavior During Therapy WFL for tasks assessed/performed           Past Medical History:  Diagnosis Date  . Allergy    seasonal allegies; seem worse this year  . Arthritis   . Cancer ([name])    basal cell cancer on face removed; [name]  . Fracture    right 4th and 5th metacarpal  . GERD (gastroesophageal reflux disease)   . Hearing aid worn    B/L  . HOH (hard of hearing)   . Hyperlipidemia   . Hypothyroidism   . Osteopenia after menopause   . Pneumonia   . Wears glasses     Past Surgical History:  Procedure Laterality Date  . COLONOSCOPY  2012   neg; [name]  . HEMORRHOID SURGERY    . OPEN REDUCTION INTERNAL FIXATION (ORIF) METACARPAL Right 04/10/2020   Procedure: OPEN REDUCTION INTERNAL FIXATION (ORIF)  AS NECESSARY RIGHT FOURTH AND FIFTH METACARPAL FRACTURES;  Surgeon: Audrey Kaufman, MD;  Location: [name];  Service: Orthopedics;  Laterality: Right;  OPEN REDUCTION INTERNAL FIXATION (ORIF)  AS NECESSARY  RIGHT FOURTH AND FIFTH METACARPAL FRACTURES    There were no vitals filed for this visit.   Subjective Assessment - 02/03/21 1235    Subjective No falls. Has been using the cane at home. Took an aleve today as she was more sore from going to [name] yesterday.    Pertinent History PMH of vertigo, GERD, macular degeneration, significant degenerative changes and anteriorlistesis    Diagnostic tests MRI imaging shows Moderate levoscoliosis of lumbar spine is noted. Grade 1  anterolisthesis of L4-5 is noted secondary to posterior facet joint  hypertrophy. Mild grade 1 retrolisthesis of L3-4 is noted secondary  to moderate degenerative disc disease at this level. Severe  degenerative disc disease is noted at L1-2 and L2-3 with anterior  osteophyte formation. No fracture is noted.  IMPRESSION:  Multilevel degenerative disc disease.    Patient Stated Goals To improve balance; to help be less stiff with back pain    Currently in Pain? No/denies    Pain Score 0-No pain              [facility] PT Assessment - 02/03/21 1239      Functional Gait  Assessment   Gait assessed  Yes    Gait Level Surface Walks 20 ft in  less than 5.5 sec, no assistive devices, good speed, no evidence for imbalance, normal gait pattern, deviates no more than 6 in outside of the 12 in walkway width.   5.47 sec's   Change in Gait Speed Able to smoothly change walking speed without loss of balance or gait deviation. Deviate no more than 6 in outside of the 12 in walkway width.    Gait with Horizontal Head Turns Performs head turns smoothly with slight change in gait velocity (eg, minor disruption to smooth gait path), deviates 6-10 in outside 12 in walkway width, or uses an assistive device.   minor veer   Gait with Vertical Head Turns Performs head turns with no change in gait. Deviates no more than 6 in outside 12 in walkway width.    Gait and Pivot Turn Pivot turns safely within 3 sec and stops quickly with no loss of balance.     Step Over Obstacle Is able to step over one shoe box (4.5 in total height) without changing gait speed. No evidence of imbalance.    Gait with Narrow Base of Support Ambulates less than 4 steps heel to toe or cannot perform without assistance.    Gait with Eyes Closed Cannot walk 20 ft without assistance, severe gait deviations or imbalance, deviates greater than 15 in outside 12 in walkway width or will not attempt task.    Ambulating Backwards Walks 20 ft, uses assistive device, slower speed, mild gait deviations, deviates 6-10 in outside 12 in walkway width.   19.25 sec's with supervision   Steps Alternating feet, must use rail.    Total Score 20    FGA comment: 19-24 medium fall risk               [facility] Adult PT Treatment/Exercise - 02/03/21 1239      Transfers   Transfers Sit to Stand;Stand to Sit    Sit to Stand 6: Modified independent (Device/Increase time);Without upper extremity assist;From chair/3-in-1    Stand to Sit 6: Modified independent (Device/Increase time);To chair/3-in-1;With upper extremity assist      Ambulation/Gait   Ambulation/Gait Yes    Ambulation/Gait Assistance 4: Min guard    Ambulation Distance (Feet) 1000 Feet   x1 with cane in/outdoors   Assistive device None;Straight cane    Gait Pattern Step-through pattern;Wide base of support;Lateral trunk lean to right;Lateral trunk lean to left    Ambulation Surface Level;Unlevel;Indoor;Outdoor;Paved             02/03/21 1313  Balance Exercises: Standing  Standing Eyes Closed Wide (BOA);Head turns;Foam/compliant surface;Other reps (comment);10 secs;20 secs;30 secs;Limitations  Standing Eyes Closed Limitations on airex no UE support- feet hip width apart- EC from 10-30 sec's holds over several tries with up to min assist needed due to posterior loss of balance; with fingertip support progressed to EC head movements left<>right, up<>down for ~10 reps each way. min guard to min assist for balance.         PT Short Term Goals - 01/27/21 1322      PT SHORT TERM GOAL #1   Title Pt will be independent with HEP for improved strength, balance, transfers, and gait.  TARGET 01/22/2021    Baseline 01/27/21: met with current HEP. updated this date as well    Status Achieved      PT SHORT TERM GOAL #2   Title Pt will improve 5x sit<>stand to less than or equal to 13 sec to demonstrate improved functional strength and transfer efficiency.  Baseline 01/20/21:  14.5 sec's (improved from 16.75 sec's)    Status Partially Met      PT SHORT TERM GOAL #3   Title Pt will improve FGA score to at least 18/30 to decrease fall risk.    Baseline 01/20/21: 16/30, improved from 13/30 just not to goal level    Status Partially Met      PT SHORT TERM GOAL #4   Title Pt will be able to stand with Eyes closed on compliant surface for at least 15 seconds to demo improved vestibular system function for balance.    Baseline 01/20/21:  met this date    Status Achieved      PT SHORT TERM GOAL #5   Title Pt will verbalize understanding of fall prevention in home environment.    Baseline 01/27/21: met in session today    Time 4    Period Weeks    Status On-going      PT SHORT TERM GOAL #6   Title Pt will verbalize/demo understanding of posture/positioning to decreased low back pain with functional mobility.  TARGET 01/22/2021    Baseline 01/27/21: met to date with sleeping, sitting and standing    Status Achieved             PT Long Term Goals - 02/03/21 1238      PT LONG TERM GOAL #1   Title Pt will be independent with progression of HEP for improved strength, back pain, balance, transfers, and gait.  TARGET  for all LTGs 02/19/2021    Time 8    Period Weeks    Status On-going      PT LONG TERM GOAL #2   Title Pt will improve FGA score to at least 22/30 to decrease fall risk.    Baseline 02/03/21: 20/30 scored today, improved from 16/30 just not to goal    Time --    Period --    Status Partially Met      PT  LONG TERM GOAL #3   Title Pt will stand eyes closed on compliant surface x 30 seconds to demo improved vestibular system use for balance.    Baseline 02/03/21: took pt several atttempts with 10/30 second holds    Time --    Period --    Status Partially Met      PT LONG TERM GOAL #4   Title Pt will ambulate at least 1000 ft indoor and outdoor surfaces, using appropriate assistive device, mod I and no LOB, for improved community gait.    Baseline 02/03/21: met distance with use of cane with min guard to supervision assist due to veering, poor sequencing with cane with imbalance when trying to correct sequencing.    Time --    Period --    Status Partially Met      PT LONG TERM GOAL #5   Title Pt will improve SOT composite score to at least 50 for improved overall balance.    Time 6    Period Weeks    Status On-going      PT LONG TERM GOAL #6   Title Oswestry Back pain questionaire to improve to less than or equal to 15% disability, to demo improved overall back pain with functional activities.    Baseline 02/03/21: pt scored 16% disability today    Time --    Period --    Status Partially Met      PT LONG TERM GOAL #7   Title Pt  will report at least 50% improvement in back pain during morning hours, to improve overall functional mobility with decreased pain.    Baseline 02/03/21:Pt reports with use of pillows her pain has greatly improved. She can walk upright as soon as she gets up where before she could not. Still looking for the perfect pillow.    Time --    Period --    Status Achieved                 Plan - 02/03/21 1237    Clinical Impression Statement Today's skilled session focused on progress toward LTGs with goal 7 met for reduction in pain. All other goals checked were partially met this session, showing progress just not to goal level. Will plan to check remaining goals at next session. Pt unsure if she wants to continue or discharge at next session. Primary PT  agreeable with either recert to continue to address balance/strengthening or discharge if pt is ready.    Personal Factors and Comorbidities Comorbidity 3+    Comorbidities high cholesterol, thyroid, anxiety, fractures R hand and ribs    Examination-Activity Limitations Locomotion Level;Transfers;Stand;Caring for Others    Examination-Participation Restrictions Community Activity    Stability/Clinical Decision Making Evolving/Moderate complexity    Rehab Potential Good    PT Frequency 3x / week    PT Duration 8 weeks   3x/wk to start week of 8/0/9983 per recert 11/11/2503   PT Treatment/Interventions ADLs/Self Care Home Management;Gait training;Stair training;Functional mobility training;Therapeutic activities;Therapeutic exercise;Balance training;DME Instruction;Neuromuscular re-education;Patient/family education;Vestibular;Moist Heat;Traction;Ultrasound;Electrical Stimulation;Manual techniques    PT Next Visit Plan Assess remaining LTGs- SOT and HEP for recert vs discharge.   PT Home Exercise Plan Access Code: [email or web address]    Consulted and Agree with Plan of Care Patient           Patient will benefit from skilled therapeutic intervention in order to improve the following deficits and impairments:  Abnormal gait,Difficulty walking,Decreased balance,Decreased mobility,Decreased strength  Visit Diagnosis: Other abnormalities of gait and mobility  Unsteadiness on feet  Muscle weakness (generalized)     Problem List Patient Active Problem List   Diagnosis Date Noted  . Acute left-sided low back pain without sciatica 08/06/2018  . Anxiety 07/11/2017  . Hypothyroidism 02/07/2015  . Arthralgia of multiple joints 05/28/2014  . Vitamin D deficiency 05/09/2012  . Osteopenia 05/26/2010  . Hypersomnia 02/22/2010  . Hyperlipidemia 01/21/2010  . Diverticulosis of large intestine 01/21/2010  . 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 SKIN CANCER, HX OF 01/21/2010    Willow Ora,  PTA, [facility] Out[facility] Neuro [facility] [address], [place],  [address] [phone] 02/04/21, 3:27 PM   Name: Audrey [name] MRN: [id number] Date of Birth: Aug 05, 1946

## 2021-02-05 ENCOUNTER — Encounter: Payer: Self-pay | Admitting: Physical Therapy

## 2021-02-05 ENCOUNTER — Ambulatory Visit: Payer: Medicare PPO | Admitting: Physical Therapy

## 2021-02-05 ENCOUNTER — Other Ambulatory Visit: Payer: Self-pay

## 2021-02-05 DIAGNOSIS — M545 Low back pain, unspecified: Secondary | ICD-10-CM | POA: Diagnosis not present

## 2021-02-05 DIAGNOSIS — R2681 Unsteadiness on feet: Secondary | ICD-10-CM

## 2021-02-05 DIAGNOSIS — M6281 Muscle weakness (generalized): Secondary | ICD-10-CM | POA: Diagnosis not present

## 2021-02-05 DIAGNOSIS — G8929 Other chronic pain: Secondary | ICD-10-CM | POA: Diagnosis not present

## 2021-02-05 DIAGNOSIS — R2689 Other abnormalities of gait and mobility: Secondary | ICD-10-CM

## 2021-02-05 DIAGNOSIS — R293 Abnormal posture: Secondary | ICD-10-CM | POA: Diagnosis not present

## 2021-02-05 NOTE — Patient Instructions (Addendum)
  Access Code: [email or web address] URL: [email or web address] Date: 02/05/2021 Prepared by: Willow Ora  Exercises Supine Posterior Pelvic Tilt - 1 x daily - 7 x weekly - 1 sets - 5-10 reps Hooklying Single Knee to Chest Stretch - 1 x daily - 7 x weekly - 1 sets - 3 reps - 10 sec hold Supine Lower Trunk Rotation - 1 x daily - 7 x weekly - 1 sets - 5 reps - 10-15 sec hold Walking March - 1 x daily - 5 x weekly - 1 sets - 3 reps Tandem Walking with Counter Support - 1 x daily - 5 x weekly - 1 sets - 3 reps  Reviewed/Added this session: Standing Near Stance in Corner with Eyes Closed - 1 x daily - 5 x weekly - 1 sets - 3 reps - 30 hold Standing Balance in Corner with Eyes Closed - 1 x daily - 5 x weekly - 1 sets - 10 reps Wide Tandem Stance with Eyes Open - 1 x daily - 5 x weekly - 1 sets - 10 reps

## 2021-02-05 NOTE — Therapy (Addendum)
[facility] [address] [place], [place], [address] Phone: [phone]   Fax:  [phone]  Physical Therapy Treatment/Recert  Patient Details  Name: Audrey [name] MRN: [id number] Date of Birth: 10-02-1945 Referring Provider (PT): Hal Hope Tamala Julian); cc'd to Cleta Alberts, [facility]   Encounter Date: 02/05/2021   PT End of Session - 02/05/21 1235    Visit Number 15    Number of Visits 24   per recert to include lumbar pain   Date for PT Re-Evaluation 91/63/84   90 day cert for 8 wk POC   Authorization Type [employer]-submitted upon completion of eval    Authorization Time Period 24 visits from 4/21 - 6/3 - updated    Authorization - Visit Number 14    Authorization - Number of Visits 24    Progress Note Due on Visit 20    PT Start Time 1232    PT Stop Time 1315    PT Time Calculation (min) 43 min    Equipment Utilized During Treatment Gait belt    Activity Tolerance Patient tolerated treatment well    Behavior During Therapy [facility] for tasks assessed/performed           Past Medical History:  Diagnosis Date  . Allergy    seasonal allegies; seem worse this year  . Arthritis   . Cancer ([place])    basal cell cancer on face removed; Dr Ronnald Ramp  . Fracture    right 4th and 5th metacarpal  . GERD (gastroesophageal reflux disease)   . Hearing aid worn    B/L  . HOH (hard of hearing)   . Hyperlipidemia   . Hypothyroidism   . Osteopenia after menopause   . Pneumonia   . Wears glasses     Past Surgical History:  Procedure Laterality Date  . COLONOSCOPY  2012   neg; [facility]  . HEMORRHOID SURGERY    . OPEN REDUCTION INTERNAL FIXATION (ORIF) METACARPAL Right 04/10/2020   Procedure: OPEN REDUCTION INTERNAL FIXATION (ORIF)  AS NECESSARY RIGHT FOURTH AND FIFTH METACARPAL FRACTURES;  Surgeon: [name], MD;  Location: [facility];  Service: Orthopedics;  Laterality: Right;  OPEN REDUCTION INTERNAL FIXATION (ORIF)  AS  NECESSARY RIGHT FOURTH AND FIFTH METACARPAL FRACTURES    There were no vitals filed for this visit.   Subjective Assessment - 02/05/21 1235    Subjective No new complaitns. No falls to report. Does want to continue for 4 more weeks.    Pertinent History PMH of vertigo, GERD, macular degeneration, significant degenerative changes and anteriorlistesis    Diagnostic tests MRI imaging shows Moderate levoscoliosis of lumbar spine is noted. Grade 1  anterolisthesis of L4-5 is noted secondary to posterior facet joint  hypertrophy. Mild grade 1 retrolisthesis of L3-4 is noted secondary  to moderate degenerative disc disease at this level. Severe  degenerative disc disease is noted at L1-2 and L2-3 with anterior  osteophyte formation. No fracture is noted.  IMPRESSION:  Multilevel degenerative disc disease.    Patient Stated Goals To improve balance; to help be less stiff with back pain    Currently in Pain? No/denies    Pain Score 0-No pain          Treatment: Neuro re-ed: sensory organization test performed with following results: Conditions: 1: all above 2: all above 3: all above 4: 2 below, 1 above 5: all falls 6: all falls Composite score: 46 Sensory Analysis Som: above Vis: below Vest: significantly below  Pref: above Strategy analysis: trending toward no dominance with slight ankle preference still noted     COG alignment: left posterior           [facility] Adult PT Treatment/Exercise - 02/05/21 1236      Transfers   Transfers Sit to Stand;Stand to Sit    Sit to Stand 6: Modified independent (Device/Increase time);Without upper extremity assist;From chair/3-in-1    Stand to Sit 6: Modified independent (Device/Increase time);To chair/3-in-1;With upper extremity assist      Ambulation/Gait   Ambulation/Gait Yes    Ambulation/Gait Assistance 5: Supervision    Ambulation/Gait Assistance Details around clinic with session    Assistive device None    Gait Pattern Step-through  pattern;Wide base of support;Lateral trunk lean to right;Lateral trunk lean to left    Ambulation Surface Level;Indoor      Neuro Re-ed    Neuro Re-ed Details  forNMR/balance training: with single UE support in wide staggered stance working on weight shifting fwd/bwd, progressing to single stance leg with large forward/backward stepping for ~10 reps each on bil LE's.                    PT Short Term Goals - 01/27/21 1322      PT SHORT TERM GOAL #1   Title Pt will be independent with HEP for improved strength, balance, transfers, and gait.  TARGET 01/22/2021    Baseline 01/27/21: met with current HEP. updated this date as well    Status Achieved      PT SHORT TERM GOAL #2   Title Pt will improve 5x sit<>stand to less than or equal to 13 sec to demonstrate improved functional strength and transfer efficiency.    Baseline 01/20/21:  14.5 sec's (improved from 16.75 sec's)    Status Partially Met      PT SHORT TERM GOAL #3   Title Pt will improve FGA score to at least 18/30 to decrease fall risk.    Baseline 01/20/21: 16/30, improved from 13/30 just not to goal level    Status Partially Met      PT SHORT TERM GOAL #4   Title Pt will be able to stand with Eyes closed on compliant surface for at least 15 seconds to demo improved vestibular system function for balance.    Baseline 01/20/21:  met this date    Status Achieved      PT SHORT TERM GOAL #5   Title Pt will verbalize understanding of fall prevention in home environment.    Baseline 01/27/21: met in session today    Time 4    Period Weeks    Status On-going      PT SHORT TERM GOAL #6   Title Pt will verbalize/demo understanding of posture/positioning to decreased low back pain with functional mobility.  TARGET 01/22/2021    Baseline 01/27/21: met to date with sleeping, sitting and standing    Status Achieved             PT Long Term Goals - 02/05/21 1541      PT LONG TERM GOAL #1   Title Pt will be independent with  progression of HEP for improved strength, back pain, balance, transfers, and gait.  TARGET  for all LTGs 02/19/2021    Baseline 02/05/21: has current program, will benefit from advancements as pt  progresses    Status Partially Met      PT LONG TERM GOAL #2   Title Pt will improve  FGA score to at least 22/30 to decrease fall risk.    Baseline 02/03/21: 20/30 scored today, improved from 16/30 just not to goal    Status Partially Met      PT LONG TERM GOAL #3   Title Pt will stand eyes closed on compliant surface x 30 seconds to demo improved vestibular system use for balance.    Baseline 02/03/21: took pt several atttempts with 10-30 second holds (30 sec's x1 after several tries)    Status Partially Met      PT LONG TERM GOAL #4   Title Pt will ambulate at least 1000 ft indoor and outdoor surfaces, using appropriate assistive device, mod I and no LOB, for improved community gait.    Baseline 02/03/21: met distance with use of cane with min guard to supervision assist due to veering, poor sequencing with cane with imbalance when trying to correct sequencing.    Status Partially Met      PT LONG TERM GOAL #5   Title Pt will improve SOT composite score to at least 50 for improved overall balance.    Baseline 02/05/21: increased to 46 from 41, not to goal    Status Partially Met      PT LONG TERM GOAL #6   Title Oswestry Back pain questionaire to improve to less than or equal to 15% disability, to demo improved overall back pain with functional activities.    Baseline 02/03/21: pt scored 16% disability today    Status Partially Met      PT LONG TERM GOAL #7   Title Pt will report at least 50% improvement in back pain during morning hours, to improve overall functional mobility with decreased pain.    Baseline 02/03/21:Pt reports with use of pillows her pain has greatly improved. She can walk upright as soon as she gets up where before she could not. Still looking for the perfect pillow.    Status  Achieved                 Plan - 02/05/21 1236    Clinical Impression Statement Today's skilled session focused on progress toward remaining LTGs for recert to continue to address balance training. Pt did show progress on her SOT with improved composite score to 46 from 41. Pt's HEP remains appropriate with staggered stance weight shifting added. The pt is progressing toward goals and should benefit from continued PT to progress toward unmet goals.    Personal Factors and Comorbidities Comorbidity 3+    Comorbidities high cholesterol, thyroid, anxiety, fractures R hand and ribs    Examination-Activity Limitations Locomotion Level;Transfers;Stand;Caring for Others    Examination-Participation Restrictions Community Activity    Stability/Clinical Decision Making Evolving/Moderate complexity    Rehab Potential Good    PT Frequency 3x / week    PT Duration 8 weeks   3x/wk to start week of 10/13/3660 per recert 05/09/7653   PT Treatment/Interventions ADLs/Self Care Home Management;Gait training;Stair training;Functional mobility training;Therapeutic activities;Therapeutic exercise;Balance training;DME Instruction;Neuromuscular re-education;Patient/family education;Vestibular;Moist Heat;Traction;Ultrasound;Electrical Stimulation;Manual techniques    PT Next Visit Plan try gait again with cane (on outdoor surfaces if possible).   FOR BACK:  update HEP for flexibility/stability; review posture and positioning education (including getting out of bed, in/out of car), manual traction, lumbar flexibility, lumbar/abdominal stability;  FOR BALANCE:  Continue to work on balance strategies, varied surfaces, vision removed for balance/vestibular system retraining, core and proximal hip strengthening to improve balance with single limb stance, stepping up/down curbs/stairs, turning- needs  to work on stepping strategies as well    PT Home Exercise Plan Access Code: [email or web address]    Consulted and Agree with Plan of Care  Patient           Patient will benefit from skilled therapeutic intervention in order to improve the following deficits and impairments:  Abnormal gait,Difficulty walking,Decreased balance,Decreased mobility,Decreased strength  Visit Diagnosis: Other abnormalities of gait and mobility  Unsteadiness on feet  Muscle weakness (generalized)     Problem List Patient Active Problem List   Diagnosis Date Noted  . Acute left-sided low back pain without sciatica 08/06/2018  . Anxiety 07/11/2017  . Hypothyroidism 02/07/2015  . Arthralgia of multiple joints 05/28/2014  . Vitamin D deficiency 05/09/2012  . Osteopenia 05/26/2010  . Hypersomnia 02/22/2010  . Hyperlipidemia 01/21/2010  . Diverticulosis of large intestine 01/21/2010  . 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 SKIN CANCER, HX OF 01/21/2010    Willow Ora, PTA, [facility] [address], [place], [place] [address] [phone] 02/05/21, 3:45 PM   Name: Audrey [name] MRN: [id number] Date of Birth: [birth year]   For Recert:   PT End of Session - 02/09/21 1015    Visit Number 15    Number of Visits 23   per recert addendum 4/0/3474   Date for PT Re-Evaluation 25/95/63   per recert, 4 weeks   Authorization Type [employer]-needs to be resubmitted after 02/05/2021    Authorization Time Period 24 visits from 4/21 - 6/3    Authorization - Visit Number 26    Authorization - Number of Visits 24    Progress Note Due on Visit 20    Equipment Utilized During Treatment Gait belt    Activity Tolerance Patient tolerated treatment well    Behavior During Therapy [facility] for tasks assessed/performed           PT Long Term Goals - 02/09/21 1022      PT LONG TERM GOAL #1   Title Pt will be independent with progression of HEP for improved strength, back pain, balance, transfers, and gait.  TARGET  for all LTGs 03/05/2021    Baseline 02/05/21: has current program, will benefit from  advancements as pt  progresses    Time 4    Period Weeks    Status On-going      PT LONG TERM GOAL #2   Title Pt will improve FGA score to at least 22/30 to decrease fall risk.    Baseline 02/03/21: 20/30 scored today, improved from 16/30 just not to goal    Time 4    Period Weeks    Status On-going      PT LONG TERM GOAL #3   Title Pt will stand eyes closed on compliant surface x 30 seconds to demo improved vestibular system use for balance.    Baseline 02/03/21: took pt several atttempts with 10-30 second holds (30 sec's x1 after several tries)    Time 4    Period Weeks    Status On-going      PT LONG TERM GOAL #4   Title Pt will ambulate at least 1000 ft indoor and outdoor surfaces, using appropriate assistive device, mod I and no LOB, for improved community gait.    Baseline 02/03/21: met distance with use of cane with min guard to supervision assist due to veering, poor sequencing with cane with imbalance when trying to correct sequencing.    Time 4    Period  Weeks    Status On-going      PT LONG TERM GOAL #5   Title Pt will improve SOT composite score to at least 50 for improved overall balance.    Baseline 02/05/21: increased to 46 from 41, not to goal    Time 4    Period Weeks    Status On-going      PT LONG TERM GOAL #6   Title Oswestry Back pain questionaire to improve to less than or equal to 10% disability, to demo improved overall back pain with functional activities.    Baseline 02/03/21: pt scored 16% disability today    Time 4    Status Revised            02/05/21 1236  Plan  Clinical Impression Statement Today's skilled session focused on progress toward remaining LTGs for recert to continue to address balance training. Pt did show progress on her SOT with improved composite score to 46 from 41. Pt's HEP remains appropriate with staggered stance weight shifting added. The pt is progressing toward goals and should benefit from continued PT to progress toward unmet  goals.  AWM 09/10/1094 recert addendum:  Pt demonstrating steady progress with both balance and decreased back pain.  She remains at fall risk and will continue to benefit from skilled PT to address balance, strength, gait, back pain, posture, for overall improved functional mobility and decreased fall risk.  See recert and updated LTGs.  Personal Factors and Comorbidities Comorbidity 3+  Comorbidities high cholesterol, thyroid, anxiety, fractures R hand and ribs  Examination-Activity Limitations Locomotion Level;Transfers;Stand;Caring for Others  Examination-Participation Restrictions Community Activity  Pt will benefit from skilled therapeutic intervention in order to improve on the following deficits Abnormal gait;Difficulty walking;Decreased balance;Decreased mobility;Decreased strength  Stability/Clinical Decision Making Evolving/Moderate complexity  Rehab Potential Good  PT Frequency 2x / week  PT Duration 4 weeks (per recert 0/12/5407)  PT Treatment/Interventions ADLs/Self Care Home Management;Gait training;Stair training;Functional mobility training;Therapeutic activities;Therapeutic exercise;Balance training;DME Instruction;Neuromuscular re-education;Patient/family education;Vestibular;Moist Heat;Traction;Ultrasound;Electrical Stimulation;Manual techniques  PT Next Visit Plan try gait again with cane (on outdoor surfaces if possible).   FOR BACK:  update HEP for flexibility/stability; review posture and positioning education (including getting out of bed, in/out of car), manual traction, lumbar flexibility, lumbar/abdominal stability;  FOR BALANCE:  Continue to work on balance strategies, varied surfaces, vision removed for balance/vestibular system retraining, core and proximal hip strengthening to improve balance with single limb stance, stepping up/down curbs/stairs, turning- needs to work on stepping strategies as well  PT Home Exercise Plan Access Code: [email or web address]  Consulted and Agree with  Plan of Care Patient   Mady Haagensen, PT 02/09/21 10:27 AM Phone: [phone] Fax: [phone]

## 2021-02-09 ENCOUNTER — Other Ambulatory Visit: Payer: Self-pay

## 2021-02-09 ENCOUNTER — Ambulatory Visit: Payer: Medicare PPO

## 2021-02-09 DIAGNOSIS — M6281 Muscle weakness (generalized): Secondary | ICD-10-CM

## 2021-02-09 DIAGNOSIS — R2689 Other abnormalities of gait and mobility: Secondary | ICD-10-CM

## 2021-02-09 DIAGNOSIS — G8929 Other chronic pain: Secondary | ICD-10-CM

## 2021-02-09 DIAGNOSIS — R2681 Unsteadiness on feet: Secondary | ICD-10-CM

## 2021-02-09 DIAGNOSIS — M545 Low back pain, unspecified: Secondary | ICD-10-CM

## 2021-02-09 DIAGNOSIS — R293 Abnormal posture: Secondary | ICD-10-CM | POA: Diagnosis not present

## 2021-02-09 NOTE — Therapy (Signed)
[facility] [address] [place], [place], [address] Phone: [phone]   Fax:  [phone]  Physical Therapy Treatment  Patient Details  Name: Audrey [name] MRN: [id number] Date of Birth: 25-Mar-1946 Referring Provider (PT): Hal Hope Tamala Julian); cc'd to [facility], [facility]   Encounter Date: 02/09/2021   PT End of Session - 02/09/21 1159    Visit Number 16    Number of Visits 23   per recert addendum 09/13/2991   Date for PT Re-Evaluation 71/69/67   per recert, 4 weeks   Authorization Type [employer]-needs to be resubmitted after 02/05/2021    Authorization Time Period 24 visits from 4/21 - 6/3    Authorization - Visit Number 15    Authorization - Number of Visits 24    Progress Note Due on Visit 20    PT Start Time 1150    PT Stop Time 1230    PT Time Calculation (min) 40 min    Equipment Utilized During Treatment Gait belt    Activity Tolerance Patient tolerated treatment well    Behavior During Therapy [facility] for tasks assessed/performed           Past Medical History:  Diagnosis Date  . Allergy    seasonal allegies; seem worse this year  . Arthritis   . Cancer ([place])    basal cell cancer on face removed; Dr Ronnald Ramp  . Fracture    right 4th and 5th metacarpal  . GERD (gastroesophageal reflux disease)   . Hearing aid worn    B/L  . HOH (hard of hearing)   . Hyperlipidemia   . Hypothyroidism   . Osteopenia after menopause   . Pneumonia   . Wears glasses     Past Surgical History:  Procedure Laterality Date  . COLONOSCOPY  2012   neg; [facility]  . HEMORRHOID SURGERY    . OPEN REDUCTION INTERNAL FIXATION (ORIF) METACARPAL Right 04/10/2020   Procedure: OPEN REDUCTION INTERNAL FIXATION (ORIF)  AS NECESSARY RIGHT FOURTH AND FIFTH METACARPAL FRACTURES;  Surgeon: [name], MD;  Location: [facility];  Service: Orthopedics;  Laterality: Right;  OPEN REDUCTION INTERNAL FIXATION (ORIF)  AS NECESSARY RIGHT FOURTH  AND FIFTH METACARPAL FRACTURES    There were no vitals filed for this visit.   Subjective Assessment - 02/09/21 1224    Subjective Pt reports back bothers her when she getting out of bed or when she is sitting to standing. when she is on her feet for long time.    Pertinent History PMH of vertigo, GERD, macular degeneration, significant degenerative changes and anteriorlistesis    Diagnostic tests MRI imaging shows Moderate levoscoliosis of lumbar spine is noted. Grade 1  anterolisthesis of L4-5 is noted secondary to posterior facet joint  hypertrophy. Mild grade 1 retrolisthesis of L3-4 is noted secondary  to moderate degenerative disc disease at this level. Severe  degenerative disc disease is noted at L1-2 and L2-3 with anterior  osteophyte formation. No fracture is noted.  IMPRESSION:  Multilevel degenerative disc disease.    Patient Stated Goals To improve balance; to help be less stiff with back pain    Currently in Pain? Yes    Pain Location Back              Manual therapy; Soft tisssue mobilization around L4-5 paraspinalis and multifidus on R side Movement with mobilization: grade I-II uni PA mobilization on R side with patient in left sidelying and performing "open book" stretch:  3 x 5, varying twist from pelvis with knees together and R knee in front of L leg (increased twist) Gait training: 1 x 240' with cues to take smaller steps and increase heel to toe pattern. Open book: thoracolumbar twist: 10x Pt educated on log roll technique for sit to stand: practiced 2x                          PT Short Term Goals - 02/09/21 1020      PT SHORT TERM GOAL #1   Title STGs= LTGs             PT Long Term Goals - 02/09/21 1022      PT LONG TERM GOAL #1   Title Pt will be independent with progression of HEP for improved strength, back pain, balance, transfers, and gait.  TARGET  for all LTGs 03/05/2021    Baseline 02/05/21: has current program, will benefit  from advancements as pt  progresses    Time 4    Period Weeks    Status On-going      PT LONG TERM GOAL #2   Title Pt will improve FGA score to at least 22/30 to decrease fall risk.    Baseline 02/03/21: 20/30 scored today, improved from 16/30 just not to goal    Time 4    Period Weeks    Status On-going      PT LONG TERM GOAL #3   Title Pt will stand eyes closed on compliant surface x 30 seconds to demo improved vestibular system use for balance.    Baseline 02/03/21: took pt several atttempts with 10-30 second holds (30 sec's x1 after several tries)    Time 4    Period Weeks    Status On-going      PT LONG TERM GOAL #4   Title Pt will ambulate at least 1000 ft indoor and outdoor surfaces, using appropriate assistive device, mod I and no LOB, for improved community gait.    Baseline 02/03/21: met distance with use of cane with min guard to supervision assist due to veering, poor sequencing with cane with imbalance when trying to correct sequencing.    Time 4    Period Weeks    Status On-going      PT LONG TERM GOAL #5   Title Pt will improve SOT composite score to at least 50 for improved overall balance.    Baseline 02/05/21: increased to 46 from 41, not to goal    Time 4    Period Weeks    Status On-going      PT LONG TERM GOAL #6   Title Oswestry Back pain questionaire to improve to less than or equal to 10% disability, to demo improved overall back pain with functional activities.    Baseline 02/03/21: pt scored 16% disability today    Time 4    Status Revised                 Plan - 02/09/21 1228    Clinical Impression Statement Pt reponsed well with manual therapy and exercises for her back pain. Patient tends to have excessive BOS and longer step length. Pt educated to take smaller steps and more heel to toe pattern. Pt was upgraded exercise to mobilize back with rotation.    Personal Factors and Comorbidities Comorbidity 3+    Comorbidities high cholesterol, thyroid,  anxiety, fractures R hand and ribs  Examination-Activity Limitations Locomotion Level;Transfers;Stand;Caring for Others    Examination-Participation Restrictions Community Activity    Stability/Clinical Decision Making Evolving/Moderate complexity    Rehab Potential Good    PT Frequency 2x / week    PT Duration 4 weeks   per recert 11/08/6859   PT Treatment/Interventions ADLs/Self Care Home Management;Gait training;Stair training;Functional mobility training;Therapeutic activities;Therapeutic exercise;Balance training;DME Instruction;Neuromuscular re-education;Patient/family education;Vestibular;Moist Heat;Traction;Ultrasound;Electrical Stimulation;Manual techniques    PT Next Visit Plan try gait again with cane (on outdoor surfaces if possible).   FOR BACK:  update HEP for flexibility/stability; review posture and positioning education (including getting out of bed, in/out of car), manual traction, lumbar flexibility, lumbar/abdominal stability;  FOR BALANCE:  Continue to work on balance strategies, varied surfaces, vision removed for balance/vestibular system retraining, core and proximal hip strengthening to improve balance with single limb stance, stepping up/down curbs/stairs, turning- needs to work on stepping strategies as well    PT Home Exercise Plan Access Code: [email or web address]    Consulted and Agree with Plan of Care Patient           Patient will benefit from skilled therapeutic intervention in order to improve the following deficits and impairments:  Abnormal gait,Difficulty walking,Decreased balance,Decreased mobility,Decreased strength  Visit Diagnosis: Other abnormalities of gait and mobility  Unsteadiness on feet  Muscle weakness (generalized)  Chronic right-sided low back pain without sciatica  Abnormal posture     Problem List Patient Active Problem List   Diagnosis Date Noted  . Acute left-sided low back pain without sciatica 08/06/2018  . Anxiety 07/11/2017  .  Hypothyroidism 02/07/2015  . Arthralgia of multiple joints 05/28/2014  . Vitamin D deficiency 05/09/2012  . Osteopenia 05/26/2010  . Hypersomnia 02/22/2010  . Hyperlipidemia 01/21/2010  . Diverticulosis of large intestine 01/21/2010  . 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 SKIN CANCER, HX OF 01/21/2010    Kerrie Pleasure, PT 02/09/2021, 12:29 PM  [facility] [address] [place], [place], [address] Phone: [phone]   Fax:  [phone]  Name: Audrey [name] MRN: [id number] Date of Birth: 1945-11-21

## 2021-02-09 NOTE — Addendum Note (Signed)
Addended by: Frazier Butt on: 02/09/2021 10:30 AM   Modules accepted: Orders

## 2021-02-11 ENCOUNTER — Ambulatory Visit: Payer: Medicare PPO | Admitting: Physical Therapy

## 2021-02-16 ENCOUNTER — Other Ambulatory Visit: Payer: Self-pay

## 2021-02-16 ENCOUNTER — Encounter: Payer: Self-pay | Admitting: Physical Therapy

## 2021-02-16 ENCOUNTER — Ambulatory Visit: Payer: Medicare PPO | Admitting: Physical Therapy

## 2021-02-16 DIAGNOSIS — R293 Abnormal posture: Secondary | ICD-10-CM | POA: Diagnosis not present

## 2021-02-16 DIAGNOSIS — R2689 Other abnormalities of gait and mobility: Secondary | ICD-10-CM | POA: Diagnosis not present

## 2021-02-16 DIAGNOSIS — R2681 Unsteadiness on feet: Secondary | ICD-10-CM

## 2021-02-16 DIAGNOSIS — M6281 Muscle weakness (generalized): Secondary | ICD-10-CM | POA: Diagnosis not present

## 2021-02-16 DIAGNOSIS — M545 Low back pain, unspecified: Secondary | ICD-10-CM | POA: Diagnosis not present

## 2021-02-16 DIAGNOSIS — G8929 Other chronic pain: Secondary | ICD-10-CM | POA: Diagnosis not present

## 2021-02-16 NOTE — Therapy (Signed)
[facility] [address] [place], [place], [address] Phone: [phone]   Fax:  [phone]  Physical Therapy Treatment  Patient Details  Name: Audrey [name] MRN: [id number] Date of Birth: 13-Feb-1946 Referring Provider (PT): Hal Hope Tamala Julian); cc'd to Cleta Alberts, [facility]   Encounter Date: 02/16/2021   PT End of Session - 02/16/21 1226     Visit Number 17    Number of Visits 23   per recert addendum 03/08/813   Date for PT Re-Evaluation 48/18/56   per recert, 4 weeks   Authorization Type [employer]    Authorization Time Period 24 visits from 4/21 - 7/19    Authorization - Visit Number 64    Authorization - Number of Visits 24    Progress Note Due on Visit 20    PT Start Time 1229    PT Stop Time 1312    PT Time Calculation (min) 43 min    Equipment Utilized During Treatment Gait belt    Activity Tolerance Patient tolerated treatment well    Behavior During Therapy WFL for tasks assessed/performed             Past Medical History:  Diagnosis Date   Allergy    seasonal allegies; seem worse this year   Arthritis    Cancer ([place])    basal cell cancer on face removed; Dr Ronnald Ramp   Fracture    right 4th and 5th metacarpal   GERD (gastroesophageal reflux disease)    Hearing aid worn    B/L   HOH (hard of hearing)    Hyperlipidemia    Hypothyroidism    Osteopenia after menopause    Pneumonia    Wears glasses     Past Surgical History:  Procedure Laterality Date   COLONOSCOPY  2012   neg; [facility]   HEMORRHOID SURGERY     OPEN REDUCTION INTERNAL FIXATION (ORIF) METACARPAL Right 04/10/2020   Procedure: OPEN REDUCTION INTERNAL FIXATION (ORIF)  AS NECESSARY RIGHT FOURTH AND FIFTH METACARPAL FRACTURES;  Surgeon: [name], MD;  Location: [facility];  Service: Orthopedics;  Laterality: Right;  OPEN REDUCTION INTERNAL FIXATION (ORIF)  AS NECESSARY RIGHT FOURTH AND FIFTH METACARPAL FRACTURES    There were  no vitals filed for this visit.   Subjective Assessment - 02/16/21 1226     Subjective Have had a rough last few days, with grandchildren, was busier than usual.  It feels better today.    Pertinent History PMH of vertigo, GERD, macular degeneration, significant degenerative changes and anteriorlistesis    Diagnostic tests MRI imaging shows Moderate levoscoliosis of lumbar spine is noted. Grade 1  anterolisthesis of L4-5 is noted secondary to posterior facet joint  hypertrophy. Mild grade 1 retrolisthesis of L3-4 is noted secondary  to moderate degenerative disc disease at this level. Severe  degenerative disc disease is noted at L1-2 and L2-3 with anterior  osteophyte formation. No fracture is noted.  IMPRESSION:  Multilevel degenerative disc disease.    Patient Stated Goals To improve balance; to help be less stiff with back pain    Currently in Pain? No/denies    Pain Score 0-No pain                               [facility] Adult PT Treatment/Exercise - 02/16/21 0001       Transfers   Transfers Sit to Stand;Stand to Sit    Sit  to Stand 6: Modified independent (Device/Increase time);Without upper extremity assist;From chair/3-in-1    Stand to Sit 6: Modified independent (Device/Increase time);To chair/3-in-1;With upper extremity assist      Ambulation/Gait   Ambulation/Gait Yes    Ambulation/Gait Assistance 5: Supervision    Ambulation Distance (Feet) 230 Feet    Assistive device None    Gait Comments Negotiating compliant/unlevel surfaces on level ground in gym:  Forward short distance gait, with added stop/starts, then added head turns, cues for upright posture as needed to regain balance. Min guard/min assist briefly throughout the activity.                 Balance Exercises - 02/16/21 0001       Balance Exercises: Standing   Balance Beam Alt forward kicks x 10 reps, alt forward step taps x 10 reps, then alt taps to single cone x 10, triple cones x 10,  with BUE support.  Side step taps x 10 reps, BUE support (light).  Side step together, R and L, 10 reps    Sidestepping Foam/compliant support;5 reps;Limitations    Sidestepping Limitations On balance beam 2 reps BUE support, then 2 reps BUE support with head turns, then 2 rep BUE support and head turns min guard assist    Marching Foam/compliant surface;Upper extremity assist 2;Static;10 reps;Limitations    Marching Limitations 2 sets on foam, EO and EC; 3rd set with head turns    Other Standing Exercises On compliant ramp incline and decline:  feet apart EO and EC with head turns x 5 each, head nods x 5 reps each; then partial tandem stance EO and EC with head turns x 5 reps each, head nods x 5 reps each.  Min guard provided.  with light UE support at mirror and min guard assist:  marching in place x 10, forward step taps x 10.    Other Standing Exercises Comments On compliant ramp:  walking up and down, x 3 reps with cues for stop/start, min guard for stability.                 PT Short Term Goals - 02/09/21 1020       PT SHORT TERM GOAL #1   Title STGs= LTGs               PT Long Term Goals - 02/09/21 1022       PT LONG TERM GOAL #1   Title Pt will be independent with progression of HEP for improved strength, back pain, balance, transfers, and gait.  TARGET  for all LTGs 03/05/2021    Baseline 02/05/21: has current program, will benefit from advancements as pt  progresses    Time 4    Period Weeks    Status On-going      PT LONG TERM GOAL #2   Title Pt will improve FGA score to at least 22/30 to decrease fall risk.    Baseline 02/03/21: 20/30 scored today, improved from 16/30 just not to goal    Time 4    Period Weeks    Status On-going      PT LONG TERM GOAL #3   Title Pt will stand eyes closed on compliant surface x 30 seconds to demo improved vestibular system use for balance.    Baseline 02/03/21: took pt several atttempts with 10-30 second holds (30 sec's x1 after  several tries)    Time 4    Period Weeks    Status On-going        PT LONG TERM GOAL #4   Title Pt will ambulate at least 1000 ft indoor and outdoor surfaces, using appropriate assistive device, mod I and no LOB, for improved community gait.    Baseline 02/03/21: met distance with use of cane with min guard to supervision assist due to veering, poor sequencing with cane with imbalance when trying to correct sequencing.    Time 4    Period Weeks    Status On-going      PT LONG TERM GOAL #5   Title Pt will improve SOT composite score to at least 50 for improved overall balance.    Baseline 02/05/21: increased to 46 from 41, not to goal    Time 4    Period Weeks    Status On-going      PT LONG TERM GOAL #6   Title Oswestry Back pain questionaire to improve to less than or equal to 10% disability, to demo improved overall back pain with functional activities.    Baseline 02/03/21: pt scored 16% disability today    Time 4    Status Revised                   Plan - 02/16/21 1313     Clinical Impression Statement Focus of skilled PT session today is high level balance and vestibular training on unlevel, compliant, unpredictable surfaces.  On incline/decline, pt needs occasional UE support for balance.  She responds well to cues for confidence, upright posture with stop/start activity to be better balanced.  She would benefit from continued skilled PT to further reinforce balance exercises for improved balance and decreased fall risk.    Personal Factors and Comorbidities Comorbidity 3+    Comorbidities high cholesterol, thyroid, anxiety, fractures R hand and ribs    Examination-Activity Limitations Locomotion Level;Transfers;Stand;Caring for Others    Examination-Participation Restrictions Community Activity    Stability/Clinical Decision Making Evolving/Moderate complexity    Rehab Potential Good    PT Frequency 2x / week    PT Duration 4 weeks   per recert 02/05/2021   PT  Treatment/Interventions ADLs/Self Care Home Management;Gait training;Stair training;Functional mobility training;Therapeutic activities;Therapeutic exercise;Balance training;DME Instruction;Neuromuscular re-education;Patient/family education;Vestibular;Moist Heat;Traction;Ultrasound;Electrical Stimulation;Manual techniques    PT Next Visit Plan try gait again with cane (on outdoor surfaces if possible).   FOR BACK:  update HEP for flexibility/stability; review posture and positioning education (including getting out of bed, in/out of car), manual traction, lumbar flexibility, lumbar/abdominal stability;  FOR BALANCE:  Continue to work on balance strategies, varied surfaces, including ramp vision removed for balance/vestibular system retraining, core and proximal hip strengthening to improve balance with single limb stance, stepping up/down curbs/stairs, turning- needs to work on stepping strategies as well    PT Home Exercise Plan Access Code: [email or web address]    Consulted and Agree with Plan of Care Patient             Patient will benefit from skilled therapeutic intervention in order to improve the following deficits and impairments:  Abnormal gait, Difficulty walking, Decreased balance, Decreased mobility, Decreased strength  Visit Diagnosis: Unsteadiness on feet  Other abnormalities of gait and mobility     Problem List Patient Active Problem List   Diagnosis Date Noted   Acute left-sided low back pain without sciatica 08/06/2018   Anxiety 07/11/2017   Hypothyroidism 02/07/2015   Arthralgia of multiple joints 05/28/2014   Vitamin D deficiency 05/09/2012   Osteopenia 05/26/2010   Hypersomnia 02/22/2010   Hyperlipidemia 01/21/2010   Diverticulosis   of large intestine 01/21/2010   Spondylosis, lumbar, with myelopathy 01/21/2010   SKIN CANCER, HX OF 01/21/2010    MARRIOTT,AMY W. 02/16/2021, 1:19 PM Amy Marriott, PT 02/16/21 1:20 PM Phone: [phone] Fax: [phone]  [facility] [address] [place], [place], [address] Phone: [phone]   Fax:  [phone]  Name: Florabelle B Bley MRN: [id number] Date of Birth: 08/27/1946    

## 2021-02-18 ENCOUNTER — Ambulatory Visit: Payer: Medicare PPO | Admitting: Physical Therapy

## 2021-02-18 ENCOUNTER — Other Ambulatory Visit: Payer: Self-pay

## 2021-02-18 ENCOUNTER — Encounter: Payer: Self-pay | Admitting: Physical Therapy

## 2021-02-18 DIAGNOSIS — M6281 Muscle weakness (generalized): Secondary | ICD-10-CM

## 2021-02-18 DIAGNOSIS — R2681 Unsteadiness on feet: Secondary | ICD-10-CM | POA: Diagnosis not present

## 2021-02-18 DIAGNOSIS — R2689 Other abnormalities of gait and mobility: Secondary | ICD-10-CM | POA: Diagnosis not present

## 2021-02-18 DIAGNOSIS — R293 Abnormal posture: Secondary | ICD-10-CM | POA: Diagnosis not present

## 2021-02-18 DIAGNOSIS — M545 Low back pain, unspecified: Secondary | ICD-10-CM | POA: Diagnosis not present

## 2021-02-18 DIAGNOSIS — G8929 Other chronic pain: Secondary | ICD-10-CM | POA: Diagnosis not present

## 2021-02-19 NOTE — Therapy (Signed)
[facility] [address] [place], [place], [address] Phone: [phone]   Fax:  [phone]  Physical Therapy Treatment  Patient Details  Name: Audrey [name] MRN: [id number] Date of Birth: 01-21-1946 Referring Provider (PT): Hal Hope Tamala Julian); cc'd to Cleta Alberts, [facility]   Encounter Date: 02/18/2021   PT End of Session - 02/18/21 1108     Visit Number 18    Number of Visits 23   per recert addendum 0/0/7622   Date for PT Re-Evaluation 63/33/54   per recert, 4 weeks   Authorization Type [employer]    Authorization Time Period 24 visits from 4/21 - 7/19    Authorization - Visit Number 49    Authorization - Number of Visits 24    Progress Note Due on Visit 20    PT Start Time 1104    PT Stop Time 1145    PT Time Calculation (min) 41 min    Equipment Utilized During Treatment Gait belt    Activity Tolerance Patient tolerated treatment well    Behavior During Therapy WFL for tasks assessed/performed             Past Medical History:  Diagnosis Date   Allergy    seasonal allegies; seem worse this year   Arthritis    Cancer ([place])    basal cell cancer on face removed; Dr Ronnald Ramp   Fracture    right 4th and 5th metacarpal   GERD (gastroesophageal reflux disease)    Hearing aid worn    B/L   HOH (hard of hearing)    Hyperlipidemia    Hypothyroidism    Osteopenia after menopause    Pneumonia    Wears glasses     Past Surgical History:  Procedure Laterality Date   COLONOSCOPY  2012   neg; [facility]   HEMORRHOID SURGERY     OPEN REDUCTION INTERNAL FIXATION (ORIF) METACARPAL Right 04/10/2020   Procedure: OPEN REDUCTION INTERNAL FIXATION (ORIF)  AS NECESSARY RIGHT FOURTH AND FIFTH METACARPAL FRACTURES;  Surgeon: [name], MD;  Location: [facility];  Service: Orthopedics;  Laterality: Right;  OPEN REDUCTION INTERNAL FIXATION (ORIF)  AS NECESSARY RIGHT FOURTH AND FIFTH METACARPAL FRACTURES    There were  no vitals filed for this visit.   Subjective Assessment - 02/18/21 1108     Subjective No new complaints. No falls or pain to report. Woke up this morning with pain at all.    Pertinent History PMH of vertigo, GERD, macular degeneration, significant degenerative changes and anteriorlistesis    Diagnostic tests MRI imaging shows Moderate levoscoliosis of lumbar spine is noted. Grade 1  anterolisthesis of L4-5 is noted secondary to posterior facet joint  hypertrophy. Mild grade 1 retrolisthesis of L3-4 is noted secondary  to moderate degenerative disc disease at this level. Severe  degenerative disc disease is noted at L1-2 and L2-3 with anterior  osteophyte formation. No fracture is noted.  IMPRESSION:  Multilevel degenerative disc disease.    Patient Stated Goals To improve balance; to help be less stiff with back pain    Currently in Pain? No/denies    Pain Score 0-No pain                   [facility] Adult PT Treatment/Exercise - 02/18/21 1110       Transfers   Transfers Sit to Stand;Stand to Sit    Sit to Stand 6: Modified independent (Device/Increase time);Without upper extremity assist;From chair/3-in-1  Stand to Sit 6: Modified independent (Device/Increase time);To chair/3-in-1;With upper extremity assist      Ambulation/Gait   Ambulation/Gait Yes    Ambulation/Gait Assistance 4: Min guard;4: Min assist    Ambulation/Gait Assistance Details min guard to min assist on outdoor complaint surfaces, otherwise supervision to min guard assist for tasks around clinic with session.    Ambulation Distance (Feet) 500 Feet   x1 in/outdoors,   Assistive device None    Gait Pattern Step-through pattern;Wide base of support;Lateral trunk lean to right;Lateral trunk lean to left    Ambulation Surface Level;Unlevel;Indoor;Outdoor;Paved;Gravel;Grass      Neuro Re-ed    Neuro Re-ed Details  for balanance/NMR: obstacle course blue mat with bean bags under<> stepping stones <> 3 small hurdles  for reciprocal stepping over <> red mat with bean bags underneath- 3 laps each direction on course with min guard to min assist. increased dificulty with negotiation over hurdles and stepping stones, no issues noted with mats. cues needed through out to stay up tall/not lean forward.                 Balance Exercises - 02/18/21 1125       Balance Exercises: Standing   Wall Bumps Hip;Eyes opened;Eyes closed;10 reps;Limitations    Wall Bumps Limitations on airex for 10 reps each EO, then EC. increased assist for balance/form with EC.    Rockerboard Anterior/posterior;Lateral;EO;EC;30 seconds;Other reps (comment);Limitations    Rockerboard Limitations on rockerboard in both ways- rocking the board with EO, then EC with emphasis on tall posture. then holding the board steady for EC 30 sec's x 3 reps. min guard to min assist for balance with occasional touch to bars, cues for posture and abdominal bracing to assist with balance                 PT Short Term Goals - 02/09/21 1020       PT SHORT TERM GOAL #1   Title STGs= LTGs               PT Long Term Goals - 02/09/21 1022       PT LONG TERM GOAL #1   Title Pt will be independent with progression of HEP for improved strength, back pain, balance, transfers, and gait.  TARGET  for all LTGs 03/05/2021    Baseline 02/05/21: has current program, will benefit from advancements as pt  progresses    Time 4    Period Weeks    Status On-going      PT LONG TERM GOAL #2   Title Pt will improve FGA score to at least 22/30 to decrease fall risk.    Baseline 02/03/21: 20/30 scored today, improved from 16/30 just not to goal    Time 4    Period Weeks    Status On-going      PT LONG TERM GOAL #3   Title Pt will stand eyes closed on compliant surface x 30 seconds to demo improved vestibular system use for balance.    Baseline 02/03/21: took pt several atttempts with 10-30 second holds (30 sec's x1 after several tries)    Time 4     Period Weeks    Status On-going      PT LONG TERM GOAL #4   Title Pt will ambulate at least 1000 ft indoor and outdoor surfaces, using appropriate assistive device, mod I and no LOB, for improved community gait.    Baseline 02/03/21: met distance with use of  cane with min guard to supervision assist due to veering, poor sequencing with cane with imbalance when trying to correct sequencing.    Time 4    Period Weeks    Status On-going      PT LONG TERM GOAL #5   Title Pt will improve SOT composite score to at least 50 for improved overall balance.    Baseline 02/05/21: increased to 46 from 41, not to goal    Time 4    Period Weeks    Status On-going      PT LONG TERM GOAL #6   Title Oswestry Back pain questionaire to improve to less than or equal to 10% disability, to demo improved overall back pain with functional activities.    Baseline 02/03/21: pt scored 16% disability today    Time 4    Status Revised                   Plan - 02/18/21 1109     Clinical Impression Statement Today's skilled session continued to focus on balance training on compliant surfaces and with vision removed at times. Pt continues to need cues on posture to help with balance The pt is making progress toward goals and should benefit from continued PT to progress toward unmet goals.    Personal Factors and Comorbidities Comorbidity 3+    Comorbidities high cholesterol, thyroid, anxiety, fractures R hand and ribs    Examination-Activity Limitations Locomotion Level;Transfers;Stand;Caring for Others    Examination-Participation Restrictions Community Activity    Stability/Clinical Decision Making Evolving/Moderate complexity    Rehab Potential Good    PT Frequency 2x / week    PT Duration 4 weeks   per recert 0/03/1218   PT Treatment/Interventions ADLs/Self Care Home Management;Gait training;Stair training;Functional mobility training;Therapeutic activities;Therapeutic exercise;Balance training;DME  Instruction;Neuromuscular re-education;Patient/family education;Vestibular;Moist Heat;Traction;Ultrasound;Electrical Stimulation;Manual techniques    PT Next Visit Plan on outdoor surfaces if possible.  FOR BACK:  update HEP for flexibility/stability; review posture and positioning education (including getting out of bed, in/out of car), manual traction, lumbar flexibility, lumbar/abdominal stability;  FOR BALANCE:  Continue to work on balance strategies, varied surfaces, including ramp vision removed for balance/vestibular system retraining, core and proximal hip strengthening to improve balance with single limb stance, stepping up/down curbs/stairs, turning- needs to work on stepping strategies as well    PT Home Exercise Plan Access Code: [email or web address]    Consulted and Agree with Plan of Care Patient             Patient will benefit from skilled therapeutic intervention in order to improve the following deficits and impairments:  Abnormal gait, Difficulty walking, Decreased balance, Decreased mobility, Decreased strength  Visit Diagnosis: Unsteadiness on feet  Other abnormalities of gait and mobility  Muscle weakness (generalized)     Problem List Patient Active Problem List   Diagnosis Date Noted   Acute left-sided low back pain without sciatica 08/06/2018   Anxiety 07/11/2017   Hypothyroidism 02/07/2015   Arthralgia of multiple joints 05/28/2014   Vitamin D deficiency 05/09/2012   Osteopenia 05/26/2010   Hypersomnia 02/22/2010   Hyperlipidemia 01/21/2010   Diverticulosis of large intestine 01/21/2010   Spondylosis, lumbar, with myelopathy 01/21/2010   SKIN CANCER, HX OF 01/21/2010    Willow Ora, PTA, [facility] [address] [place], [place] [address] [phone] 02/19/21, 3:55 PM   Name: Audrey [name] MRN: [id number] Date of Birth: 01-20-1946

## 2021-02-22 ENCOUNTER — Other Ambulatory Visit: Payer: Self-pay

## 2021-02-22 ENCOUNTER — Ambulatory Visit: Payer: Medicare PPO | Admitting: Physical Therapy

## 2021-02-22 DIAGNOSIS — G8929 Other chronic pain: Secondary | ICD-10-CM | POA: Diagnosis not present

## 2021-02-22 DIAGNOSIS — R2681 Unsteadiness on feet: Secondary | ICD-10-CM

## 2021-02-22 DIAGNOSIS — R2689 Other abnormalities of gait and mobility: Secondary | ICD-10-CM

## 2021-02-22 DIAGNOSIS — M6281 Muscle weakness (generalized): Secondary | ICD-10-CM | POA: Diagnosis not present

## 2021-02-22 DIAGNOSIS — M545 Low back pain, unspecified: Secondary | ICD-10-CM | POA: Diagnosis not present

## 2021-02-22 DIAGNOSIS — R293 Abnormal posture: Secondary | ICD-10-CM | POA: Diagnosis not present

## 2021-02-22 NOTE — Therapy (Signed)
[facility] [address] [place], [place], [address] Phone: [phone]   Fax:  [phone]  Physical Therapy Treatment  Patient Details  Name: Audrey [name] MRN: [id number] Date of Birth: [birth year] Referring Provider (PT): Hal Hope Tamala Julian); cc'd to Cleta Alberts, [facility]   Encounter Date: 02/22/2021   PT End of Session - 02/22/21 1227     Visit Number 19    Number of Visits 23   per recert addendum 02/09/2093   Date for PT Re-Evaluation 70/96/28   per recert, 4 weeks   Authorization Type [employer]    Authorization Time Period 24 visits from 4/21 - 7/19    Authorization - Visit Number 73    Authorization - Number of Visits 24    Progress Note Due on Visit 20    PT Start Time 1230    PT Stop Time 1311    PT Time Calculation (min) 41 min    Equipment Utilized During Treatment Gait belt    Activity Tolerance Patient tolerated treatment well    Behavior During Therapy WFL for tasks assessed/performed             Past Medical History:  Diagnosis Date   Allergy    seasonal allegies; seem worse this year   Arthritis    Cancer ([place])    basal cell cancer on face removed; Dr Ronnald Ramp   Fracture    right 4th and 5th metacarpal   GERD (gastroesophageal reflux disease)    Hearing aid worn    B/L   HOH (hard of hearing)    Hyperlipidemia    Hypothyroidism    Osteopenia after menopause    Pneumonia    Wears glasses     Past Surgical History:  Procedure Laterality Date   COLONOSCOPY  2012   neg; [facility]   HEMORRHOID SURGERY     OPEN REDUCTION INTERNAL FIXATION (ORIF) METACARPAL Right 04/10/2020   Procedure: OPEN REDUCTION INTERNAL FIXATION (ORIF)  AS NECESSARY RIGHT FOURTH AND FIFTH METACARPAL FRACTURES;  Surgeon: [name], MD;  Location: [place];  Service: Orthopedics;  Laterality: Right;  OPEN REDUCTION INTERNAL FIXATION (ORIF)  AS NECESSARY RIGHT FOURTH AND FIFTH METACARPAL FRACTURES    There were  no vitals filed for this visit.   Subjective Assessment - 02/22/21 1227     Subjective No changes, no c/o.    Pertinent History PMH of vertigo, GERD, macular degeneration, significant degenerative changes and anteriorlistesis    Diagnostic tests MRI imaging shows Moderate levoscoliosis of lumbar spine is noted. Grade 1  anterolisthesis of L4-5 is noted secondary to posterior facet joint  hypertrophy. Mild grade 1 retrolisthesis of L3-4 is noted secondary  to moderate degenerative disc disease at this level. Severe  degenerative disc disease is noted at L1-2 and L2-3 with anterior  osteophyte formation. No fracture is noted.  IMPRESSION:  Multilevel degenerative disc disease.    Patient Stated Goals To improve balance; to help be less stiff with back pain    Currently in Pain? No/denies                [facility] PT Assessment - 02/22/21 0001       Functional Gait  Assessment   Gait assessed  Yes    Gait Level Surface Walks 20 ft, slow speed, abnormal gait pattern, evidence for imbalance or deviates 10-15 in outside of the 12 in walkway width. Requires more than 7 sec to ambulate 20 ft.   7.62  Change in Gait Speed Able to smoothly change walking speed without loss of balance or gait deviation. Deviate no more than 6 in outside of the 12 in walkway width.    Gait with Horizontal Head Turns Performs head turns smoothly with slight change in gait velocity (eg, minor disruption to smooth gait path), deviates 6-10 in outside 12 in walkway width, or uses an assistive device.    Gait with Vertical Head Turns Performs task with slight change in gait velocity (eg, minor disruption to smooth gait path), deviates 6 - 10 in outside 12 in walkway width or uses assistive device    Gait and Pivot Turn Pivot turns safely within 3 sec and stops quickly with no loss of balance.    Step Over Obstacle Is able to step over one shoe box (4.5 in total height) without changing gait speed. No evidence of imbalance.     Gait with Narrow Base of Support Ambulates less than 4 steps heel to toe or cannot perform without assistance.    Gait with Eyes Closed Walks 20 ft, slow speed, abnormal gait pattern, evidence for imbalance, deviates 10-15 in outside 12 in walkway width. Requires more than 9 sec to ambulate 20 ft.   14.65   Ambulating Backwards Walks 20 ft, uses assistive device, slower speed, mild gait deviations, deviates 6-10 in outside 12 in walkway width.   19.82   Steps Alternating feet, must use rail.    Total Score 18    FGA comment: Scores <22/30 indicate increased fall risk.                           [facility] PT Treatment/Exercise - 02/22/21 0001       Ambulation/Gait   Ambulation/Gait Yes    Ambulation/Gait Assistance 4: Min guard    Ambulation/Gait Assistance Details Outdoor surfaces using cane, attempted head motions for environmental scanning and pt has several episodes of veering and near LOB.  Cues several times to stop and reset.  Cues for cane placement/sequence.    Ambulation Distance (Feet) 1000 Feet    Assistive device Straight cane    Gait Pattern Step-through pattern;Wide base of support;Lateral trunk lean to right;Lateral trunk lean to left    Ambulation Surface Level;Indoor;Unlevel;Outdoor      Self-Care   Self-Care Other Self-Care Comments    Other Self-Care Comments  Reviewed fall prevention education, especially given pt's decrease from 20/30 to 18/30 on FGA.  Discussed pt's continued tendency for unsteadiness with gait and pt verbalizes understanding of use of exercises to help steadiness with gait. Discussed fluctuations in balance based on back pain and based on fatigue; again recommended used of cane for outdoor gait for improved stability with gait.  Pt feels she has what she needs to continue on her own at home and would like to discharge at the end of the week.  Discussed importance of continuing HEP upon d/c from PT.      Neuro Re-ed    Neuro Re-ed  Details  Attempted standing feet apart, EC on foam surface, x 2, < 30 seconds with posterior lean/min guard.             Reviewed more recent updates as part of HEP and pt return demo understanding.  Cues for UE support as needed to increase stability. Reviewed also standing lateral weigthshifting and anterior/posterior weightshifting to help lessen back pain/fatigue with longer distance gait.  Standing Near Stance in [place]  with Eyes Closed - 1 x daily - 5 x weekly - 1 sets - 3 reps - 30 hold Standing Balance in Corner with Eyes Closed - 1 x daily - 5 x weekly - 1 sets - 10 reps Wide Tandem Stance with Eyes Open - 1 x daily - 5 x weekly - 1 sets - 10 reps          PT Short Term Goals - 02/09/21 1020       PT SHORT TERM GOAL #1   Title STGs= LTGs               PT Long Term Goals - 02/22/21 1238       PT LONG TERM GOAL #1   Title Pt will be independent with progression of HEP for improved strength, back pain, balance, transfers, and gait.  TARGET  for all LTGs 03/05/2021    Baseline 02/05/21: has current program, will benefit from advancements as pt  progresses    Time 4    Period Weeks    Status On-going      PT LONG TERM GOAL #2   Title Pt will improve FGA score to at least 22/30 to decrease fall risk.    Baseline 02/03/21: 20/30 scored today, improved from 16/30 just not to goal; 18/30 02/22/21    Time 4    Period Weeks    Status Not Met      PT LONG TERM GOAL #3   Title Pt will stand eyes closed on compliant surface x 30 seconds to demo improved vestibular system use for balance.    Baseline 02/22/21:  : took pt several atttempts with 17 sec, 25 sec    Time 4    Period Weeks    Status On-going      PT LONG TERM GOAL #4   Title Pt will ambulate at least 1000 ft indoor and outdoor surfaces, using appropriate assistive device, mod I and no LOB, for improved community gait.    Baseline 02/22/21: met distance with use of cane with min guard to supervision assist due  to veering, poor sequencing with cane with imbalance when trying to correct sequencing.    Time 4    Period Weeks    Status Not Met      PT LONG TERM GOAL #5   Title Pt will improve SOT composite score to at least 50 for improved overall balance.    Baseline 02/05/21: increased to 46 from 41, not to goal    Time 4    Period Weeks    Status On-going      PT LONG TERM GOAL #6   Title Oswestry Back pain questionaire to improve to less than or equal to 10% disability, to demo improved overall back pain with functional activities.    Baseline 02/03/21: pt scored 16% disability today    Time 4    Status Revised                   Plan - 02/22/21 1503     Clinical Impression Statement Began assessed LTGs this visit, as pt reports she feels she is ready for d/c next visit.  Discussed that next visit is 20th visit and given the multiple exercises, education/compensation strategies we have discussed and practiced, even though pt remains at fall risk, PT feels discharge next visit is reasonable.  LTG 2 not met, with FGA score decreased from 20/30 to 18/30.  LTG 4 not met, with  pt ambulating distance of 1000 ft, but needing cane and at least min guard assist due to veering and unsteadiness at times.  Reviewed balance exercises and encouraged UE support for optimal stability; reviewed education for fall prevention.  Pt is aware of being at continued fall risk and verbalizes understanding.  Plan for assessing remaining LTGs next visit, with plans for d/c.    Personal Factors and Comorbidities Comorbidity 3+    Comorbidities high cholesterol, thyroid, anxiety, fractures R hand and ribs    Examination-Activity Limitations Locomotion Level;Transfers;Stand;Caring for Others    Examination-Participation Restrictions Community Activity    Stability/Clinical Decision Making Evolving/Moderate complexity    Rehab Potential Good    PT Frequency 2x / week    PT Duration 4 weeks   per recert 04/09/2777   PT  Treatment/Interventions ADLs/Self Care Home Management;Gait training;Stair training;Functional mobility training;Therapeutic activities;Therapeutic exercise;Balance training;DME Instruction;Neuromuscular re-education;Patient/family education;Vestibular;Moist Heat;Traction;Ultrasound;Electrical Stimulation;Manual techniques    PT Next Visit Plan Check remaining LTGs and plan for d/c next visit; review back ex and perform SOT; take remaining appts out if pt d/c later this week    PT Home Exercise Plan Access Code: [email or web address]    Consulted and Agree with Plan of Care Patient             Patient will benefit from skilled therapeutic intervention in order to improve the following deficits and impairments:  Abnormal gait, Difficulty walking, Decreased balance, Decreased mobility, Decreased strength  Visit Diagnosis: Other abnormalities of gait and mobility  Unsteadiness on feet     Problem List Patient Active Problem List   Diagnosis Date Noted   Acute left-sided low back pain without sciatica 08/06/2018   Anxiety 07/11/2017   Hypothyroidism 02/07/2015   Arthralgia of multiple joints 05/28/2014   Vitamin D deficiency 05/09/2012   Osteopenia 05/26/2010   Hypersomnia 02/22/2010   Hyperlipidemia 01/21/2010   Diverticulosis of large intestine 01/21/2010   Spondylosis, lumbar, 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 02/22/2021, 3:08 PM Frazier Butt., PT  [facility] [address] [place], [place], [address] Phone: [phone]   Fax:  [phone]  Name: Audrey [name] MRN: [id number] Date of Birth: 10-12-1945

## 2021-02-25 ENCOUNTER — Encounter: Payer: Self-pay | Admitting: Physical Therapy

## 2021-02-25 ENCOUNTER — Ambulatory Visit: Payer: Medicare PPO | Admitting: Physical Therapy

## 2021-02-25 ENCOUNTER — Other Ambulatory Visit: Payer: Self-pay

## 2021-02-25 DIAGNOSIS — R2681 Unsteadiness on feet: Secondary | ICD-10-CM

## 2021-02-25 DIAGNOSIS — G8929 Other chronic pain: Secondary | ICD-10-CM | POA: Diagnosis not present

## 2021-02-25 DIAGNOSIS — R293 Abnormal posture: Secondary | ICD-10-CM | POA: Diagnosis not present

## 2021-02-25 DIAGNOSIS — M6281 Muscle weakness (generalized): Secondary | ICD-10-CM | POA: Diagnosis not present

## 2021-02-25 DIAGNOSIS — R2689 Other abnormalities of gait and mobility: Secondary | ICD-10-CM

## 2021-02-25 DIAGNOSIS — M545 Low back pain, unspecified: Secondary | ICD-10-CM | POA: Diagnosis not present

## 2021-02-28 NOTE — Therapy (Addendum)
[facility] [address] [place], [place], [address] Phone: [phone]   Fax:  [phone]  Physical Therapy Treatment/Progress Note  Patient Details  Name: Audrey [name] MRN: [id number] Date of Birth: 10-15-1945 Referring Provider (PT): Hal Hope Tamala Julian); cc'd to [facility], [facility]  20th Visit progress note, covering 10 visits from 01/20/2021-03/01/2021.   Subjective reports:  Pt reports she feels that the tips from PT are helping, both her back and balance.  At times, she is reporting decreased pain, especially in the mornings. Objective measures:  FGA 20/30, improved from 16/30.  Decreased SOT scores, indicating decreased vestibular system use for balance.  Supervision with outdoor gait. Pt feels she is improving and is making slow, steady progress towards LTGs.  Plan to check LTGs next 1-2 visits with plans for d/c at that time.  Mady Haagensen, PT 03/01/21 1:12 PM Phone: [phone] Fax: [phone]   Encounter Date: 02/25/2021   02/25/21 1240  PT Visits / Re-Eval  Visit Number 20  Number of Visits 23 (per recert addendum 01/06/6643)  Date for PT Re-Evaluation 03/47/42 (per recert, 4 weeks)  Authorization  Authorization Type [employer]  Authorization Time Period 24 visits from 4/21 - 7/19  Authorization - Visit Number 3  Authorization - Number of Visits 24  Progress Note Due on Visit 30  PT Time Calculation  PT Start Time 1236  PT Stop Time 1315  PT Time Calculation (min) 39 min  PT - End of Session  Equipment Utilized During Treatment Gait belt  Activity Tolerance Patient tolerated treatment well  Behavior During Therapy WFL for tasks assessed/performed     Past Medical History:  Diagnosis Date   Allergy    seasonal allegies; seem worse this year   Arthritis    Cancer ([place])    basal cell cancer on face removed; Dr Ronnald Ramp   Fracture    right 4th and 5th metacarpal   GERD (gastroesophageal  reflux disease)    Hearing aid worn    B/L   HOH (hard of hearing)    Hyperlipidemia    Hypothyroidism    Osteopenia after menopause    Pneumonia    Wears glasses     Past Surgical History:  Procedure Laterality Date   COLONOSCOPY  2012   neg; [facility]   HEMORRHOID SURGERY     OPEN REDUCTION INTERNAL FIXATION (ORIF) METACARPAL Right 04/10/2020   Procedure: OPEN REDUCTION INTERNAL FIXATION (ORIF)  AS NECESSARY RIGHT FOURTH AND FIFTH METACARPAL FRACTURES;  Surgeon: [name], MD;  Location: [facility];  Service: Orthopedics;  Laterality: Right;  OPEN REDUCTION INTERNAL FIXATION (ORIF)  AS NECESSARY RIGHT FOURTH AND FIFTH METACARPAL FRACTURES    There were no vitals filed for this visit.     02/25/21 1239  Symptoms/Limitations  Subjective No new complaints. No falls. Some back pain this morning with getting up from bed, better now.  Pertinent History PMH of vertigo, GERD, macular degeneration, significant degenerative changes and anteriorlistesis  Diagnostic tests MRI imaging shows Moderate levoscoliosis of lumbar spine is noted. Grade 1  anterolisthesis of L4-5 is noted secondary to posterior facet joint  hypertrophy. Mild grade 1 retrolisthesis of L3-4 is noted secondary  to moderate degenerative disc disease at this level. Severe  degenerative disc disease is noted at L1-2 and L2-3 with anterior  osteophyte formation. No fracture is noted.  IMPRESSION:  Multilevel degenerative disc disease.  Patient Stated Goals To improve balance; to help be less stiff with back  pain  Pain Assessment  Currently in Pain? No/denies  Pain Score 0      02/25/21 1241  Transfers  Transfers Sit to Stand;Stand to Sit  Sit to Stand 6: Modified independent (Device/Increase time);Without upper extremity assist;From chair/3-in-1  Stand to Sit 6: Modified independent (Device/Increase time);To chair/3-in-1;With upper extremity assist  Ambulation/Gait  Ambulation/Gait Yes  Ambulation/Gait Assistance 4:  Min guard  Ambulation/Gait Assistance Details continued with veering noted on outdoor surfaces with no overt balance loss noted or foot scuffing noted. Min guard assist for safety due to fast gait speed that pt prefers.  Ambulation Distance (Feet) 1000 Feet (x1 in/outdoors, plus around clinic with session)  Assistive device None  Gait Pattern Step-through pattern;Wide base of support;Lateral trunk lean to right;Lateral trunk lean to left  Neuro Re-ed   Neuro Re-ed Details  for balance/NMR: gait around track working on speed changes, then scanning all directions randomly. veering noted with pt self correcting balance. min guard assist for safety.      02/25/21 1250  Balance Exercises: Standing  Rockerboard Anterior/posterior;Lateral;EO;EC;30 seconds;Other reps (comment);Limitations  Rockerboard Limitations performed both ways on balance board: rocking the board with empahasis on tall posture with EO, progressing to EC with min guard to min assist; then holding the board steady for EC 30 sec's x 3 reps., min guard to min assist for balance. cues for posture and weight shifting to assist with balance.  Other Standing Exercises blue mat over ramp: performed facing up ramp, then down the ramp with feet hip width apart- alternating forward stepping with weight shifting for 10 reps each, then EC for 30 sec's x 3 reps, progressing to EC head movements left<>right, up<>down for ~10 reps each. Min guard assist for safety.         PT Short Term Goals - 02/09/21 1020       PT SHORT TERM GOAL #1   Title STGs= LTGs               PT Long Term Goals - 02/22/21 1238       PT LONG TERM GOAL #1   Title Pt will be independent with progression of HEP for improved strength, back pain, balance, transfers, and gait.  TARGET  for all LTGs 03/05/2021    Baseline 02/05/21: has current program, will benefit from advancements as pt  progresses    Time 4    Period Weeks    Status On-going      PT LONG  TERM GOAL #2   Title Pt will improve FGA score to at least 22/30 to decrease fall risk.    Baseline 02/03/21: 20/30 scored today, improved from 16/30 just not to goal; 18/30 02/22/21    Time 4    Period Weeks    Status Not Met      PT LONG TERM GOAL #3   Title Pt will stand eyes closed on compliant surface x 30 seconds to demo improved vestibular system use for balance.    Baseline 02/22/21:  : took pt several atttempts with 17 sec, 25 sec    Time 4    Period Weeks    Status On-going      PT LONG TERM GOAL #4   Title Pt will ambulate at least 1000 ft indoor and outdoor surfaces, using appropriate assistive device, mod I and no LOB, for improved community gait.    Baseline 02/22/21: met distance with use of cane with min guard to supervision assist due to veering, poor  sequencing with cane with imbalance when trying to correct sequencing.    Time 4    Period Weeks    Status Not Met      PT LONG TERM GOAL #5   Title Pt will improve SOT composite score to at least 50 for improved overall balance.    Baseline 02/05/21: increased to 46 from 41, not to goal    Time 4    Period Weeks    Status On-going      PT LONG TERM GOAL #6   Title Oswestry Back pain questionaire to improve to less than or equal to 10% disability, to demo improved overall back pain with functional activities.    Baseline 02/03/21: pt scored 16% disability today    Time 4    Status Revised                02/25/21 1240  Plan  Clinical Impression Statement Today's skilled session continued to focus on gait on various surfaces and balnace training on compliant surfaces with no issues noted or reported by pt in session. The pt should benefit from continued PT to progress toward unmet goals.  Personal Factors and Comorbidities Comorbidity 3+  Comorbidities high cholesterol, thyroid, anxiety, fractures R hand and ribs  Examination-Activity Limitations Locomotion Level;Transfers;Stand;Caring for Others   Examination-Participation Restrictions Community Activity  Pt will benefit from skilled therapeutic intervention in order to improve on the following deficits Abnormal gait;Difficulty walking;Decreased balance;Decreased mobility;Decreased strength  Stability/Clinical Decision Making Evolving/Moderate complexity  Rehab Potential Good  PT Frequency 2x / week  PT Duration 4 weeks (per recert 05/12/2835)  PT Treatment/Interventions ADLs/Self Care Home Management;Gait training;Stair training;Functional mobility training;Therapeutic activities;Therapeutic exercise;Balance training;DME Instruction;Neuromuscular re-education;Patient/family education;Vestibular;Moist Heat;Traction;Ultrasound;Electrical Stimulation;Manual techniques  PT Next Visit Plan pt wanting to keep next weeks appts, primary PT agreeable as still in POC. will plan to discharge at end of next week. begin to check LTGs week of 03/01/21  PT Home Exercise Plan Access Code: [email or web address]  Consulted and Agree with Plan of Care Patient        Patient will benefit from skilled therapeutic intervention in order to improve the following deficits and impairments:  Abnormal gait, Difficulty walking, Decreased balance, Decreased mobility, Decreased strength  Visit Diagnosis: Other abnormalities of gait and mobility  Unsteadiness on feet  Muscle weakness (generalized)     Problem List Patient Active Problem List   Diagnosis Date Noted   Acute left-sided low back pain without sciatica 08/06/2018   Anxiety 07/11/2017   Hypothyroidism 02/07/2015   Arthralgia of multiple joints 05/28/2014   Vitamin D deficiency 05/09/2012   Osteopenia 05/26/2010   Hypersomnia 02/22/2010   Hyperlipidemia 01/21/2010   Diverticulosis of large intestine 01/21/2010   Spondylosis, lumbar, with myelopathy 01/21/2010   SKIN CANCER, HX OF 01/21/2010    Willow Ora, PTA, [facility] Outpatient Neuro [facility] [address] [place], [place]  [address] [phone] 02/28/21, 8:17 PM   Name: Audrey [name] MRN: [id number] Date of Birth: 1946-03-28

## 2021-03-01 ENCOUNTER — Ambulatory Visit: Payer: Medicare PPO | Admitting: Physical Therapy

## 2021-03-01 ENCOUNTER — Other Ambulatory Visit: Payer: Self-pay

## 2021-03-01 DIAGNOSIS — R2689 Other abnormalities of gait and mobility: Secondary | ICD-10-CM | POA: Diagnosis not present

## 2021-03-01 DIAGNOSIS — R2681 Unsteadiness on feet: Secondary | ICD-10-CM | POA: Diagnosis not present

## 2021-03-01 DIAGNOSIS — R293 Abnormal posture: Secondary | ICD-10-CM | POA: Diagnosis not present

## 2021-03-01 DIAGNOSIS — M545 Low back pain, unspecified: Secondary | ICD-10-CM | POA: Diagnosis not present

## 2021-03-01 DIAGNOSIS — G8929 Other chronic pain: Secondary | ICD-10-CM | POA: Diagnosis not present

## 2021-03-01 DIAGNOSIS — M6281 Muscle weakness (generalized): Secondary | ICD-10-CM | POA: Diagnosis not present

## 2021-03-01 NOTE — Therapy (Signed)
[facility] [address] [place], [place], [address] Phone: [phone]   Fax:  [phone]  Physical Therapy Treatment  Patient Details  Name: Audrey [name] MRN: [id number] Date of Birth: 1946-03-21 Referring Provider (PT): Hal Hope Tamala Julian); cc'd to Cleta Alberts, [facility]   Encounter Date: 03/01/2021   PT End of Session - 03/01/21 1525     Visit Number 21    Number of Visits 23   per recert addendum 12/04/9377   Date for PT Re-Evaluation 02/40/97   per recert, 4 weeks   Authorization Type [employer]    Authorization Time Period 24 visits from 4/21 - 7/19    Authorization - Visit Number 7    Authorization - Number of Visits 24    Progress Note Due on Visit 84    PT Start Time 3532    PT Stop Time 1358    PT Time Calculation (min) 41 min    Activity Tolerance Patient tolerated treatment well    Behavior During Therapy [facility] for tasks assessed/performed             Past Medical History:  Diagnosis Date   Allergy    seasonal allegies; seem worse this year   Arthritis    Cancer ([place])    basal cell cancer on face removed; Dr Ronnald Ramp   Fracture    right 4th and 5th metacarpal   GERD (gastroesophageal reflux disease)    Hearing aid worn    B/L   HOH (hard of hearing)    Hyperlipidemia    Hypothyroidism    Osteopenia after menopause    Pneumonia    Wears glasses     Past Surgical History:  Procedure Laterality Date   COLONOSCOPY  2012   neg; [facility]   HEMORRHOID SURGERY     OPEN REDUCTION INTERNAL FIXATION (ORIF) METACARPAL Right 04/10/2020   Procedure: OPEN REDUCTION INTERNAL FIXATION (ORIF)  AS NECESSARY RIGHT FOURTH AND FIFTH METACARPAL FRACTURES;  Surgeon: [name], MD;  Location: [facility];  Service: Orthopedics;  Laterality: Right;  OPEN REDUCTION INTERNAL FIXATION (ORIF)  AS NECESSARY RIGHT FOURTH AND FIFTH METACARPAL FRACTURES    There were no vitals filed for this visit.   Subjective  Assessment - 03/01/21 1321     Subjective Did a lot of working in the yard and around the house this weekend.    Pertinent History PMH of vertigo, GERD, macular degeneration, significant degenerative changes and anteriorlistesis    Diagnostic tests MRI imaging shows Moderate levoscoliosis of lumbar spine is noted. Grade 1  anterolisthesis of L4-5 is noted secondary to posterior facet joint  hypertrophy. Mild grade 1 retrolisthesis of L3-4 is noted secondary  to moderate degenerative disc disease at this level. Severe  degenerative disc disease is noted at L1-2 and L2-3 with anterior  osteophyte formation. No fracture is noted.  IMPRESSION:  Multilevel degenerative disc disease.    Patient Stated Goals To improve balance; to help be less stiff with back pain    Currently in Pain? No/denies                  Neuro re-ed: sensory organization test performed with following results: Conditions: 1: all above 2: all above 3: all above 4: Below normal trials 1 and 3 5: all falls 6: all falls Composite score: 45 Sensory Analysis Som: above Vis: below (approx. 70) Vest: significantly below/near zero Pref: above Strategy analysis: trending toward no dominance with ankle preference still  noted (posterior lean all except x 1, in conditions 5 and 6) COG alignment: left anterior       Access Code: [email or web address] URL: [email or web address] Date: 03/01/2021 Prepared by: Mady Haagensen  Exercises-Reviewed as part of HEP-pt return demo understanding  Standing Near Stance in [place] with Eyes Closed - 1 x daily - 5 x weekly - 1 sets - 3 reps - 30 hold Standing Balance in Corner with Eyes Closed - 1 x daily - 5 x weekly - 1 sets - 10 reps Head turns/head nods x 10 reps.  Cues for hand held support to maintain balance Wide Tandem Stance with Eyes Open - 1 x daily - 5 x weekly - 1 sets - 10 reps Performed as rocking as HEP Standing stationary, head turns x 5 reps, head nods x 5 reps  with EO EC x 15 sec with UE support On compliant mat surface:  marching in place, 2 sets x 10 reps                  Balance Exercises - 03/01/21 0001       Balance Exercises: Standing   Gait with Head Turns Forward;4 reps;Limitations    Gait with Head Turns Limitations In gym area, head turns/head nods 20 ft x 4 reps each, several episodes of lateral LOB, pt able to regain after several steps.    Retro Gait 3 reps   Forward/back walking in gym              PT Education - 03/01/21 1524     Education Details Explanation of results of Sensory Organization Test (SOT); compensations for fall prevention given pt's continued vestibular system use    Person(s) Educated Patient    Methods Explanation    Comprehension Verbalized understanding              PT Short Term Goals - 02/09/21 1020       PT SHORT TERM GOAL #1   Title STGs= LTGs               PT Long Term Goals - 03/01/21 1353       PT LONG TERM GOAL #1   Title Pt will be independent with progression of HEP for improved strength, back pain, balance, transfers, and gait.  TARGET  for all LTGs 03/05/2021    Baseline 02/05/21: has current program, will benefit from advancements as pt  progresses    Time 4    Period Weeks    Status Achieved      PT LONG TERM GOAL #2   Title Pt will improve FGA score to at least 22/30 to decrease fall risk.    Baseline 02/03/21: 20/30 scored today, improved from 16/30 just not to goal; 18/30 02/22/21    Time 4    Period Weeks    Status Not Met      PT LONG TERM GOAL #3   Title Pt will stand eyes closed on compliant surface x 30 seconds to demo improved vestibular system use for balance.    Baseline 02/22/21:  : took pt several atttempts with 17 sec, 25 sec    Time 4    Period Weeks    Status On-going      PT LONG TERM GOAL #4   Title Pt will ambulate at least 1000 ft indoor and outdoor surfaces, using appropriate assistive device, mod I and no LOB, for improved  community gait.    Baseline 02/22/21: met distance  with use of cane with min guard to supervision assist due to veering, poor sequencing with cane with imbalance when trying to correct sequencing.    Time 4    Period Weeks    Status Not Met      PT LONG TERM GOAL #5   Title Pt will improve SOT composite score to at least 50 for improved overall balance.    Baseline 02/05/21: increased to 46 from 41, not to goal; 45 on 03/01/2021    Time 4    Period Weeks    Status Not Met      PT LONG TERM GOAL #6   Title Oswestry Back pain questionaire to improve to less than or equal to 10% disability, to demo improved overall back pain with functional activities.    Baseline 02/03/21: pt scored 16% disability today    Time 4    Status Revised                   Plan - 03/01/21 1526     Clinical Impression Statement Assessed Sensory Organization Test this visit, with pt scores 45 on composite score, decreased from 46 last check.  Pt continues to have near zero vestibular system use for balance; therefore education provided regarding fall prevention in regards to decreased vestibular system use.  LTG 1 met and educated pt to continueing to perform HEP upon d/c.  LTG 5 not met for Sensory Organization test.  Plan for checking remaining goals and plans for d/c next visit.    Personal Factors and Comorbidities Comorbidity 3+    Comorbidities high cholesterol, thyroid, anxiety, fractures R hand and ribs    Examination-Activity Limitations Locomotion Level;Transfers;Stand;Caring for Others    Examination-Participation Restrictions Community Activity    Stability/Clinical Decision Making Evolving/Moderate complexity    Rehab Potential Good    PT Frequency 2x / week    PT Duration 4 weeks   per recert 11/11/4663   PT Treatment/Interventions ADLs/Self Care Home Management;Gait training;Stair training;Functional mobility training;Therapeutic activities;Therapeutic exercise;Balance training;DME  Instruction;Neuromuscular re-education;Patient/family education;Vestibular;Moist Heat;Traction;Ultrasound;Electrical Stimulation;Manual techniques    PT Next Visit Plan will plan to discharge next visit; check remaining goals.    PT Home Exercise Plan Access Code: [email or web address]    Consulted and Agree with Plan of Care Patient             Patient will benefit from skilled therapeutic intervention in order to improve the following deficits and impairments:  Abnormal gait, Difficulty walking, Decreased balance, Decreased mobility, Decreased strength  Visit Diagnosis: Other abnormalities of gait and mobility  Unsteadiness on feet     Problem List Patient Active Problem List   Diagnosis Date Noted   Acute left-sided low back pain without sciatica 08/06/2018   Anxiety 07/11/2017   Hypothyroidism 02/07/2015   Arthralgia of multiple joints 05/28/2014   Vitamin D deficiency 05/09/2012   Osteopenia 05/26/2010   Hypersomnia 02/22/2010   Hyperlipidemia 01/21/2010   Diverticulosis of large intestine 01/21/2010   Spondylosis, lumbar, 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 03/01/2021, 3:45 PM Frazier Butt., PT   [facility] [address] [place], [place], [address] Phone: [phone]   Fax:  [phone]  Name: KOULA VENIER MRN: [id number] Date of Birth: February 26, 1946

## 2021-03-02 DIAGNOSIS — M545 Low back pain, unspecified: Secondary | ICD-10-CM | POA: Diagnosis not present

## 2021-03-04 ENCOUNTER — Ambulatory Visit: Payer: Medicare PPO | Admitting: Physical Therapy

## 2021-03-04 ENCOUNTER — Encounter: Payer: Self-pay | Admitting: Physical Therapy

## 2021-03-04 ENCOUNTER — Other Ambulatory Visit: Payer: Self-pay

## 2021-03-04 DIAGNOSIS — R293 Abnormal posture: Secondary | ICD-10-CM

## 2021-03-04 DIAGNOSIS — G8929 Other chronic pain: Secondary | ICD-10-CM | POA: Diagnosis not present

## 2021-03-04 DIAGNOSIS — M545 Low back pain, unspecified: Secondary | ICD-10-CM | POA: Diagnosis not present

## 2021-03-04 DIAGNOSIS — R2689 Other abnormalities of gait and mobility: Secondary | ICD-10-CM | POA: Diagnosis not present

## 2021-03-04 DIAGNOSIS — R2681 Unsteadiness on feet: Secondary | ICD-10-CM

## 2021-03-04 DIAGNOSIS — M6281 Muscle weakness (generalized): Secondary | ICD-10-CM

## 2021-03-04 NOTE — Therapy (Signed)
[facility] [address] [place], [place], [address] Phone: [phone]   Fax:  [phone]  Patient Details  Name: Audrey [name] MRN: [id number] Date of Birth: [birth year] Referring Provider:  No ref. provider found  Encounter Date: 03/04/2021  PHYSICAL THERAPY DISCHARGE SUMMARY  Visits from Start of Care: 22  Current functional level related to goals / functional outcomes:  PT Long Term Goals - 03/04/21 1319       PT LONG TERM GOAL #1   Title Pt will be independent with progression of HEP for improved strength, back pain, balance, transfers, and gait.  TARGET  for all LTGs 03/05/2021    Baseline 03/04/21: met with current HEP    Status Achieved      PT LONG TERM GOAL #2   Title Pt will improve FGA score to at least 22/30 to decrease fall risk.    Baseline 03/04/21: 21/30 scored today, improved just not to goal level    Status Not Met      PT LONG TERM GOAL #3   Title Pt will stand eyes closed on compliant surface x 30 seconds to demo improved vestibular system use for balance.    Baseline 03/04/21: met in session today    Status Achieved      PT LONG TERM GOAL #4   Title Pt will ambulate at least 1000 ft indoor and outdoor surfaces, using appropriate assistive device, mod I and no LOB, for improved community gait.    Baseline 03/04/21: supervision with no device today. mild veering with no overet loss of balance    Status Not Met      PT LONG TERM GOAL #5   Title Pt will improve SOT composite score to at least 50 for improved overall balance.    Baseline 45 on 03/01/2021    Status Not Met      PT LONG TERM GOAL #6   Title Oswestry Back pain questionaire to improve to less than or equal to 10% disability, to demo improved overall back pain with functional activities.    Baseline 03/04/21: pt scored a 9 on the questionaire today, 5-14 = mild disability    Status Achieved            Pt has met 3 of 6 LTGs.    Remaining deficits: Balance, decreased vestibular system use for balance.   Education / Equipment: Educated in [employer], fall prevention, strategies with posture, ADLs to lessen back pain.   Patient agrees to discharge. Patient goals were partially met. Patient is being discharged due to being pleased with the current functional level.   Albaraa Swingle W. 03/04/2021, 5:32 PM Mady Haagensen, PT 03/04/21 5:34 PM Phone: [phone] Fax: [facility] [address] [place], [place], [address] Phone: [phone]   Fax:  [phone]

## 2021-03-04 NOTE — Therapy (Signed)
[facility] [address] [place], [place], [address] Phone: [phone]   Fax:  [phone]  Physical Therapy Treatment  Patient Details  Name: Audrey [name] MRN: [id number] Date of Birth: 08-13-1946 Referring Provider (PT): Hal Hope Tamala Julian); cc'd to Cleta Alberts, [facility]   Encounter Date: 03/04/2021   PT End of Session - 03/04/21 1238     Visit Number 22    Number of Visits 23   per recert addendum 0/09/270   Date for PT Re-Evaluation 53/66/44   per recert, 4 weeks   Authorization Type [employer]    Authorization Time Period 24 visits from 4/21 - 7/19    Authorization - Visit Number 21    Authorization - Number of Visits 24    Progress Note Due on Visit 72    PT Start Time 1234    PT Stop Time 1312    PT Time Calculation (min) 38 min    Activity Tolerance Patient tolerated treatment well    Behavior During Therapy WFL for tasks assessed/performed             Past Medical History:  Diagnosis Date   Allergy    seasonal allegies; seem worse this year   Arthritis    Cancer ([place])    basal cell cancer on face removed; Dr Ronnald Ramp   Fracture    right 4th and 5th metacarpal   GERD (gastroesophageal reflux disease)    Hearing aid worn    B/L   HOH (hard of hearing)    Hyperlipidemia    Hypothyroidism    Osteopenia after menopause    Pneumonia    Wears glasses     Past Surgical History:  Procedure Laterality Date   COLONOSCOPY  2012   neg; [facility]   HEMORRHOID SURGERY     OPEN REDUCTION INTERNAL FIXATION (ORIF) METACARPAL Right 04/10/2020   Procedure: OPEN REDUCTION INTERNAL FIXATION (ORIF)  AS NECESSARY RIGHT FOURTH AND FIFTH METACARPAL FRACTURES;  Surgeon: [name], MD;  Location: [facility];  Service: Orthopedics;  Laterality: Right;  OPEN REDUCTION INTERNAL FIXATION (ORIF)  AS NECESSARY RIGHT FOURTH AND FIFTH METACARPAL FRACTURES    There were no vitals filed for this visit.   Subjective  Assessment - 03/04/21 1237     Subjective No new complaints. No falls to report. Did have tooth pulled yesterday so on Advil and having no pain.    Pertinent History PMH of vertigo, GERD, macular degeneration, significant degenerative changes and anteriorlistesis    Diagnostic tests MRI imaging shows Moderate levoscoliosis of lumbar spine is noted. Grade 1  anterolisthesis of L4-5 is noted secondary to posterior facet joint  hypertrophy. Mild grade 1 retrolisthesis of L3-4 is noted secondary  to moderate degenerative disc disease at this level. Severe  degenerative disc disease is noted at L1-2 and L2-3 with anterior  osteophyte formation. No fracture is noted.  IMPRESSION:  Multilevel degenerative disc disease.    Currently in Pain? No/denies    Pain Score 0-No pain                [facility] PT Assessment - 03/04/21 1240       Functional Gait  Assessment   Gait assessed  Yes    Gait Level Surface Walks 20 ft in less than 7 sec but greater than 5.5 sec, uses assistive device, slower speed, mild gait deviations, or deviates 6-10 in outside of the 12 in walkway width.   6 seconds  Change in Gait Speed Able to smoothly change walking speed without loss of balance or gait deviation. Deviate no more than 6 in outside of the 12 in walkway width.    Gait with Horizontal Head Turns Performs head turns smoothly with no change in gait. Deviates no more than 6 in outside 12 in walkway width    Gait with Vertical Head Turns Performs head turns with no change in gait. Deviates no more than 6 in outside 12 in walkway width.    Gait and Pivot Turn Pivot turns safely within 3 sec and stops quickly with no loss of balance.    Step Over Obstacle Is able to step over one shoe box (4.5 in total height) without changing gait speed. No evidence of imbalance.    Gait with Narrow Base of Support Ambulates less than 4 steps heel to toe or cannot perform without assistance.    Gait with Eyes Closed Cannot walk 20 ft  without assistance, severe gait deviations or imbalance, deviates greater than 15 in outside 12 in walkway width or will not attempt task.    Ambulating Backwards Walks 20 ft, no assistive devices, good speed, no evidence for imbalance, normal gait   14.60 sec's   Steps Alternating feet, must use rail.    Total Score 21    FGA comment: 21/30 19-24 Medium risk for fall                 [facility] Adult PT Treatment/Exercise - 03/04/21 1240       Transfers   Transfers Sit to Stand;Stand to Sit    Sit to Stand 6: Modified independent (Device/Increase time);Without upper extremity assist;From chair/3-in-1    Stand to Sit 6: Modified independent (Device/Increase time);To chair/3-in-1;With upper extremity assist      Ambulation/Gait   Ambulation/Gait Yes    Ambulation/Gait Assistance 5: Supervision    Ambulation/Gait Assistance Details supervision today with outdoor gait. pt continues to veer at times however no balance loss noted. pt with slower gait speed today with improved stability noted with gait.    Ambulation Distance (Feet) 1000 Feet   x1, plus aorund gym   Assistive device None    Gait Pattern Step-through pattern;Wide base of support;Lateral trunk lean to right;Lateral trunk lean to left    Ambulation Surface Level;Unlevel;Indoor;Outdoor;Paved      Self-Care   Self-Care Other Self-Care Comments    Other Self-Care Comments  administered Oswestry Back Pain Questionaire with pt scoring 9 points. On the scale 5-14 points is Mild Disability.                 Balance Exercises - 03/04/21 1301       Balance Exercises: Standing   Standing Eyes Closed Narrow base of support (BOS);Wide (BOA);Head turns;Foam/compliant surface;Other reps (comment);30 secs;Limitations    Standing Eyes Closed Limitations on airex no UE support- feet hip width apart- EC for 30 sec's x 3 reps with supervision; then with feet wider apart for EC head movements left<>right, up<>down for ~10 reps each min  guard assist for safety.                 PT Short Term Goals - 02/09/21 1020       PT SHORT TERM GOAL #1   Title STGs= LTGs               PT Long Term Goals - 03/04/21 1319       PT LONG TERM GOAL #1  Title Pt will be independent with progression of HEP for improved strength, back pain, balance, transfers, and gait.  TARGET  for all LTGs 03/05/2021    Baseline 03/04/21: met with current HEP    Status Achieved      PT LONG TERM GOAL #2   Title Pt will improve FGA score to at least 22/30 to decrease fall risk.    Baseline 03/04/21: 21/30 scored today, improved just not to goal level    Status Not Met      PT LONG TERM GOAL #3   Title Pt will stand eyes closed on compliant surface x 30 seconds to demo improved vestibular system use for balance.    Baseline 03/04/21: met in session today    Status Achieved      PT LONG TERM GOAL #4   Title Pt will ambulate at least 1000 ft indoor and outdoor surfaces, using appropriate assistive device, mod I and no LOB, for improved community gait.    Baseline 03/04/21: supervision with no device today. mild veering with no overet loss of balance    Status Not Met      PT LONG TERM GOAL #5   Title Pt will improve SOT composite score to at least 50 for improved overall balance.    Baseline 45 on 03/01/2021    Status Not Met      PT LONG TERM GOAL #6   Title Oswestry Back pain questionaire to improve to less than or equal to 10% disability, to demo improved overall back pain with functional activities.    Baseline 03/04/21: pt scored a 9 on the questionaire today, 5-14 = mild disability    Status Achieved                   Plan - 03/04/21 1238     Clinical Impression Statement Today's skilled session focused on progress toward remaining goals. Pt continues to need at least supervision on outdoor surfaces for safety. Pt was able to stand on foam for 30 sec's with EC with no assist needed this session meeting that goal. Pt has  also met her HEP goal and her goal to improve her score on the Oswestry Low Back Pain Questionaire. Pt is agreeable to discharge today.    Personal Factors and Comorbidities Comorbidity 3+    Comorbidities high cholesterol, thyroid, anxiety, fractures R hand and ribs    Examination-Activity Limitations Locomotion Level;Transfers;Stand;Caring for Others    Examination-Participation Restrictions Community Activity    Stability/Clinical Decision Making Evolving/Moderate complexity    Rehab Potential Good    PT Frequency 2x / week    PT Duration 4 weeks   per recert 02/08/599   PT Treatment/Interventions ADLs/Self Care Home Management;Gait training;Stair training;Functional mobility training;Therapeutic activities;Therapeutic exercise;Balance training;DME Instruction;Neuromuscular re-education;Patient/family education;Vestibular;Moist Heat;Traction;Ultrasound;Electrical Stimulation;Manual techniques    PT Next Visit Plan discharge per PT plan of care    PT Home Exercise Plan Access Code: [email or web address]    Consulted and Agree with Plan of Care Patient             Patient will benefit from skilled therapeutic intervention in order to improve the following deficits and impairments:  Abnormal gait, Difficulty walking, Decreased balance, Decreased mobility, Decreased strength  Visit Diagnosis: Other abnormalities of gait and mobility  Unsteadiness on feet  Muscle weakness (generalized)  Chronic right-sided low back pain without sciatica  Abnormal posture     Problem List Patient Active Problem List   Diagnosis Date Noted  Acute left-sided low back pain without sciatica 08/06/2018   Anxiety 07/11/2017   Hypothyroidism 02/07/2015   Arthralgia of multiple joints 05/28/2014   Vitamin D deficiency 05/09/2012   Osteopenia 05/26/2010   Hypersomnia 02/22/2010   Hyperlipidemia 01/21/2010   Diverticulosis of large intestine 01/21/2010   Spondylosis, lumbar, with myelopathy 01/21/2010   SKIN  CANCER, HX OF 01/21/2010    Willow Ora, PTA, [facility] [address] [place], [place] [address] [phone] 03/04/21, 1:31 PM   Name: Audrey [name] MRN: [id number] Date of Birth: 07-23-1946

## 2021-03-22 DIAGNOSIS — M5459 Other low back pain: Secondary | ICD-10-CM | POA: Diagnosis not present

## 2021-03-29 DIAGNOSIS — H04123 Dry eye syndrome of bilateral lacrimal glands: Secondary | ICD-10-CM | POA: Diagnosis not present

## 2021-03-29 DIAGNOSIS — H524 Presbyopia: Secondary | ICD-10-CM | POA: Diagnosis not present

## 2021-03-29 DIAGNOSIS — H353132 Nonexudative age-related macular degeneration, bilateral, intermediate dry stage: Secondary | ICD-10-CM | POA: Diagnosis not present

## 2021-03-29 DIAGNOSIS — H2513 Age-related nuclear cataract, bilateral: Secondary | ICD-10-CM | POA: Diagnosis not present

## 2021-04-11 DIAGNOSIS — M545 Low back pain, unspecified: Secondary | ICD-10-CM | POA: Diagnosis not present

## 2021-05-07 DIAGNOSIS — M25512 Pain in left shoulder: Secondary | ICD-10-CM | POA: Diagnosis not present

## 2021-06-01 ENCOUNTER — Encounter: Payer: Self-pay | Admitting: Internal Medicine

## 2021-06-03 DIAGNOSIS — M47816 Spondylosis without myelopathy or radiculopathy, lumbar region: Secondary | ICD-10-CM | POA: Diagnosis not present

## 2021-08-10 DIAGNOSIS — M47816 Spondylosis without myelopathy or radiculopathy, lumbar region: Secondary | ICD-10-CM | POA: Diagnosis not present

## 2021-08-31 DIAGNOSIS — M47816 Spondylosis without myelopathy or radiculopathy, lumbar region: Secondary | ICD-10-CM | POA: Diagnosis not present

## 2021-08-31 DIAGNOSIS — M47896 Other spondylosis, lumbar region: Secondary | ICD-10-CM | POA: Diagnosis not present

## 2021-09-10 DIAGNOSIS — Z1231 Encounter for screening mammogram for malignant neoplasm of breast: Secondary | ICD-10-CM | POA: Diagnosis not present

## 2021-09-15 DIAGNOSIS — M47816 Spondylosis without myelopathy or radiculopathy, lumbar region: Secondary | ICD-10-CM | POA: Diagnosis not present

## 2021-09-16 DIAGNOSIS — M8580 Other specified disorders of bone density and structure, unspecified site: Secondary | ICD-10-CM | POA: Diagnosis not present

## 2021-09-16 DIAGNOSIS — N814 Uterovaginal prolapse, unspecified: Secondary | ICD-10-CM | POA: Diagnosis not present

## 2021-09-16 DIAGNOSIS — Z01419 Encounter for gynecological examination (general) (routine) without abnormal findings: Secondary | ICD-10-CM | POA: Diagnosis not present

## 2021-09-16 DIAGNOSIS — L292 Pruritus vulvae: Secondary | ICD-10-CM | POA: Diagnosis not present

## 2021-09-23 DIAGNOSIS — M47896 Other spondylosis, lumbar region: Secondary | ICD-10-CM | POA: Diagnosis not present

## 2021-09-23 DIAGNOSIS — M47816 Spondylosis without myelopathy or radiculopathy, lumbar region: Secondary | ICD-10-CM | POA: Diagnosis not present

## 2021-10-02 DIAGNOSIS — M542 Cervicalgia: Secondary | ICD-10-CM | POA: Diagnosis not present

## 2021-10-06 ENCOUNTER — Ambulatory Visit: Payer: Medicare PPO | Admitting: Internal Medicine

## 2021-10-07 DIAGNOSIS — M5030 Other cervical disc degeneration, unspecified cervical region: Secondary | ICD-10-CM | POA: Diagnosis not present

## 2021-10-07 DIAGNOSIS — M47816 Spondylosis without myelopathy or radiculopathy, lumbar region: Secondary | ICD-10-CM | POA: Diagnosis not present

## 2021-10-15 DIAGNOSIS — M5412 Radiculopathy, cervical region: Secondary | ICD-10-CM | POA: Diagnosis not present

## 2021-10-20 DIAGNOSIS — M5412 Radiculopathy, cervical region: Secondary | ICD-10-CM | POA: Diagnosis not present

## 2021-10-22 DIAGNOSIS — M47816 Spondylosis without myelopathy or radiculopathy, lumbar region: Secondary | ICD-10-CM | POA: Diagnosis not present

## 2021-10-22 DIAGNOSIS — M47896 Other spondylosis, lumbar region: Secondary | ICD-10-CM | POA: Diagnosis not present

## 2021-10-26 ENCOUNTER — Ambulatory Visit: Payer: Medicare PPO | Admitting: Internal Medicine

## 2021-10-26 DIAGNOSIS — M5412 Radiculopathy, cervical region: Secondary | ICD-10-CM | POA: Diagnosis not present

## 2021-10-29 DIAGNOSIS — M5412 Radiculopathy, cervical region: Secondary | ICD-10-CM | POA: Diagnosis not present

## 2021-11-01 DIAGNOSIS — M5412 Radiculopathy, cervical region: Secondary | ICD-10-CM | POA: Diagnosis not present

## 2021-11-05 DIAGNOSIS — H2513 Age-related nuclear cataract, bilateral: Secondary | ICD-10-CM | POA: Diagnosis not present

## 2021-11-05 DIAGNOSIS — M5412 Radiculopathy, cervical region: Secondary | ICD-10-CM | POA: Diagnosis not present

## 2021-11-05 DIAGNOSIS — H353132 Nonexudative age-related macular degeneration, bilateral, intermediate dry stage: Secondary | ICD-10-CM | POA: Diagnosis not present

## 2021-11-05 DIAGNOSIS — H35373 Puckering of macula, bilateral: Secondary | ICD-10-CM | POA: Diagnosis not present

## 2021-11-08 DIAGNOSIS — M5030 Other cervical disc degeneration, unspecified cervical region: Secondary | ICD-10-CM | POA: Diagnosis not present

## 2021-11-09 DIAGNOSIS — M5412 Radiculopathy, cervical region: Secondary | ICD-10-CM | POA: Diagnosis not present

## 2021-11-11 DIAGNOSIS — D2262 Melanocytic nevi of left upper limb, including shoulder: Secondary | ICD-10-CM | POA: Diagnosis not present

## 2021-11-11 DIAGNOSIS — Z85828 Personal history of other malignant neoplasm of skin: Secondary | ICD-10-CM | POA: Diagnosis not present

## 2021-11-11 DIAGNOSIS — L280 Lichen simplex chronicus: Secondary | ICD-10-CM | POA: Diagnosis not present

## 2021-11-11 DIAGNOSIS — L720 Epidermal cyst: Secondary | ICD-10-CM | POA: Diagnosis not present

## 2021-11-11 DIAGNOSIS — D2261 Melanocytic nevi of right upper limb, including shoulder: Secondary | ICD-10-CM | POA: Diagnosis not present

## 2021-11-11 DIAGNOSIS — L821 Other seborrheic keratosis: Secondary | ICD-10-CM | POA: Diagnosis not present

## 2021-11-11 DIAGNOSIS — D225 Melanocytic nevi of trunk: Secondary | ICD-10-CM | POA: Diagnosis not present

## 2021-11-11 DIAGNOSIS — D1801 Hemangioma of skin and subcutaneous tissue: Secondary | ICD-10-CM | POA: Diagnosis not present

## 2021-12-01 ENCOUNTER — Other Ambulatory Visit: Payer: Self-pay | Admitting: Internal Medicine

## 2021-12-01 ENCOUNTER — Ambulatory Visit: Payer: Medicare PPO | Admitting: Internal Medicine

## 2021-12-01 ENCOUNTER — Encounter: Payer: Self-pay | Admitting: Internal Medicine

## 2021-12-01 VITALS — BP 124/82 | HR 63 | Ht 62.0 in | Wt 154.0 lb

## 2021-12-01 DIAGNOSIS — Z1211 Encounter for screening for malignant neoplasm of colon: Secondary | ICD-10-CM | POA: Diagnosis not present

## 2021-12-01 DIAGNOSIS — K219 Gastro-esophageal reflux disease without esophagitis: Secondary | ICD-10-CM

## 2021-12-01 DIAGNOSIS — K5730 Diverticulosis of large intestine without perforation or abscess without bleeding: Secondary | ICD-10-CM | POA: Diagnosis not present

## 2021-12-01 MED ORDER — SUTAB 1479-225-188 MG PO TABS: 1.0000 | ORAL_TABLET | Freq: Once | ORAL | 0 refills | Status: AC

## 2021-12-01 NOTE — Patient Instructions (Signed)
If you are age 77 or older, your body mass index should be between 23-30. Your Body mass index is 28.17 kg/m?Marland Kitchen If this is out of the aforementioned range listed, please consider follow up with your Primary Care Provider. ? ?If you are age 29 or younger, your body mass index should be between 19-25. Your Body mass index is 28.17 kg/m?Marland Kitchen If this is out of the aformentioned range listed, please consider follow up with your Primary Care Provider.  ? ?________________________________________________________ ? ?The [facility] providers would like to encourage you to use [facility] to communicate with providers for non-urgent requests or questions.  Due to long hold times on the telephone, sending your provider a message by [facility] may be a faster and more efficient way to get a response.  Please allow 48 business hours for a response.  Please remember that this is for non-urgent requests.  ?_______________________________________________________ ? ?You have been scheduled for a colonoscopy. Please follow written instructions given to you at your visit today.  ?Please pick up your prep supplies at the pharmacy within the next 1-3 days. ?If you use inhalers (even only as needed), please bring them with you on the day of your procedure. ? ?

## 2021-12-01 NOTE — Progress Notes (Signed)
HISTORY OF PRESENT ILLNESS: ? ?Audrey [name] is a 76 y.o. female, mother of Dr. Vanessa Kick [facility] [place], with hyperlipidemia, hypothyroidism, and GERD who presents today regarding screening colonoscopy.  Patient underwent routine screening colonoscopy April 07, 2011.  She was found to have moderate sigmoid diverticulosis.  No polyps.  Routine follow-up in 10 years recommended.  Patient denies any relevant lower GI symptoms such as change in bowel habits or bleeding.  No abdominal pain.  She does have chronic GERD which is managed completely with pantoprazole 20 mg daily.  No dysphagia.  No interval family history of colon cancer.  Patient tells me that she is active at age 74 and intends on living to be 89 or 89.  She is interested in colorectal neoplasia screening for the purposes of colon cancer prevention.  She recently had a friend diagnosed with colon cancer, and this concerns her.  No relevant outside studies were review. ? ?REVIEW OF SYSTEMS: ? ?All non-GI ROS negative unless otherwise stated in the HPI except for back pain, hearing problems ? ?Past Medical History:  ?Diagnosis Date  ? Allergy   ? seasonal allegies; seem worse this year  ? Arthritis   ? Cancer [facility])   ? 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 Fracture   ? right 4th and 5th metacarpal  ? GERD (gastroesophageal reflux disease)   ? Hearing aid worn   ? B/L  ? HOH (hard of hearing)   ? Hyperlipidemia   ? Hypothyroidism   ? Osteopenia after menopause   ? Pneumonia   ? Wears glasses   ? ? ?Past Surgical History:  ?Procedure Laterality Date  ? COLONOSCOPY  2012  ? neg; [facility]  ? HEMORRHOID SURGERY    ? OPEN REDUCTION INTERNAL FIXATION (ORIF) METACARPAL Right 04/10/2020  ? Procedure: OPEN REDUCTION INTERNAL FIXATION (ORIF)  AS NECESSARY RIGHT FOURTH AND FIFTH METACARPAL FRACTURES;  Surgeon: [name], MD;  Location: [facility];  Service: Orthopedics;  Laterality: Right;  OPEN REDUCTION INTERNAL FIXATION (ORIF)  AS  NECESSARY RIGHT FOURTH AND FIFTH METACARPAL FRACTURES  ? ? ?Social History ?Malvin Johns  reports that she has never smoked. She has never used smokeless tobacco. She reports that she does not drink alcohol and does not use drugs. ? ?family history includes Coronary artery disease in her brother; Heart attack in her father, maternal grandfather, and mother; Other in her brother; Stroke in her maternal grandmother; Uterine cancer in her mother. ? ?Allergies  ?Allergen Reactions  ? Celecoxib Swelling  ?  face  ? Sulfonamide Derivatives Swelling  ?  face  ? ? ?  ? ?PHYSICAL EXAMINATION: ?Vital signs: BP 124/82   Pulse 63   Ht '5\' 2"'$  (1.575 m)   Wt 154 lb (69.9 kg)   BMI 28.17 kg/m?   ?Constitutional: generally well-appearing, no acute distress ?Psychiatric: alert and oriented x3, cooperative ?Eyes: extraocular movements intact, anicteric, conjunctiva pink ?Mouth: oral pharynx moist, no lesions ?Neck: supple no lymphadenopathy ?Cardiovascular: heart regular rate and rhythm, no murmur ?Lungs: clear to auscultation bilaterally ?Abdomen: soft, nontender, nondistended, no obvious ascites, no peritoneal signs, normal bowel sounds, no organomegaly ?Rectal: Deferred until colonoscopy ?Extremities: no clubbing, cyanosis, or lower extremity edema bilaterally ?Skin: no lesions on visible extremities ?Neuro: No focal deficits.  Cranial nerves intact ? ?ASSESSMENT: ? ?1.  Colon cancer screening.  Average risk.  Last examination August 2012 was negative for neoplasia.  She wishes to proceed.  She is  an appropriate candidate without contraindication ?2.  Colonic diverticulosis ?3.  GERD without alarm features.  Managed with PPI  ?4.  General medical problems.  Stable ? ?PLAN: ? ?1.  Colonoscopy.The nature of the procedure, as well as the risks, benefits, and alternatives were carefully and thoroughly reviewed with the patient. Ample time for discussion and questions allowed. The patient understood, was satisfied, and agreed  to proceed.  ?2.  Reflux precautions ?3.  Continue PPI.  Lowest dose to adequately control symptoms is appropriate ? ? ? ? ?  ?

## 2021-12-15 DIAGNOSIS — H35373 Puckering of macula, bilateral: Secondary | ICD-10-CM | POA: Diagnosis not present

## 2021-12-15 DIAGNOSIS — H3581 Retinal edema: Secondary | ICD-10-CM | POA: Diagnosis not present

## 2021-12-15 DIAGNOSIS — H353132 Nonexudative age-related macular degeneration, bilateral, intermediate dry stage: Secondary | ICD-10-CM | POA: Diagnosis not present

## 2021-12-15 DIAGNOSIS — H43813 Vitreous degeneration, bilateral: Secondary | ICD-10-CM | POA: Diagnosis not present

## 2021-12-28 DIAGNOSIS — H25013 Cortical age-related cataract, bilateral: Secondary | ICD-10-CM | POA: Diagnosis not present

## 2021-12-28 DIAGNOSIS — H2513 Age-related nuclear cataract, bilateral: Secondary | ICD-10-CM | POA: Diagnosis not present

## 2021-12-28 DIAGNOSIS — H35373 Puckering of macula, bilateral: Secondary | ICD-10-CM | POA: Diagnosis not present

## 2021-12-28 DIAGNOSIS — H2511 Age-related nuclear cataract, right eye: Secondary | ICD-10-CM | POA: Diagnosis not present

## 2021-12-28 DIAGNOSIS — H18413 Arcus senilis, bilateral: Secondary | ICD-10-CM | POA: Diagnosis not present

## 2021-12-28 DIAGNOSIS — H25043 Posterior subcapsular polar age-related cataract, bilateral: Secondary | ICD-10-CM | POA: Diagnosis not present

## 2022-01-17 DIAGNOSIS — H2511 Age-related nuclear cataract, right eye: Secondary | ICD-10-CM | POA: Diagnosis not present

## 2022-01-17 DIAGNOSIS — H3581 Retinal edema: Secondary | ICD-10-CM | POA: Diagnosis not present

## 2022-01-17 DIAGNOSIS — H35371 Puckering of macula, right eye: Secondary | ICD-10-CM | POA: Diagnosis not present

## 2022-01-18 DIAGNOSIS — H35371 Puckering of macula, right eye: Secondary | ICD-10-CM | POA: Diagnosis not present

## 2022-01-24 ENCOUNTER — Telehealth: Payer: Self-pay | Admitting: Internal Medicine

## 2022-01-24 NOTE — Telephone Encounter (Signed)
Patient called states she still does not have her prep medication for her procedure on 02/03/22.

## 2022-01-25 DIAGNOSIS — H35371 Puckering of macula, right eye: Secondary | ICD-10-CM | POA: Diagnosis not present

## 2022-01-25 NOTE — Telephone Encounter (Signed)
Spoke with pharmacy who said rx is on hold because not covered by insurance.  Faxed a Medicare Suprep coupon to pharmacy

## 2022-01-26 ENCOUNTER — Encounter: Payer: Self-pay | Admitting: Internal Medicine

## 2022-02-03 ENCOUNTER — Ambulatory Visit (AMBULATORY_SURGERY_CENTER): Payer: Medicare PPO | Admitting: Internal Medicine

## 2022-02-03 ENCOUNTER — Encounter: Payer: Self-pay | Admitting: Internal Medicine

## 2022-02-03 VITALS — BP 124/76 | HR 65 | Temp 97.8°F | Resp 14 | Ht 62.0 in | Wt 154.0 lb

## 2022-02-03 DIAGNOSIS — D122 Benign neoplasm of ascending colon: Secondary | ICD-10-CM | POA: Diagnosis not present

## 2022-02-03 DIAGNOSIS — Z1211 Encounter for screening for malignant neoplasm of colon: Secondary | ICD-10-CM | POA: Diagnosis not present

## 2022-02-03 MED ORDER — SODIUM CHLORIDE 0.9 % IV SOLN: 500.0000 mL | Freq: Once | INTRAVENOUS | Status: DC

## 2022-02-03 NOTE — Progress Notes (Signed)
HISTORY OF PRESENT ILLNESS:   Audrey [name] is a 76 y.o. female, mother of Dr. Vanessa Kick [facility], with hyperlipidemia, hypothyroidism, and GERD who presents today regarding screening colonoscopy.  Patient underwent routine screening colonoscopy April 07, 2011.  She was found to have moderate sigmoid diverticulosis.  No polyps.  Routine follow-up in 10 years recommended.  Patient denies any relevant lower GI symptoms such as change in bowel habits or bleeding.  No abdominal pain.  She does have chronic GERD which is managed completely with pantoprazole 20 mg daily.  No dysphagia.  No interval family history of colon cancer.  Patient tells me that she is active at age 32 and intends on living to be 89 or 89.  She is interested in colorectal neoplasia screening for the purposes of colon cancer prevention.  She recently had a friend diagnosed with colon cancer, and this concerns her.  No relevant outside studies were review.  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; Dr Ronnald Ramp   Fracture      right 4th and 5th metacarpal   GERD (gastroesophageal reflux disease)     Hearing aid worn      B/L   HOH (hard of hearing)     Hyperlipidemia     Hypothyroidism     Osteopenia after menopause     Pneumonia     Wears glasses             Past Surgical History:  Procedure Laterality Date   COLONOSCOPY   2012    neg; [facility]   HEMORRHOID SURGERY       OPEN REDUCTION INTERNAL FIXATION (ORIF) METACARPAL Right 04/10/2020    Procedure: OPEN REDUCTION INTERNAL FIXATION (ORIF)  AS NECESSARY RIGHT FOURTH AND FIFTH METACARPAL FRACTURES;  Surgeon: [name], MD;  Location[place];  Service: Orthopedics;  Laterality: Right;  OPEN REDUCTION  INTERNAL FIXATION (ORIF)  AS NECESSARY RIGHT FOURTH AND FIFTH METACARPAL FRACTURES      Social History JACI DESANTO  reports that she has never smoked. She has never used smokeless tobacco. She reports that she does not drink alcohol and does not use drugs.   family history includes Coronary artery disease in her brother; Heart attack in her father, maternal grandfather, and mother; Other in her brother; Stroke in her maternal grandmother; Uterine cancer in her mother.        Allergies  Allergen Reactions   Celecoxib Swelling      face   Sulfonamide Derivatives Swelling      face          PHYSICAL EXAMINATION: Vital signs: BP 124/82   Pulse 63   Ht '5\' 2"'$  (1.575 m)   Wt 154 lb (69.9 kg)   BMI 28.17 kg/m   Constitutional: generally well-appearing, no acute distress Psychiatric: alert and oriented x3, cooperative Eyes: extraocular movements intact, anicteric, conjunctiva pink Mouth: oral pharynx moist, no lesions Neck: supple no lymphadenopathy Cardiovascular: heart regular rate and rhythm, no murmur Lungs: clear to auscultation bilaterally Abdomen: soft, nontender, nondistended, no obvious ascites, no peritoneal signs, normal bowel sounds, no organomegaly Rectal: Deferred until colonoscopy  Extremities: no clubbing, cyanosis, or lower extremity edema bilaterally Skin: no lesions on visible extremities Neuro: No focal deficits.  Cranial nerves intact   ASSESSMENT:   1.  Colon cancer screening.  Average risk.  Last examination August 2012 was negative for neoplasia.  She wishes to proceed.  She is an appropriate candidate without contraindication 2.  Colonic diverticulosis 3.  GERD without alarm features.  Managed with PPI  4.  General medical problems.  Stable   PLAN:   1.  Colonoscopy.The nature of the procedure, as well as the risks, benefits, and alternatives were carefully and thoroughly reviewed with the patient. Ample time for discussion and questions allowed.  The patient understood, was satisfied, and agreed to proceed.  2.  Reflux precautions 3.  Continue PPI.  Lowest dose to adequately control symptoms is appropriate  No interval clinical changes.  Colonoscopy.

## 2022-02-03 NOTE — Patient Instructions (Signed)
Handout on polyps provided   Await pathology results.   Continue current medications.  YOU HAD AN ENDOSCOPIC PROCEDURE TODAY AT [facility]:   Refer to the procedure report that was given to you for any specific questions about what was found during the examination.  If the procedure report does not answer your questions, please call your gastroenterologist to clarify.  If you requested that your care partner not be given the details of your procedure findings, then the procedure report has been included in a sealed envelope for you to review at your convenience later.  YOU SHOULD EXPECT: Some feelings of bloating in the abdomen. Passage of more gas than usual.  Walking can help get rid of the air that was put into your GI tract during the procedure and reduce the bloating. If you had a lower endoscopy (such as a colonoscopy or flexible sigmoidoscopy) you may notice spotting of blood in your stool or on the toilet paper. If you underwent a bowel prep for your procedure, you may not have a normal bowel movement for a few days.  Please Note:  You might notice some irritation and congestion in your nose or some drainage.  This is from the oxygen used during your procedure.  There is no need for concern and it should clear up in a day or so.  SYMPTOMS TO REPORT IMMEDIATELY:  Following lower endoscopy (colonoscopy or flexible sigmoidoscopy):  Excessive amounts of blood in the stool  Significant tenderness or worsening of abdominal pains  Swelling of the abdomen that is new, acute  Fever of 100F or higher  For urgent or emergent issues, a gastroenterologist can be reached at any hour by calling [phone]. Do not use MyChart messaging for urgent concerns.    DIET:  We do recommend a small meal at first, but then you may proceed to your regular diet.  Drink plenty of fluids but you should avoid alcoholic beverages for 24 hours.  ACTIVITY:  You should plan to take it easy for  the rest of today and you should NOT DRIVE or use heavy machinery until tomorrow (because of the sedation medicines used during the test).    FOLLOW UP: Our staff will call the number listed on your records 48-72 hours following your procedure to check on you and address any questions or concerns that you may have regarding the information given to you following your procedure. If we do not reach you, we will leave a message.  We will attempt to reach you two times.  During this call, we will ask if you have developed any symptoms of COVID 19. If you develop any symptoms (ie: fever, flu-like symptoms, shortness of breath, cough etc.) before then, please call [phone].  If you test positive for Covid 19 in the 2 weeks post procedure, please call and report this information to [place].    If any biopsies were taken you will be contacted by phone or by letter within the next 1-3 weeks.  Please call us at [phone] if you have not heard about the biopsies in 3 weeks.    SIGNATURES/CONFIDENTIALITY: You and/or your care partner have signed paperwork which will be entered into your electronic medical record.  These signatures attest to the fact that that the information above on your After Visit Summary has been reviewed and is understood.  Full responsibility of the confidentiality of this discharge information lies with you and/or your care-partner.

## 2022-02-03 NOTE — Progress Notes (Signed)
Sedate, gd SR, tolerated procedure well, VSS, report to RN 

## 2022-02-03 NOTE — Op Note (Signed)
[facility] Patient Name: Audrey [name] Procedure Date: 02/03/2022 8:49 AM MRN: [id number] Endoscopist: Docia Chuck. Henrene Pastor , MD Age: 76 Referring MD:  Date of Birth: Jan 24, 1946 Gender: Female Account #: [id number] Procedure:                Colonoscopy with cold snare tomy x 1 Indications:              Screening for colorectal malignant neoplasm.                            Negative exam 2012 Medicines:                Monitored Anesthesia Care Procedure:                Pre-Anesthesia Assessment:                           - Prior to the procedure, a History and Physical                            was performed, and patient medications and                            allergies were reviewed. The patient's tolerance of                            previous anesthesia was also reviewed. The risks                            and benefits of the procedure and the sedation                            options and risks were discussed with the patient.                            All questions were answered, and informed consent                            was obtained. Prior Anticoagulants: The patient has                            taken no previous anticoagulant or antiplatelet                            agents. ASA Grade Assessment: II - A patient with                            mild systemic disease. After reviewing the risks                            and benefits, the patient was deemed in                            satisfactory condition to undergo the procedure.  After obtaining informed consent, the colonoscope                            was passed under direct vision. Throughout the                            procedure, the patient's blood pressure, pulse, and                            oxygen saturations were monitored continuously. The                            Olympus CF-HQ190L (Serial# [id number]) Colonoscope was                            introduced through  the anus and advanced to the the                            cecum, identified by appendiceal orifice and                            ileocecal valve. The ileocecal valve, appendiceal                            orifice, and rectum were photographed. The quality                            of the bowel preparation was excellent. The                            colonoscopy was performed without difficulty. The                            patient tolerated the procedure well. The bowel                            preparation used was SUPREP via split dose                            instruction. Scope In: 3:24:40 AM Scope Out: 9:31:07 AM Scope Withdrawal Time: 0 hours 13 minutes 52 seconds  Total Procedure Duration: 0 hours 24 minutes 49 seconds  Findings:                 A 4 mm polyp was found in the ascending colon. The                            polyp was sessile. The polyp was removed with a                            cold snare. Resection and retrieval were complete.                           Multiple diverticula were found in the sigmoid  colon.                           Internal hemorrhoids were found during retroflexion.                           The exam was otherwise without abnormality on                            direct and retroflexion views. Complications:            No immediate complications. Estimated blood loss:                            None. Estimated Blood Loss:     Estimated blood loss: none. Impression:               - One 4 mm polyp in the ascending colon, removed                            with a cold snare. Resected and retrieved.                           - Diverticulosis in the sigmoid colon.                           - Internal hemorrhoids.                           - The examination was otherwise normal on direct                            and retroflexion views. Recommendation:           - Repeat colonoscopy is not recommended for                             surveillance.                           - Patient has a contact number available for                            emergencies. The signs and symptoms of potential                            delayed complications were discussed with the                            patient. Return to normal activities tomorrow.                            Written discharge instructions were provided to the                            patient.                           -  Resume previous diet.                           - Continue present medications.                           - Await pathology results. Docia Chuck. Henrene Pastor, MD 02/03/2022 9:40:53 AM This report has been signed electronically.

## 2022-02-03 NOTE — Progress Notes (Signed)
Called to room to assist during endoscopic procedure.  Patient ID and intended procedure confirmed with present staff. Received instructions for my participation in the procedure from the performing physician.  

## 2022-02-04 ENCOUNTER — Telehealth: Payer: Self-pay | Admitting: *Deleted

## 2022-02-04 NOTE — Telephone Encounter (Signed)
  Follow up Call-     02/03/2022    8:02 AM  Call back number  Post procedure Call Back phone  # [phone]  Permission to leave phone message Yes     Patient questions:  Do you have a fever, pain , or abdominal swelling? No. Pain Score  0 *  Have you tolerated food without any problems? Yes.    Have you been able to return to your normal activities? Yes.    Do you have any questions about your discharge instructions: Diet   No. Medications  No. Follow up visit  No.  Do you have questions or concerns about your Care? No.  Actions: * If pain score is 4 or above: No action needed, pain <4.

## 2022-02-04 NOTE — Telephone Encounter (Signed)
First attempt for follow up phone call. No answer at number given.  Left message on voicemail.

## 2022-02-07 ENCOUNTER — Encounter: Payer: Self-pay | Admitting: Internal Medicine

## 2022-02-22 DIAGNOSIS — H2512 Age-related nuclear cataract, left eye: Secondary | ICD-10-CM | POA: Diagnosis not present

## 2022-03-04 DIAGNOSIS — H35373 Puckering of macula, bilateral: Secondary | ICD-10-CM | POA: Diagnosis not present

## 2022-03-04 DIAGNOSIS — H3581 Retinal edema: Secondary | ICD-10-CM | POA: Diagnosis not present

## 2022-04-04 DIAGNOSIS — H35372 Puckering of macula, left eye: Secondary | ICD-10-CM | POA: Diagnosis not present

## 2022-04-04 DIAGNOSIS — H2512 Age-related nuclear cataract, left eye: Secondary | ICD-10-CM | POA: Diagnosis not present

## 2022-04-04 DIAGNOSIS — H3581 Retinal edema: Secondary | ICD-10-CM | POA: Diagnosis not present

## 2022-04-05 DIAGNOSIS — H35372 Puckering of macula, left eye: Secondary | ICD-10-CM | POA: Diagnosis not present

## 2022-04-05 DIAGNOSIS — H3581 Retinal edema: Secondary | ICD-10-CM | POA: Diagnosis not present

## 2022-04-12 DIAGNOSIS — H35373 Puckering of macula, bilateral: Secondary | ICD-10-CM | POA: Diagnosis not present

## 2022-05-04 DIAGNOSIS — H3581 Retinal edema: Secondary | ICD-10-CM | POA: Diagnosis not present

## 2022-05-04 DIAGNOSIS — H353132 Nonexudative age-related macular degeneration, bilateral, intermediate dry stage: Secondary | ICD-10-CM | POA: Diagnosis not present

## 2022-05-17 DIAGNOSIS — H353112 Nonexudative age-related macular degeneration, right eye, intermediate dry stage: Secondary | ICD-10-CM | POA: Diagnosis not present

## 2022-05-17 DIAGNOSIS — H353122 Nonexudative age-related macular degeneration, left eye, intermediate dry stage: Secondary | ICD-10-CM | POA: Diagnosis not present

## 2022-05-17 DIAGNOSIS — H35373 Puckering of macula, bilateral: Secondary | ICD-10-CM | POA: Diagnosis not present

## 2022-07-19 DIAGNOSIS — H26493 Other secondary cataract, bilateral: Secondary | ICD-10-CM | POA: Diagnosis not present

## 2022-07-19 DIAGNOSIS — H26491 Other secondary cataract, right eye: Secondary | ICD-10-CM | POA: Diagnosis not present

## 2022-07-19 DIAGNOSIS — H18413 Arcus senilis, bilateral: Secondary | ICD-10-CM | POA: Diagnosis not present

## 2022-07-19 DIAGNOSIS — Z961 Presence of intraocular lens: Secondary | ICD-10-CM | POA: Diagnosis not present

## 2022-07-19 DIAGNOSIS — H353132 Nonexudative age-related macular degeneration, bilateral, intermediate dry stage: Secondary | ICD-10-CM | POA: Diagnosis not present

## 2022-09-23 DIAGNOSIS — Z1231 Encounter for screening mammogram for malignant neoplasm of breast: Secondary | ICD-10-CM | POA: Diagnosis not present

## 2022-10-19 DIAGNOSIS — H353132 Nonexudative age-related macular degeneration, bilateral, intermediate dry stage: Secondary | ICD-10-CM | POA: Diagnosis not present

## 2022-10-19 DIAGNOSIS — H3581 Retinal edema: Secondary | ICD-10-CM | POA: Diagnosis not present

## 2022-11-16 DIAGNOSIS — H35373 Puckering of macula, bilateral: Secondary | ICD-10-CM | POA: Diagnosis not present

## 2022-11-16 DIAGNOSIS — H353131 Nonexudative age-related macular degeneration, bilateral, early dry stage: Secondary | ICD-10-CM | POA: Diagnosis not present

## 2022-11-30 DIAGNOSIS — K219 Gastro-esophageal reflux disease without esophagitis: Secondary | ICD-10-CM | POA: Diagnosis not present

## 2022-11-30 DIAGNOSIS — E7800 Pure hypercholesterolemia, unspecified: Secondary | ICD-10-CM | POA: Diagnosis not present

## 2022-11-30 DIAGNOSIS — E039 Hypothyroidism, unspecified: Secondary | ICD-10-CM | POA: Diagnosis not present

## 2022-11-30 DIAGNOSIS — F419 Anxiety disorder, unspecified: Secondary | ICD-10-CM | POA: Diagnosis not present

## 2023-04-19 DIAGNOSIS — H353132 Nonexudative age-related macular degeneration, bilateral, intermediate dry stage: Secondary | ICD-10-CM | POA: Diagnosis not present

## 2023-04-19 DIAGNOSIS — H3581 Retinal edema: Secondary | ICD-10-CM | POA: Diagnosis not present

## 2023-05-19 DIAGNOSIS — H353132 Nonexudative age-related macular degeneration, bilateral, intermediate dry stage: Secondary | ICD-10-CM | POA: Diagnosis not present

## 2023-06-06 DIAGNOSIS — C44612 Basal cell carcinoma of skin of right upper limb, including shoulder: Secondary | ICD-10-CM | POA: Diagnosis not present

## 2023-06-06 DIAGNOSIS — D225 Melanocytic nevi of trunk: Secondary | ICD-10-CM | POA: Diagnosis not present

## 2023-06-06 DIAGNOSIS — L821 Other seborrheic keratosis: Secondary | ICD-10-CM | POA: Diagnosis not present

## 2023-06-06 DIAGNOSIS — D485 Neoplasm of uncertain behavior of skin: Secondary | ICD-10-CM | POA: Diagnosis not present

## 2023-06-06 DIAGNOSIS — Z85828 Personal history of other malignant neoplasm of skin: Secondary | ICD-10-CM | POA: Diagnosis not present

## 2023-06-07 DIAGNOSIS — N958 Other specified menopausal and perimenopausal disorders: Secondary | ICD-10-CM | POA: Diagnosis not present

## 2023-06-10 DIAGNOSIS — R300 Dysuria: Secondary | ICD-10-CM | POA: Diagnosis not present

## 2023-06-10 DIAGNOSIS — N3000 Acute cystitis without hematuria: Secondary | ICD-10-CM | POA: Diagnosis not present

## 2023-06-19 DIAGNOSIS — M810 Age-related osteoporosis without current pathological fracture: Secondary | ICD-10-CM | POA: Diagnosis not present

## 2023-06-19 DIAGNOSIS — M47816 Spondylosis without myelopathy or radiculopathy, lumbar region: Secondary | ICD-10-CM | POA: Diagnosis not present

## 2023-06-19 DIAGNOSIS — F419 Anxiety disorder, unspecified: Secondary | ICD-10-CM | POA: Diagnosis not present

## 2023-06-19 DIAGNOSIS — Z0000 Encounter for general adult medical examination without abnormal findings: Secondary | ICD-10-CM | POA: Diagnosis not present

## 2023-06-19 DIAGNOSIS — Z1331 Encounter for screening for depression: Secondary | ICD-10-CM | POA: Diagnosis not present

## 2023-06-19 DIAGNOSIS — E7800 Pure hypercholesterolemia, unspecified: Secondary | ICD-10-CM | POA: Diagnosis not present

## 2023-06-19 DIAGNOSIS — K219 Gastro-esophageal reflux disease without esophagitis: Secondary | ICD-10-CM | POA: Diagnosis not present

## 2023-06-19 DIAGNOSIS — E039 Hypothyroidism, unspecified: Secondary | ICD-10-CM | POA: Diagnosis not present

## 2023-06-19 DIAGNOSIS — Z23 Encounter for immunization: Secondary | ICD-10-CM | POA: Diagnosis not present

## 2023-07-20 DIAGNOSIS — H903 Sensorineural hearing loss, bilateral: Secondary | ICD-10-CM | POA: Diagnosis not present

## 2023-07-26 DIAGNOSIS — E2839 Other primary ovarian failure: Secondary | ICD-10-CM | POA: Diagnosis not present

## 2023-07-26 DIAGNOSIS — M8588 Other specified disorders of bone density and structure, other site: Secondary | ICD-10-CM | POA: Diagnosis not present

## 2023-07-26 DIAGNOSIS — Z8262 Family history of osteoporosis: Secondary | ICD-10-CM | POA: Diagnosis not present

## 2023-08-12 ENCOUNTER — Inpatient Hospital Stay: Payer: Medicare PPO | Attending: Hematology | Admitting: Hematology

## 2023-08-12 ENCOUNTER — Inpatient Hospital Stay: Payer: Medicare PPO

## 2023-08-12 VITALS — BP 146/90 | HR 71 | Temp 97.7°F | Resp 18 | Wt 157.1 lb

## 2023-08-12 DIAGNOSIS — D472 Monoclonal gammopathy: Secondary | ICD-10-CM | POA: Insufficient documentation

## 2023-08-12 DIAGNOSIS — Z8049 Family history of malignant neoplasm of other genital organs: Secondary | ICD-10-CM | POA: Diagnosis not present

## 2023-08-12 LAB — CBC WITH DIFFERENTIAL (CANCER CENTER ONLY)
Abs Immature Granulocytes: 0.01 10*3/uL (ref 0.00–0.07)
Basophils Absolute: 0 10*3/uL (ref 0.0–0.1)
Basophils Relative: 1 %
Eosinophils Absolute: 0.1 10*3/uL (ref 0.0–0.5)
Eosinophils Relative: 2 %
HCT: 38.4 % (ref 36.0–46.0)
Hemoglobin: 12.7 g/dL (ref 12.0–15.0)
Immature Granulocytes: 0 %
Lymphocytes Relative: 32 %
Lymphs Abs: 1.7 10*3/uL (ref 0.7–4.0)
MCH: 31.4 pg (ref 26.0–34.0)
MCHC: 33.1 g/dL (ref 30.0–36.0)
MCV: 95 fL (ref 80.0–100.0)
Monocytes Absolute: 0.4 10*3/uL (ref 0.1–1.0)
Monocytes Relative: 7 %
Neutro Abs: 3.1 10*3/uL (ref 1.7–7.7)
Neutrophils Relative %: 58 %
Platelet Count: 229 10*3/uL (ref 150–400)
RBC: 4.04 MIL/uL (ref 3.87–5.11)
RDW: 13.2 % (ref 11.5–15.5)
WBC Count: 5.4 10*3/uL (ref 4.0–10.5)
nRBC: 0 % (ref 0.0–0.2)

## 2023-08-12 LAB — CMP (CANCER CENTER ONLY)
ALT: 20 U/L (ref 0–44)
AST: 21 U/L (ref 15–41)
Albumin: 4.2 g/dL (ref 3.5–5.0)
Alkaline Phosphatase: 76 U/L (ref 38–126)
Anion gap: 6 (ref 5–15)
BUN: 18 mg/dL (ref 8–23)
CO2: 26 mmol/L (ref 22–32)
Calcium: 10.5 mg/dL — ABNORMAL HIGH (ref 8.9–10.3)
Chloride: 108 mmol/L (ref 98–111)
Creatinine: 0.69 mg/dL (ref 0.44–1.00)
GFR, Estimated: >60 mL/min (ref >60)
Glucose, Bld: 89 mg/dL (ref 70–99)
Potassium: 4.1 mmol/L (ref 3.5–5.1)
Sodium: 140 mmol/L (ref 135–145)
Total Bilirubin: 0.4 mg/dL (ref <1.2)
Total Protein: 6.7 g/dL (ref 6.5–8.1)

## 2023-08-12 LAB — LACTATE DEHYDROGENASE: LDH: 186 U/L (ref 98–192)

## 2023-08-12 LAB — SEDIMENTATION RATE: Sed Rate: 7 mm/h (ref 0–22)

## 2023-08-12 NOTE — Progress Notes (Signed)
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 female who has been referred to [place] by Georgann Housekeeper, MD for evaluation and management of M spike on UPEP and hypercalcemia.  Today she is accompanied by her husband and daughter-in-law, who help provide history. Patient reports that her PCP is Dr. Katrinka Blazing. Patient reports that her calcium has been elevated over the last couple of months.   She denies any lumps/bumps, fever, chills, night sweats, unexplained weight loss, or night sweats. Patient reports normal eating habits. Patient was noted to be hard of hearing.   She complains of generalized aching after activity present over the last couple of years. If she rests, her symptoms resolve. She denies any  localized discomfort to a particular area. She denies any spine pain with palpation.  Patient has had falls in the past and has had compression fractures in the spine with pain in those areas. No lytics lesions were found on imaging. Patient reports that she last received imaging of the back 6-12 months ago.   She reports that she cannot lay on her right side as pressure to her hip causes pain. This symptom has been present  for a while and is stable.   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 Patient denies any major inctions such as COVID-19 or flu.  Patient has no known kidney disease or autoimmune conditions such as RA or lupus. Her daughter-in-law r(Dr Jewelianna Braum) reports that patient's son has Hoshimoto's thyroiditis and it is possible that patient may also have it. Patient does take thyroid medication.   Patient reports that she was recently diagnosed with Osteoporosis  and has not yet started treatment for it. Her daughter-in-law reports that patient is currently in the prior authorization stage for Prolia. Patient did use Fosamax a while ago.   She complains of brain fog, balance issues, and mild dizziness. Patient has had balance issues for several years. She has tried  balance physical therapy in the past.   Patient reports that she is constantly cold.   She reports that she is currently in the beginning stages of macular degeneration.   She generally does not drink any water and does drink caffeine regularly.    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; Dr Yetta Barre   Fracture    right 4th and 5th metacarpal   GERD (gastroesophageal reflux disease)    Hearing aid worn    B/L   HOH (hard of hearing)    Hyperlipidemia    Hypothyroidism    Osteopenia after menopause    Pneumonia    Uterine prolapse    Wears glasses     SURGICAL HISTORY: Past Surgical History:  Procedure Laterality Date   CATARACT EXTRACTION Right    COLONOSCOPY  09/05/2010   neg; [facility]   HEMORRHOID SURGERY     OPEN REDUCTION INTERNAL FIXATION (ORIF) METACARPAL Right 04/10/2020   Procedure: OPEN REDUCTION INTERNAL FIXATION (ORIF)  AS NECESSARY RIGHT FOURTH AND FIFTH METACARPAL FRACTURES;  Surgeon: Dominica Severin, MD;  Location: [facility] OR;  Service: Orthopedics;  Laterality: Right;  OPEN  REDUCTION INTERNAL FIXATION (ORIF)  AS NECESSARY RIGHT FOURTH AND FIFTH METACARPAL FRACTURES    SOCIAL HISTORY: Social History   Socioeconomic History   Marital status: Married    Spouse name: Not on file   Number of children: 2   Years of education: Not on file   Highest education level: Not on file  Occupational History   Not on file  Tobacco Use   Smoking status: Never   Smokeless tobacco: Never  Vaping Use   Vaping status: Never Used  Substance and Sexual Activity   Alcohol use: No     Alcohol/week: 0.0 standard drinks of alcohol   Drug use: No   Sexual activity: Not on file  Other Topics Concern   Not on file  Social History Narrative   Lives with spouse;   One son in [place]   Dtr in [place]   2 grand children   2 twin girl; in [place] in July (11)   Social Determinants of Health   Financial Resource Strain: Low Risk  (07/12/2018)   Overall Financial Resource Strain (CARDIA)    Difficulty of Paying Living Expenses: Not hard at all  Food Insecurity: No Food Insecurity (07/12/2018)   Hunger Vital Sign    Worried About Running Out of Food in the Last Year: Never true    Ran Out of Food in the Last Year: Never true  Transportation Needs: No Transportation Needs (07/12/2018)   PRAPARE - Administrator, Civil Service (Medical): No    Lack of Transportation (Non-Medical): No  Physical Activity: Inactive (07/12/2018)   Exercise Vital Sign    Days of Exercise per Week: 0 days    Minutes of Exercise per Session: 0 min  Stress: No Stress Concern Present (07/12/2018)   [employer] of Occupational Health - Occupational Stress Questionnaire    Feeling of Stress : Not at all  Social Connections: Socially Integrated (07/12/2018)   Social Connection and Isolation Panel [NHANES]    Frequency of Communication with Friends and Family: More than three times a week    Frequency of Social Gatherings with Friends and Family: More than three times a week    Attends Religious Services: More than 4 times per year    Active Member of [employer] or Organizations: Yes    Attends Engineer, structural: More than 4 times per year    Marital Status: Married  Catering manager Violence: Not At Risk (07/12/2018)   Humiliation, Afraid, Rape, and Kick questionnaire    Fear of Current or Ex-Partner: No    Emotionally Abused: No    Physically Abused: No    Sexually Abused: No    FAMILY HISTORY: Family History  Problem Relation Age of Onset   Heart attack Mother        in  46s   Uterine cancer Mother    Heart attack Father        in 48s   Coronary artery disease Brother        stent in 34s   Other Brother        ? leukemia   Stroke Maternal Grandmother    Heart attack Maternal Grandfather        in 68s   Diabetes Neg Hx    Colon cancer Neg Hx    Esophageal cancer Neg Hx    Rectal cancer Neg Hx    Stomach cancer Neg Hx     ALLERGIES:  is allergic to celecoxib and sulfonamide  derivatives.  MEDICATIONS:  Current Outpatient Medications  Medication Sig Dispense Refill   atorvastatin (LIPITOR) 20 MG tablet Take 1 tablet by mouth daily. (Patient taking differently: Take 20 mg by mouth daily.) 90 tablet 3   Cholecalciferol (VITAMIN D) 50 MCG (2000 UT) tablet Take 2,000 Units by mouth daily.     FLUoxetine (PROZAC) 40 MG capsule TAKE 1 CAPSULE BY MOUTH EVERY DAY (Patient taking differently: Take 40 mg by mouth daily.) 90 capsule 3   gatifloxacin (ZYMAXID) 0.5 % SOLN Place 1 drop into the right eye 3 (three) times daily.     ketorolac (ACULAR) 0.5 % ophthalmic solution SMARTSIG:In Eye(s)     levothyroxine (SYNTHROID, LEVOTHROID) 25 MCG tablet TAKE 1 TABLET BY MOUTH BEFORE BREAKFAST (Patient taking differently: Take 25 mcg by mouth daily before breakfast.) 90 tablet 3   Multiple Vitamins-Minerals (PRESERVISION AREDS) CAPS Take 1 capsule by mouth daily. (Patient not taking: Reported on 02/03/2022)     naproxen sodium (ALEVE) 220 MG tablet Take 220 mg by mouth daily as needed (pain).     nystatin-triamcinolone (MYCOLOG II) cream Apply 1 application topically daily as needed (Vaginal itcing).      pantoprazole (PROTONIX) 40 MG tablet Take 40 mg by mouth daily.     prednisoLONE acetate (PRED FORTE) 1 % ophthalmic suspension Place 1 drop into the right eye 4 (four) times daily.     PREVIDENT 5000 DRY MOUTH 1.1 % GEL dental gel Take by mouth. (Patient not taking: Reported on 02/03/2022)     SYSTANE ULTRA 0.4-0.3 % SOLN SMARTSIG:1 Drop(s) In Eye(s) PRN     timolol  (TIMOPTIC) 0.5 % ophthalmic solution SMARTSIG:In Eye(s)     No current facility-administered medications for this visit.    REVIEW OF SYSTEMS:    10 Point review of Systems was done is negative except as noted above.  PHYSICAL EXAMINATION: ECOG PERFORMANCE STATUS: 1 - Symptomatic but completely ambulatory  . Vitals:   08/12/23 1050  BP: (!) 146/90  Pulse: 71  Resp: 18  Temp: 97.7 F (36.5 C)  SpO2: 97%   Filed Weights   08/12/23 1050  Weight: 157 lb 1.6 oz (71.3 kg)   .Body mass index is 28.73 kg/m.  GENERAL:alert, in no acute distress and comfortable SKIN: no acute rashes, no significant lesions EYES: conjunctiva are pink and non-injected, sclera anicteric OROPHARYNX: MMM, no exudates, no oropharyngeal erythema or ulceration NECK: supple, no JVD LYMPH:  no palpable lymphadenopathy in the cervical, axillary or inguinal regions LUNGS: clear to auscultation b/l with normal respiratory effort HEART: regular rate & rhythm ABDOMEN:  normoactive bowel sounds , non tender, not distended. Extremity: no pedal edema PSYCH: alert & oriented x 3 with fluent speech NEURO: no focal motor/sensory deficits  LABORATORY DATA:  I have reviewed the data as listed                .            Latest Ref Rng & Units 08/12/2023   12:02 PM 04/10/2020    6:28 AM 10/04/2018    8:26 AM  CBC  WBC 4.0 - 10.5 K/uL 5.4  4.9  4.6   Hemoglobin 12.0 - 15.0 g/dL 16.1  09.6  04.5   Hematocrit 36.0 - 46.0 % 38.4  36.1  41.6   Platelets 150 - 400 K/uL 229  189  237.0     .    Latest Ref Rng & Units 08/12/2023   12:02 PM 10/04/2018  8:26 AM 07/11/2017   10:10 AM  CMP  Glucose 70 - 99 mg/dL 89  88  96   BUN 8 - 23 mg/dL 18  16  17    Creatinine 0.44 - 1.00 mg/dL 4.09  8.11  9.14   Sodium 135 - 145 mmol/L 140  141  139   Potassium 3.5 - 5.1 mmol/L 4.1  4.4  4.5   Chloride 98 - 111 mmol/L 108  105  104   CO2 22 - 32 mmol/L 26  28  28    Calcium 8.9 - 10.3 mg/dL 78.2  95.6   21.3   Total Protein 6.5 - 8.1 g/dL 6.7  6.6  7.1   Total Bilirubin <1.2 mg/dL 0.4  0.5  0.5   Alkaline Phos 38 - 126 U/L 76  65  63   AST 15 - 41 U/L 21  15  14    ALT 0 - 44 U/L 20  15  12       RADIOGRAPHIC STUDIES: I have personally reviewed the radiological images as listed and agreed with the findings in the report. No results found.  ASSESSMENT & PLAN:   77 y.o. female with:  Hypercalcemia level of 11.1-- could be from dehydration. Patient notes limited po water intake. 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 r/o plasma cell neoplasm  PLAN:  -Calcium levels were elevated at 11.1 -educated patient that possible causes of elevated light chains - include chronic viral infections, acute infections, and differential secretions of light chains -educated patient that sometimes elevated calcium could be based on activity  -Her last 24 hour urine testing showed a small amount of monoclonal protein in the urine at 12 mg  -patient has no anemia or worsening kidney function -educated patient that abnormal protein may be due to infections, change in balance of plasma cells -will complete testing to evaluate for any signs of a disorder of plasma cells or bone marrow disorder -will evaluate her light chains in the blood -will order blood tests, urine test, PET scan, and potentially bone marrow biopsy down the line if there is enough concern -recommend patient to drink at least 64 ounces of water daily -patient's bone pain could be from osteoporosis or possibly arthritis -answered all of patient's and her family member's questions in detail  . Orders Placed This Encounter  Procedures   NM PET Image Initial (PI) Whole Body    Standing Status:   Future    Expected Date:   08/17/2023    Expiration Date:   08/11/2024    If indicated for the ordered procedure, I authorize the administration of a radiopharmaceutical per Radiology protocol:   Yes    Preferred imaging location?:   [place]   CBC with  Differential (Cancer Center Only)    Standing Status:   Future    Number of Occurrences:   1    Expected Date:   08/12/2023    Expiration Date:   08/11/2024   CMP (Cancer Center only)    Standing Status:   Future    Number of Occurrences:   1    Expected Date:   08/12/2023    Expiration Date:   08/11/2024   Multiple Myeloma Panel (SPEP&IFE w/QIG)    Standing Status:   Future    Number of Occurrences:   1    Expected Date:   08/12/2023    Expiration Date:   08/11/2024   Kappa/lambda light chains    Standing Status:   Future  Number of Occurrences:   1    Expected Date:   08/12/2023    Expiration Date:   08/11/2024   24-Hr Ur UPEP/UIFE/Light Chains/TP    Standing Status:   Future    Number of Occurrences:   1    Expected Date:   08/12/2023    Expiration Date:   08/11/2024   Beta 2 microglobulin, serum    Standing Status:   Future    Number of Occurrences:   1    Expected Date:   08/12/2023    Expiration Date:   08/11/2024   Sedimentation rate    Standing Status:   Future    Number of Occurrences:   1    Expected Date:   08/12/2023    Expiration Date:   08/11/2024   Lactate dehydrogenase    Standing Status:   Future    Number of Occurrences:   1    Expected Date:   08/12/2023    Expiration Date:   08/11/2024   Calcium, ionized    Standing Status:   Future    Number of Occurrences:   1    Expected Date:   08/12/2023    Expiration Date:   08/11/2024    FOLLOW-UP: Labs today PET/CT in 5-7 days RTC with Dr Candise Che in 2-3 weeks  The total time spent in the appointment was 45 minutes* .  All of the patient's questions were answered with apparent satisfaction. The patient knows to call the clinic with any problems, questions or concerns.   Wyvonnia Lora MD MS AAHIVMS [facility] Hematology/Oncology Physician [employer]  .*Total Encounter Time as defined by the Centers for Medicare and Medicaid Services includes, in addition to the face-to-face time of a patient visit  (documented in the note above) non-face-to-face time: obtaining and reviewing outside history, ordering and reviewing medications, tests or procedures, care coordination (communications with other health care professionals or caregivers) and documentation in the medical record.    I,Mitra Faeizi,acting as a Neurosurgeon for Wyvonnia Lora, MD.,have documented all relevant documentation on the behalf of Wyvonnia Lora, MD,as directed by  Wyvonnia Lora, MD while in the presence of Wyvonnia Lora, MD.  .I have reviewed the above documentation for accuracy and completeness, and I agree with the above. Johney Maine MD   ADDENDUM  Calcium levels today high normal 10.5 with normal ionized calcium levels of 5.5

## 2023-08-14 LAB — CALCIUM, IONIZED: Calcium, Ionized, Serum: 5.5 mg/dL (ref 4.5–5.6)

## 2023-08-14 LAB — KAPPA/LAMBDA LIGHT CHAINS
Kappa free light chain: 113 mg/L — ABNORMAL HIGH (ref 3.3–19.4)
Kappa, lambda light chain ratio: 8.69 — ABNORMAL HIGH (ref 0.26–1.65)
Lambda free light chains: 13 mg/L (ref 5.7–26.3)

## 2023-08-14 LAB — BETA 2 MICROGLOBULIN, SERUM: Beta-2 Microglobulin: 1.8 mg/L (ref 0.6–2.4)

## 2023-08-16 ENCOUNTER — Other Ambulatory Visit: Payer: Self-pay | Admitting: *Deleted

## 2023-08-16 DIAGNOSIS — Z8049 Family history of malignant neoplasm of other genital organs: Secondary | ICD-10-CM | POA: Diagnosis not present

## 2023-08-16 DIAGNOSIS — D472 Monoclonal gammopathy: Secondary | ICD-10-CM | POA: Diagnosis not present

## 2023-08-16 LAB — MULTIPLE MYELOMA PANEL, SERUM
Albumin SerPl Elph-Mcnc: 3.6 g/dL (ref 2.9–4.4)
Albumin/Glob SerPl: 1.3 (ref 0.7–1.7)
Alpha 1: 0.2 g/dL (ref 0.0–0.4)
Alpha2 Glob SerPl Elph-Mcnc: 0.7 g/dL (ref 0.4–1.0)
B-Globulin SerPl Elph-Mcnc: 1 g/dL (ref 0.7–1.3)
Gamma Glob SerPl Elph-Mcnc: 0.8 g/dL (ref 0.4–1.8)
Globulin, Total: 2.8 g/dL (ref 2.2–3.9)
IgA: 95 mg/dL (ref 64–422)
IgG (Immunoglobin G), Serum: 832 mg/dL (ref 586–1602)
IgM (Immunoglobulin M), Srm: 70 mg/dL (ref 26–217)
Total Protein ELP: 6.4 g/dL (ref 6.0–8.5)

## 2023-08-18 LAB — UPEP/UIFE/LIGHT CHAINS/TP, 24-HR UR
% BETA, Urine: 21.6 %
ALPHA 1 URINE: 6.7 %
Albumin, U: 23.3 %
Alpha 2, Urine: 19.4 %
Free Kappa Lt Chains,Ur: 146.1 mg/L — ABNORMAL HIGH (ref 1.17–86.46)
Free Kappa/Lambda Ratio: 22.97 — ABNORMAL HIGH (ref 1.83–14.26)
Free Lambda Lt Chains,Ur: 6.36 mg/L (ref 0.27–15.21)
GAMMA GLOBULIN URINE: 28.9 %
M-SPIKE %, Urine: 14.3 % — ABNORMAL HIGH
M-Spike, Mg/24 Hr: 15 mg/(24.h) — ABNORMAL HIGH
Total Protein, Urine-Ur/day: 106 mg/(24.h) (ref 30–150)
Total Protein, Urine: 8.5 mg/dL
Total Volume: 1250

## 2023-08-23 DIAGNOSIS — F329 Major depressive disorder, single episode, unspecified: Secondary | ICD-10-CM | POA: Diagnosis not present

## 2023-08-23 DIAGNOSIS — F419 Anxiety disorder, unspecified: Secondary | ICD-10-CM | POA: Diagnosis not present

## 2023-08-23 DIAGNOSIS — H3530 Unspecified macular degeneration: Secondary | ICD-10-CM | POA: Diagnosis not present

## 2023-08-23 DIAGNOSIS — M1990 Unspecified osteoarthritis, unspecified site: Secondary | ICD-10-CM | POA: Diagnosis not present

## 2023-08-23 DIAGNOSIS — M419 Scoliosis, unspecified: Secondary | ICD-10-CM | POA: Diagnosis not present

## 2023-08-23 DIAGNOSIS — R030 Elevated blood-pressure reading, without diagnosis of hypertension: Secondary | ICD-10-CM | POA: Diagnosis not present

## 2023-08-23 DIAGNOSIS — E785 Hyperlipidemia, unspecified: Secondary | ICD-10-CM | POA: Diagnosis not present

## 2023-08-23 DIAGNOSIS — K219 Gastro-esophageal reflux disease without esophagitis: Secondary | ICD-10-CM | POA: Diagnosis not present

## 2023-08-23 DIAGNOSIS — E039 Hypothyroidism, unspecified: Secondary | ICD-10-CM | POA: Diagnosis not present

## 2023-08-25 ENCOUNTER — Encounter (HOSPITAL_COMMUNITY)
Admission: RE | Admit: 2023-08-25 | Discharge: 2023-08-25 | Disposition: A | Discharge status: Home / Self Care | Payer: Medicare PPO | Source: Ambulatory Visit | Attending: Hematology

## 2023-08-25 DIAGNOSIS — D472 Monoclonal gammopathy: Secondary | ICD-10-CM | POA: Insufficient documentation

## 2023-08-25 DIAGNOSIS — C9000 Multiple myeloma not having achieved remission: Secondary | ICD-10-CM | POA: Diagnosis not present

## 2023-08-25 LAB — GLUCOSE, CAPILLARY: Glucose-Capillary: 87 mg/dL (ref 70–99)

## 2023-08-25 MED ORDER — FLUDEOXYGLUCOSE F - 18 (FDG) INJECTION
7.7000 | Freq: Once | INTRAVENOUS | Status: AC | PRN
  Administered 2023-08-25: 7.7 via INTRAVENOUS

## 2023-09-11 ENCOUNTER — Inpatient Hospital Stay: Payer: Medicare PPO | Attending: Hematology | Admitting: Hematology

## 2023-09-11 DIAGNOSIS — R918 Other nonspecific abnormal finding of lung field: Secondary | ICD-10-CM

## 2023-09-11 DIAGNOSIS — Z8049 Family history of malignant neoplasm of other genital organs: Secondary | ICD-10-CM | POA: Insufficient documentation

## 2023-09-11 DIAGNOSIS — D472 Monoclonal gammopathy: Secondary | ICD-10-CM | POA: Diagnosis not present

## 2023-09-11 DIAGNOSIS — D7589 Other specified diseases of blood and blood-forming organs: Secondary | ICD-10-CM | POA: Insufficient documentation

## 2023-09-11 NOTE — Progress Notes (Signed)
 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  HISTORY OF PRESENTING ILLNESS:   Audrey [name] is a wonderful 78 y.o. female who has been referred to us  by Ransom Other, MD for evaluation and management of M spike on UPEP and hypercalcemia.  Today she is accompanied by her husband and daughter-in-law, who help provide history. Patient reports that her PCP is Dr. Claudene. Patient reports that her calcium  has been elevated over the last couple of months.   She denies any lumps/bumps, fever, chills, night sweats, unexplained weight loss, or night sweats. Patient reports normal eating habits. Patient was noted to be hard of hearing.   She complains of generalized aching after activity present over the last couple of years. If she rests, her symptoms resolve. She denies any  localized discomfort to a particular area. She denies any spine pain with palpation.  Patient has had falls in the past and has had compression fractures in the spine with pain in those areas. No lytics lesions were found on imaging. Patient reports that she last received imaging of the back 6-12 months ago.   She reports that she cannot lay on her right side as pressure to her hip causes pain. This symptom has been present  for a while and is stable.   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 Patient denies any major inctions such as COVID-19 or flu.  Patient has no known kidney disease or autoimmune conditions such as RA or lupus. Her daughter-in-law r(Dr Breann Losano) reports that patient's son has Hoshimoto's thyroiditis and it is possible that patient may also have it. Patient does take thyroid  medication.   Patient  reports that she was recently diagnosed with Osteoporosis and has not yet started treatment for it. Her daughter-in-law reports that patient is currently in the prior authorization stage for Prolia. Patient did use Fosamax a while ago.   She complains of brain fog, balance issues, and mild dizziness. Patient has had balance issues for several years. She has tried  balance physical therapy in the past.   Patient reports that she is constantly cold.   She reports that she is currently in the beginning stages of macular degeneration.   She generally does not drink any water  and does drink caffeine regularly.   INTERVAL HISTORY  ..I connected with Audrey [name] on 09/11/2023 at 10:00 AM EST by telephone visit and verified that I am speaking with the correct person using two identifiers.   I discussed the limitations, risks, security and privacy concerns of performing an evaluation and management service by telemedicine and the availability of in-person appointments. I also discussed with the patient that there may be a patient responsible charge related to this service. The patient expressed understanding and agreed to proceed.   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 Patient notes no new symptoms since her last clinic visit.  We discussed all her lab results in details which shows clonal kappa light chain excess concerning for  possible plasma cell dyscrasia. We also discussed her PET/CT scan which shows an FDG avid right middle lobe lung nodule concerning for primary bronchogenic carcinoma.  No evidence of FDG avid multiple myeloma lesions in the bones.   MEDICAL HISTORY:  Past Medical History:  Diagnosis Date   Arthritis     Cancer (HCC)    basal cell cancer on face removed; Dr Joshua   Fracture    right 4th and 5th metacarpal   GERD (gastroesophageal reflux disease)    Hearing aid worn    B/L   HOH (hard of hearing)    Hyperlipidemia    Hypothyroidism    Osteopenia after menopause    Pneumonia    Uterine prolapse    Wears glasses     SURGICAL HISTORY: Past Surgical History:  Procedure Laterality Date   CATARACT EXTRACTION Right    COLONOSCOPY  09/05/2010   neg; [facility]   HEMORRHOID SURGERY     OPEN REDUCTION INTERNAL FIXATION (ORIF) METACARPAL Right 04/10/2020   Procedure: OPEN REDUCTION INTERNAL FIXATION (ORIF)  AS NECESSARY RIGHT FOURTH AND FIFTH METACARPAL FRACTURES;  Surgeon: Camella Fallow, MD;  Location: [facility] OR;  Service: Orthopedics;  Laterality: Right;  OPEN REDUCTION INTERNAL FIXATION (ORIF)  AS NECESSARY RIGHT FOURTH AND FIFTH METACARPAL FRACTURES    SOCIAL HISTORY: Social History   Socioeconomic History   Marital status: Married    Spouse name: Not on file   Number of children: 2   Years of education: Not on file   Highest education level: Not on file  Occupational History   Not on file  Tobacco Use   Smoking status: Never   Smokeless tobacco: Never  Vaping Use   Vaping status: Never Used  Substance and Sexual Activity   Alcohol  use: No    Alcohol /week: 0.0 standard drinks of alcohol    Drug use: No   Sexual activity: Not on file  Other Topics Concern   Not on file  Social History Narrative   Lives with spouse;   One son in [place]   Dtr in [place]   2 grand children   2 twin girl; in [place] in July (10)   Social Drivers of Health   Financial Resource Strain: Low Risk  (07/12/2018)   Overall Financial Resource Strain (CARDIA)    Difficulty of Paying Living Expenses: Not hard at all  Food Insecurity: No Food Insecurity (07/12/2018)   Hunger Vital Sign    Worried About Running Out of Food in the Last Year: Never true    Ran Out of Food in the Last Year: Never  true  Transportation Needs: No Transportation Needs (07/12/2018)   PRAPARE - Administrator, Civil Service (Medical): No    Lack of Transportation (Non-Medical): No  Physical Activity: Inactive (07/12/2018)   Exercise Vital Sign    Days of Exercise per Week: 0 days    Minutes of Exercise per Session: 0 min  Stress: No Stress Concern Present (07/12/2018)   [employer] of Occupational Health - Occupational Stress Questionnaire    Feeling of Stress : Not at all  Social Connections: Socially Integrated (07/12/2018)   Social Connection and Isolation Panel [NHANES]    Frequency of Communication with Friends and Family: More than three times a week    Frequency of Social Gatherings with Friends and Family: More than three times a week    Attends Religious Services: More than 4 times per year    Active Member of Clubs or  Organizations: Yes    Attends Engineer, Structural: More than 4 times per year    Marital Status: Married  Catering Manager Violence: Not At Risk (07/12/2018)   Humiliation, Afraid, Rape, and Kick questionnaire    Fear of Current or Ex-Partner: No    Emotionally Abused: No    Physically Abused: No    Sexually Abused: No    FAMILY HISTORY: Family History  Problem Relation Age of Onset   Heart attack Mother        in 34s   Uterine cancer Mother    Heart attack Father        in 32s   Coronary artery disease Brother        stent in 49s   Other Brother        ? leukemia   Stroke Maternal Grandmother    Heart attack Maternal Grandfather        in 65s   Diabetes Neg Hx    Colon cancer Neg Hx    Esophageal cancer Neg Hx    Rectal cancer Neg Hx    Stomach cancer Neg Hx     ALLERGIES:  is allergic to celecoxib and sulfonamide derivatives.  MEDICATIONS:  Current Outpatient Medications  Medication Sig Dispense Refill   atorvastatin  (LIPITOR) 20 MG tablet Take 1 tablet by mouth daily. (Patient taking differently: Take 20 mg by mouth daily.) 90  tablet 3   Cholecalciferol (VITAMIN D ) 50 MCG (2000 UT) tablet Take 2,000 Units by mouth daily.     FLUoxetine  (PROZAC ) 40 MG capsule TAKE 1 CAPSULE BY MOUTH EVERY DAY (Patient taking differently: Take 40 mg by mouth daily.) 90 capsule 3   gatifloxacin (ZYMAXID) 0.5 % SOLN Place 1 drop into the right eye 3 (three) times daily.     ketorolac  (ACULAR ) 0.5 % ophthalmic solution SMARTSIG:In Eye(s)     levothyroxine  (SYNTHROID , LEVOTHROID) 25 MCG tablet TAKE 1 TABLET BY MOUTH BEFORE BREAKFAST (Patient taking differently: Take 25 mcg by mouth daily before breakfast.) 90 tablet 3   Multiple Vitamins-Minerals (PRESERVISION AREDS) CAPS Take 1 capsule by mouth daily. (Patient not taking: Reported on 02/03/2022)     naproxen sodium (ALEVE) 220 MG tablet Take 220 mg by mouth daily as needed (pain).     nystatin -triamcinolone (MYCOLOG II) cream Apply 1 application topically daily as needed (Vaginal itcing).      pantoprazole  (PROTONIX ) 40 MG tablet Take 40 mg by mouth daily.     prednisoLONE acetate (PRED FORTE) 1 % ophthalmic suspension Place 1 drop into the right eye 4 (four) times daily.     PREVIDENT 5000 DRY MOUTH 1.1 % GEL dental gel Take by mouth. (Patient not taking: Reported on 02/03/2022)     SYSTANE ULTRA 0.4-0.3 % SOLN SMARTSIG:1 Drop(s) In Eye(s) PRN     timolol (TIMOPTIC) 0.5 % ophthalmic solution SMARTSIG:In Eye(s)     No current facility-administered medications for this visit.    REVIEW OF SYSTEMS:    negative except as noted above.  PHYSICAL EXAMINATION: Telemedicine visit LABORATORY DATA:  I have reviewed the data as listed                .            Latest Ref Rng & Units 08/12/2023   12:02 PM 04/10/2020    6:28 AM 10/04/2018    8:26 AM  CBC  WBC 4.0 - 10.5 K/uL 5.4  4.9  4.6   Hemoglobin 12.0 - 15.0  g/dL 87.2  88.3  86.1   Hematocrit 36.0 - 46.0 % 38.4  36.1  41.6   Platelets 150 - 400 K/uL 229  189  237.0     .    Latest Ref Rng & Units 08/12/2023    12:02 PM 10/04/2018    8:26 AM 07/11/2017   10:10 AM  CMP  Glucose 70 - 99 mg/dL 89  88  96   BUN 8 - 23 mg/dL 18  16  17    Creatinine 0.44 - 1.00 mg/dL 9.30  9.31  9.36   Sodium 135 - 145 mmol/L 140  141  139   Potassium 3.5 - 5.1 mmol/L 4.1  4.4  4.5   Chloride 98 - 111 mmol/L 108  105  104   CO2 22 - 32 mmol/L 26  28  28    Calcium  8.9 - 10.3 mg/dL 89.4  89.4  89.5   Total Protein 6.5 - 8.1 g/dL 6.7  6.6  7.1   Total Bilirubin <1.2 mg/dL 0.4  0.5  0.5   Alkaline Phos 38 - 126 U/L 76  65  63   AST 15 - 41 U/L 21  15  14    ALT 0 - 44 U/L 20  15  12     Component     Latest Ref Rng 08/12/2023 08/16/2023  Total Protein, Urine-UPE24     Not Estab. mg/dL  8.5   Total Protein, Urine-Ur/day     30 - 150 mg/24 hr  106   ALBUMIN , U     %  23.3   ALPHA 1 URINE     %  6.7   Alpha 2, Urine     %  19.4   % BETA, Urine     %  21.6   GAMMA GLOBULIN URINE     %  28.9   Free Kappa Lt Chains,Ur     1.17 - 86.46 mg/L  146.10 (H)   Free Lambda Lt Chains,Ur     0.27 - 15.21 mg/L  6.36 (C)  Free Kappa/Lambda Ratio     1.83 - 14.26   22.97 (H) (C)  Immunofixation Result, Urine  Comment ! (C)  Total Volume  1,250   M-SPIKE %, Urine     Not Observed %  14.3 (H) (C)  M-Spike, mg/24 hr     Not Observed mg/24 hr  15 (H) (C)  NOTE:  Comment (C)  IgG (Immunoglobin G), Serum     586 - 1,602 mg/dL 167    IgA     64 - 577 mg/dL 95    IgM (Immunoglobulin M), Srm     26 - 217 mg/dL 70    Total Protein ELP     6.0 - 8.5 g/dL 6.4 (C)   Albumin  SerPl Elph-Mcnc     2.9 - 4.4 g/dL 3.6 (C)   Alpha 1     0.0 - 0.4 g/dL 0.2 (C)   Alpha2 Glob SerPl Elph-Mcnc     0.4 - 1.0 g/dL 0.7 (C)   B-Globulin SerPl Elph-Mcnc     0.7 - 1.3 g/dL 1.0 (C)   Gamma Glob SerPl Elph-Mcnc     0.4 - 1.8 g/dL 0.8 (C)   M Protein SerPl Elph-Mcnc     Not Observed g/dL Not Observed (C)   Globulin, Total     2.2 - 3.9 g/dL 2.8 (C)   Albumin /Glob SerPl     0.7 - 1.7  1.3 (C)  IFE 1 Comment (C)   Please Note (HCV):  Comment (C)   Kappa free light chain     3.3 - 19.4 mg/L 113.0 (H)    Lambda free light chains     5.7 - 26.3 mg/L 13.0    Kappa, lambda light chain ratio     0.26 - 1.65  8.69 (H)    Calcium , Ionized, Serum     4.5 - 5.6 mg/dL 5.5    LDH     98 - 807 U/L 186    Sed Rate     0 - 22 mm/hr 7    Beta-2  Microglobulin     0.6 - 2.4 mg/L 1.8      Legend: (H) High ! Abnormal (C) Corrected   RADIOGRAPHIC STUDIES: I have personally reviewed the radiological images as listed and agreed with the findings in the report. NM PET Image Initial (PI) Whole Body Result Date: 09/08/2023 CLINICAL DATA:  Initial treatment strategy for multiple myeloma. EXAM: NUCLEAR MEDICINE PET WHOLE BODY TECHNIQUE: 7.8 mCi F-18 FDG was injected intravenously. Full-ring PET imaging was performed from the head to foot after the radiotracer. CT data was obtained and used for attenuation correction and anatomic localization. Fasting blood glucose:  mg/dl COMPARISON:  CT chest 91/96/7978. FINDINGS: Mediastinal blood pool activity: SUV max 2.9 HEAD/NECK: No abnormal hypermetabolism. Incidental CT findings: None. CHEST: Hypermetabolic perihilar right middle lobe nodule measures 2.4 x 2.8 cm (7/32), SUV max 8.4. Uniform thyroid  hypermetabolism. No additional abnormal hypermetabolism. Incidental CT findings: Atherosclerotic calcification of the aorta. Heart is enlarged. No pericardial or pleural effusion. ABDOMEN/PELVIS: No abnormal hypermetabolism. Incidental CT findings: Gallstones. Small low-attenuation lesion in the right kidney. Moderate to large hiatal hernia. Probable fibroid uterus. SKELETON: No abnormal hypermetabolism. Incidental CT findings: Degenerative changes in the spine. EXTREMITIES: No abnormal hypermetabolism. Incidental CT findings: None. IMPRESSION: 1. No evidence of FDG avid multiple myeloma. 2. Hypermetabolic right middle lobe nodule, indicative of primary bronchogenic carcinoma. These results will be called to the  ordering clinician or representative by the Radiologist Assistant, and communication documented in the PACS or [employer]. 3. Uniform thyroid  hypermetabolism. Consider laboratory correlation. 4. Cholelithiasis. 5. Moderate to large hiatal hernia. 6. Fibroid uterus. 7.  Aortic atherosclerosis (ICD10-I70.0). Electronically Signed   By: Newell Eke M.D.   On: 09/08/2023 14:48    ASSESSMENT & PLAN:   78 y.o. 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  Hypercalcemia this has improved on repeat labs and is down to 10.5 with normal ionized calcium  of 5.5. This could have been from dehydration but patient is also being worked up for plasma cell dyscrasia and also has an FDG avid right middle lobe lung lesion which is squamous cell carcinoma could also be a factor  3.  FDG avid right middle lobe lung lesion concerning for primary bronchogenic carcinoma.  No overt lymphadenopathy noted. PLAN:  -All the patient's labs were discussed in detail with the patient and her daughter-in-law Dr. Vernell [name] on the phone. -There is some evidence of a kappa light chain secreting plasma cell dyscrasia though the patient at this time does not have any overt anemia, renal dysfunction, FDG avid bone tumors and the hypercalcemia has resolved.  Possible light chain MGUS versus smoldering myeloma. -Discussed PET scan showing no FDG avid bone lesions. -For further workup of the kappa light chain clonality we recommended and the patient is agreeable  to setting up a bone marrow aspiration and biopsy which has been requested. -We also discussed that the PET scan incidentally notes right middle lobe FDG avid lesion concerning for primary bronchogenic carcinoma.  This sometimes can contribute to the elevated calcium  levels if it is a squamous cell carcinoma due to production of PTHrp or 1.25OH vit D. -We recommended and patient is  agreeable to be referred to Dr. Kerrin from cardiothoracic surgery for further evaluation and management of her FDG avid right middle lobe lung lesion. -We discussed that there might be several potential approaches.  Might need navigational bronchoscopy for biopsy if that is technically possible to get a tissue diagnosis and molecular profiling.  If this were primary lung cancer treatments if this were localized might involve surgical resection if technically amendable versus SBRT.  -Would appreciate Dr. Chrystal input. -We also discussed that sometimes solid tumors like lung cancer can cause a reactive increase in plasma cells and so that is a possibility as well. -There were some incidental findings on the PET scan such as a moderate to large hiatal hernia, diffuse thyroid  uptake possibly from her Hashimoto's, small indeterminate renal lesion likely simple cyst, gallstones and fibroid uterus. . No orders of the defined types were placed in this encounter.   FOLLOW-UP: CT bone marrow aspiration and biopsy in 1 week Referral to Dr. Kerrin from CT surgery for evaluation of right middle lobe FDG avid lesion concerning for primary lung cancer. Return to clinic with Dr. Onesimo in 2 to 3 weeks   .The total time spent in the appointment was 30 minutes* .  All of the patient's questions were answered with apparent satisfaction. The patient knows to call the clinic with any problems, questions or concerns.   Emaline Onesimo MD MS AAHIVMS [facility] Hematology/Oncology Physician [facility]  .*Total Encounter Time as defined by the Centers for Medicare and Medicaid Services includes, in addition to the face-to-face time of a patient visit (documented in the note above) non-face-to-face time: obtaining and reviewing outside history, ordering and reviewing medications, tests or procedures, care coordination (communications with other health care professionals or caregivers) and  documentation in the medical record.

## 2023-09-12 DIAGNOSIS — H903 Sensorineural hearing loss, bilateral: Secondary | ICD-10-CM | POA: Diagnosis not present

## 2023-09-20 ENCOUNTER — Other Ambulatory Visit (HOSPITAL_COMMUNITY): Payer: Self-pay | Admitting: Radiology

## 2023-09-20 DIAGNOSIS — D472 Monoclonal gammopathy: Secondary | ICD-10-CM

## 2023-09-20 NOTE — H&P (Signed)
Chief Complaint: Patient was seen in consultation today for monoclonal paraproteinemia ; referred for bone marrow biopsy for further evaluation   Referring Physician(s): Johney Maine  Supervising Physician: Simonne Come  Patient Status: [facility] - Out-pt  History of Present Illness: Audrey [name] is a 78 y.o. female with history of arthritis, GERD, hearing impairment, hypothyroidism, osteopenia, basal cell cancer on the the face-removed, and uterine prolapse. Patient was referred to Johney Maine, MD by her PCP due to low monoclonal protein on UPEP and hypercalcemia. Patient's initial consult was 08/12/23. At her initial visit, patient reported c/o generalized aching. At the patient's follow up 09/11/23, labwork was reviewed and reported clonal light chain excess. PET scan 08/25/23 reported "hypermetabolic right middle lobe nodule, indicative of primary bronchogenic carcinoma." Patient was referred for a bone marrow biopsy for further evaluation.    Past Medical History:  Diagnosis Date   Arthritis    Cancer (HCC)    basal cell cancer on face removed; Dr Yetta Barre   Fracture    right 4th and 5th metacarpal   GERD (gastroesophageal reflux disease)    Hearing aid worn    B/L   HOH (hard of hearing)    Hyperlipidemia    Hypothyroidism    Osteopenia after menopause    Pneumonia    Uterine prolapse    Wears glasses     Past Surgical History:  Procedure Laterality Date   CATARACT EXTRACTION Right    COLONOSCOPY  09/05/2010   neg; [facility]   HEMORRHOID SURGERY     OPEN REDUCTION INTERNAL FIXATION (ORIF) METACARPAL Right 04/10/2020   Procedure: OPEN REDUCTION INTERNAL FIXATION (ORIF)  AS NECESSARY RIGHT FOURTH AND FIFTH METACARPAL FRACTURES;  Surgeon: Dominica Severin, MD;  Location: [facility] OR;  Service: Orthopedics;  Laterality: Right;  OPEN REDUCTION INTERNAL FIXATION (ORIF)  AS NECESSARY RIGHT FOURTH AND FIFTH METACARPAL FRACTURES    Allergies: Celecoxib and  Sulfonamide derivatives  Medications: Prior to Admission medications   Medication Sig Start Date End Date Taking? Authorizing Provider  atorvastatin (LIPITOR) 20 MG tablet Take 1 tablet by mouth daily. Patient taking differently: Take 20 mg by mouth daily. 10/04/18   Pincus Sanes, MD  Cholecalciferol (VITAMIN D) 50 MCG (2000 UT) tablet Take 2,000 Units by mouth daily.    [provider]  FLUoxetine (PROZAC) 40 MG capsule TAKE 1 CAPSULE BY MOUTH EVERY DAY Patient taking differently: Take 40 mg by mouth daily. 10/04/18   Pincus Sanes, MD  gatifloxacin (ZYMAXID) 0.5 % SOLN Place 1 drop into the right eye 3 (three) times daily. 12/28/21   [provider]  ketorolac (ACULAR) 0.5 % ophthalmic solution SMARTSIG:In Eye(s) 12/28/21   [provider]  levothyroxine (SYNTHROID, LEVOTHROID) 25 MCG tablet TAKE 1 TABLET BY MOUTH BEFORE BREAKFAST Patient taking differently: Take 25 mcg by mouth daily before breakfast. 10/04/18   Pincus Sanes, MD  Multiple Vitamins-Minerals (PRESERVISION AREDS) CAPS Take 1 capsule by mouth daily. Patient not taking: Reported on 02/03/2022    [provider]  naproxen sodium (ALEVE) 220 MG tablet Take 220 mg by mouth daily as needed (pain).    [provider]  nystatin-triamcinolone (MYCOLOG II) cream Apply 1 application topically daily as needed (Vaginal itcing).     [provider]  pantoprazole (PROTONIX) 40 MG tablet Take 40 mg by mouth daily. 12/05/21   [provider]  prednisoLONE acetate (PRED FORTE) 1 % ophthalmic suspension Place 1 drop into the right eye 4 (four)  times daily. 12/28/21   [provider]  PREVIDENT 5000 DRY MOUTH 1.1 % GEL dental gel Take by mouth. Patient not taking: Reported on 02/03/2022 11/11/21   [provider]  SYSTANE ULTRA 0.4-0.3 % SOLN SMARTSIG:1 Drop(s) In Eye(s) PRN 12/28/21   [provider]  timolol (TIMOPTIC) 0.5 % ophthalmic solution SMARTSIG:In Eye(s)  01/18/22   [provider]     Family History  Problem Relation Age of Onset   Heart attack Mother        in 51s   Uterine cancer Mother    Heart attack Father        in 22s   Coronary artery disease Brother        stent in 64s   Other Brother        ? leukemia   Stroke Maternal Grandmother    Heart attack Maternal Grandfather        in 67s   Diabetes Neg Hx    Colon cancer Neg Hx    Esophageal cancer Neg Hx    Rectal cancer Neg Hx    Stomach cancer Neg Hx     Social History   Socioeconomic History   Marital status: Married    Spouse name: Not on file   Number of children: 2   Years of education: Not on file   Highest education level: Not on file  Occupational History   Not on file  Tobacco Use   Smoking status: Never   Smokeless tobacco: Never  Vaping Use   Vaping status: Never Used  Substance and Sexual Activity   Alcohol use: No    Alcohol/week: 0.0 standard drinks of alcohol   Drug use: No   Sexual activity: Not on file  Other Topics Concern   Not on file  Social History Narrative   Lives with spouse;   One son in [place]   Dtr in [place]   2 grand children   2 twin girl; in [place] in July (11)   Social Drivers of Health   Financial Resource Strain: Low Risk  (07/12/2018)   Overall Financial Resource Strain (CARDIA)    Difficulty of Paying Living Expenses: Not hard at all  Food Insecurity: No Food Insecurity (07/12/2018)   Hunger Vital Sign    Worried About Running Out of Food in the Last Year: Never true    Ran Out of Food in the Last Year: Never true  Transportation Needs: No Transportation Needs (07/12/2018)   PRAPARE - Administrator, Civil Service (Medical): No    Lack of Transportation (Non-Medical): No  Physical Activity: Inactive (07/12/2018)   Exercise Vital Sign    Days of Exercise per Week: 0 days    Minutes of Exercise per Session: 0 min  Stress: No Stress Concern Present (07/12/2018)   [employer] of  Occupational Health - Occupational Stress Questionnaire    Feeling of Stress : Not at all  Social Connections: Socially Integrated (07/12/2018)   Social Connection and Isolation Panel [NHANES]    Frequency of Communication with Friends and Family: More than three times a week    Frequency of Social Gatherings with Friends and Family: More than three times a week    Attends Religious Services: More than 4 times per year    Active Member of [employer] or Organizations: Yes    Attends Engineer, structural: More than 4 times per year    Marital Status: Married  Review of Systems; denies fever,HA,CP,dyspnea, cough, abd pain, N/V or bleeding ; she does have some chronic back pain  Vital Signs: Vitals:   09/22/23 0709  BP: 118/61  Resp: 16  Temp: 98.4 F (36.9 C)   Code Status: FULL CODE  Advance Care Plan: no documents on file    Physical Exam awake/alert; chest- CTA bilat; heart- RRR; abd-soft,+BS,NT; no LE edema  Imaging: NM PET Image Initial (PI) Whole Body Result Date: 09/08/2023 CLINICAL DATA:  Initial treatment strategy for multiple myeloma. EXAM: NUCLEAR MEDICINE PET WHOLE BODY TECHNIQUE: 7.8 mCi F-18 FDG was injected intravenously. Full-ring PET imaging was performed from the head to foot after the radiotracer. CT data was obtained and used for attenuation correction and anatomic localization. Fasting blood glucose:  mg/dl COMPARISON:  CT chest 16/06/9603. FINDINGS: Mediastinal blood pool activity: SUV max 2.9 HEAD/NECK: No abnormal hypermetabolism. Incidental CT findings: None. CHEST: Hypermetabolic perihilar right middle lobe nodule measures 2.4 x 2.8 cm (7/32), SUV max 8.4. Uniform thyroid hypermetabolism. No additional abnormal hypermetabolism. Incidental CT findings: Atherosclerotic calcification of the aorta. Heart is enlarged. No pericardial or pleural effusion. ABDOMEN/PELVIS: No abnormal hypermetabolism. Incidental CT findings: Gallstones. Small low-attenuation  lesion in the right kidney. Moderate to large hiatal hernia. Probable fibroid uterus. SKELETON: No abnormal hypermetabolism. Incidental CT findings: Degenerative changes in the spine. EXTREMITIES: No abnormal hypermetabolism. Incidental CT findings: None. IMPRESSION: 1. No evidence of FDG avid multiple myeloma. 2. Hypermetabolic right middle lobe nodule, indicative of primary bronchogenic carcinoma. These results will be called to the ordering clinician or representative by the Radiologist Assistant, and communication documented in the PACS or [employer]. 3. Uniform thyroid hypermetabolism. Consider laboratory correlation. 4. Cholelithiasis. 5. Moderate to large hiatal hernia. 6. Fibroid uterus. 7.  Aortic atherosclerosis (ICD10-I70.0). Electronically Signed   By: Leanna Battles M.D.   On: 09/08/2023 14:48    Labs:  CBC: Recent Labs    08/12/23 1202  WBC 5.4  HGB 12.7  HCT 38.4  PLT 229    COAGS: No results for input(s): "INR", "APTT" in the last 8760 hours.  BMP: Recent Labs    08/12/23 1202  NA 140  K 4.1  CL 108  CO2 26  GLUCOSE 89  BUN 18  CALCIUM 10.5*  CREATININE 0.69  GFRNONAA >60    LIVER FUNCTION TESTS: Recent Labs    08/12/23 1202  BILITOT 0.4  AST 21  ALT 20  ALKPHOS 76  PROT 6.7  ALBUMIN 4.2    TUMOR MARKERS: No results for input(s): "AFPTM", "CEA", "CA199", "CHROMGRNA" in the last 8760 hours.  Assessment and Plan:  Audrey [name] is a 78 y.o. female with history of arthritis, GERD, hearing impairment, hypothyroidism, osteopenia, basal cell cancer on the the face-removed, and uterine prolapse. Patient was referred to Johney Maine, MD by her PCP due to low monoclonal protein on UPEP and hypercalcemia. Patient's initial consult was 08/12/23. At her initial visit, patient reported c/o generalized aching. At the patient's follow up 09/11/23, labwork was reviewed and reported clonal light chain excess. PET scan 08/25/23 reported "hypermetabolic  right middle lobe nodule, indicative of primary bronchogenic carcinoma." Patient was referred for a bone marrow biopsy for further evaluation. Risks and benefits of procedure was discussed with the patient including, but not limited to bleeding, infection, damage to adjacent structures or low yield requiring additional tests.  All of the questions were answered and there is agreement to proceed.  Consent signed and in chart.   Thank you  for this interesting consult.  I greatly enjoyed meeting Audrey [name] and look forward to participating in their care.  A copy of this report was sent to the requesting provider on this date.  Electronically Signed: Rosalita Levan, PA/Kevin Arlesia Kiel,PA-C 09/20/2023, 1:21 PM   I spent a total of  15 Minutes   in face to face in clinical consultation, greater than 50% of which was counseling/coordinating care for monoclonal paraproteinemia/CT guided bone marrow biopsy

## 2023-09-21 ENCOUNTER — Other Ambulatory Visit: Payer: Self-pay | Admitting: Radiology

## 2023-09-22 ENCOUNTER — Encounter (HOSPITAL_COMMUNITY): Payer: Self-pay

## 2023-09-22 ENCOUNTER — Ambulatory Visit (HOSPITAL_COMMUNITY)
Admission: RE | Admit: 2023-09-22 | Discharge: 2023-09-22 | Disposition: A | Discharge status: Home / Self Care | Payer: Medicare PPO | Source: Ambulatory Visit | Attending: Hematology | Admitting: Hematology

## 2023-09-22 ENCOUNTER — Ambulatory Visit (HOSPITAL_COMMUNITY)
Admission: RE | Admit: 2023-09-22 | Discharge: 2023-09-22 | Disposition: A | Discharge status: Home / Self Care | Payer: Medicare PPO | Source: Ambulatory Visit | Attending: Hematology

## 2023-09-22 ENCOUNTER — Other Ambulatory Visit: Payer: Self-pay

## 2023-09-22 DIAGNOSIS — D472 Monoclonal gammopathy: Secondary | ICD-10-CM | POA: Diagnosis not present

## 2023-09-22 DIAGNOSIS — M8580 Other specified disorders of bone density and structure, unspecified site: Secondary | ICD-10-CM | POA: Insufficient documentation

## 2023-09-22 DIAGNOSIS — Z85828 Personal history of other malignant neoplasm of skin: Secondary | ICD-10-CM | POA: Insufficient documentation

## 2023-09-22 DIAGNOSIS — N814 Uterovaginal prolapse, unspecified: Secondary | ICD-10-CM | POA: Diagnosis not present

## 2023-09-22 DIAGNOSIS — E039 Hypothyroidism, unspecified: Secondary | ICD-10-CM | POA: Insufficient documentation

## 2023-09-22 DIAGNOSIS — R911 Solitary pulmonary nodule: Secondary | ICD-10-CM | POA: Diagnosis not present

## 2023-09-22 DIAGNOSIS — M1990 Unspecified osteoarthritis, unspecified site: Secondary | ICD-10-CM | POA: Insufficient documentation

## 2023-09-22 DIAGNOSIS — K219 Gastro-esophageal reflux disease without esophagitis: Secondary | ICD-10-CM | POA: Diagnosis not present

## 2023-09-22 DIAGNOSIS — H9190 Unspecified hearing loss, unspecified ear: Secondary | ICD-10-CM | POA: Insufficient documentation

## 2023-09-22 DIAGNOSIS — Z1379 Encounter for other screening for genetic and chromosomal anomalies: Secondary | ICD-10-CM | POA: Insufficient documentation

## 2023-09-22 LAB — CBC WITH DIFFERENTIAL/PLATELET
Abs Immature Granulocytes: 0.02 10*3/uL (ref 0.00–0.07)
Basophils Absolute: 0 10*3/uL (ref 0.0–0.1)
Basophils Relative: 1 %
Eosinophils Absolute: 0.4 10*3/uL (ref 0.0–0.5)
Eosinophils Relative: 6 %
HCT: 38.9 % (ref 36.0–46.0)
Hemoglobin: 13.2 g/dL (ref 12.0–15.0)
Immature Granulocytes: 0 %
Lymphocytes Relative: 26 %
Lymphs Abs: 1.5 10*3/uL (ref 0.7–4.0)
MCH: 32.3 pg (ref 26.0–34.0)
MCHC: 33.9 g/dL (ref 30.0–36.0)
MCV: 95.1 fL (ref 80.0–100.0)
Monocytes Absolute: 0.6 10*3/uL (ref 0.1–1.0)
Monocytes Relative: 9 %
Neutro Abs: 3.4 10*3/uL (ref 1.7–7.7)
Neutrophils Relative %: 58 %
Platelets: 248 10*3/uL (ref 150–400)
RBC: 4.09 MIL/uL (ref 3.87–5.11)
RDW: 13.6 % (ref 11.5–15.5)
WBC: 5.9 10*3/uL (ref 4.0–10.5)
nRBC: 0 % (ref 0.0–0.2)

## 2023-09-22 LAB — PROTIME-INR
INR: 1 (ref 0.8–1.2)
Prothrombin Time: 13.7 s (ref 11.4–15.2)

## 2023-09-22 MED ORDER — FENTANYL CITRATE (PF) 100 MCG/2ML IJ SOLN
INTRAMUSCULAR | Status: AC
  Filled 2023-09-22: qty 2

## 2023-09-22 MED ORDER — MIDAZOLAM HCL 2 MG/2ML IJ SOLN
INTRAMUSCULAR | Status: AC
  Filled 2023-09-22: qty 2

## 2023-09-22 MED ORDER — SODIUM CHLORIDE 0.9 % IV SOLN: INTRAVENOUS | Status: DC

## 2023-09-22 MED ORDER — FENTANYL CITRATE (PF) 100 MCG/2ML IJ SOLN
INTRAMUSCULAR | Status: AC | PRN
Start: 2023-09-22 — End: 2023-09-22
  Administered 2023-09-22: 50 ug via INTRAVENOUS

## 2023-09-22 MED ORDER — MIDAZOLAM HCL 2 MG/2ML IJ SOLN
INTRAMUSCULAR | Status: AC | PRN
  Administered 2023-09-22: 1 mg via INTRAVENOUS

## 2023-09-22 NOTE — Procedures (Signed)
Pre-procedure Diagnosis: Monoclonal paraproteinemia  Post-procedure Diagnosis: Same  Technically successful CT guided bone marrow aspiration and biopsy of left iliac crest.   Complications: None Immediate  EBL: None  Signed: Simonne Come Pager: [phone] 09/22/2023, 9:39 AM

## 2023-09-22 NOTE — Discharge Instructions (Signed)
Please call Interventional Radiology clinic [phone] with any questions or concerns. ? ?You may remove your dressing and shower tomorrow. ? ? ?Bone Marrow Aspiration and Bone Marrow Biopsy, Adult, Care After ?This sheet gives you information about how to care for yourself after your procedure. Your health care provider may also give you more specific instructions. If you have problems or questions, contact your health care provider. ?What can I expect after the procedure? ?After the procedure, it is common to have: ?Mild pain and tenderness. ?Swelling. ?Bruising. ?Follow these instructions at home: ?Puncture site care ?Follow instructions from your health care provider about how to take care of the puncture site. Make sure you: ?Wash your hands with soap and water before and after you change your bandage (dressing). If soap and water are not available, use hand sanitizer. ?Change your dressing as told by your health care provider. ?Check your puncture site every day for signs of infection. Check for: ?More redness, swelling, or pain. ?Fluid or blood. ?Warmth. ?Pus or a bad smell.   ?Activity ?Return to your normal activities as told by your health care provider. Ask your health care provider what activities are safe for you. ?Do not lift anything that is heavier than 10 lb (4.5 kg), or the limit that you are told, until your health care provider says that it is safe. ?Do not drive for 24 hours if you were given a sedative during your procedure. ?General instructions ?Take over-the-counter and prescription medicines only as told by your health care provider. ?Do not take baths, swim, or use a hot tub until your health care provider approves. Ask your health care provider if you may take showers. You may only be allowed to take sponge baths. ?If directed, put ice on the affected area. To do this: ?Put ice in a plastic bag. ?Place a towel between your skin and the bag. ?Leave the ice on for 20 minutes, 2-3 times a  day. ?Keep all follow-up visits as told by your health care provider. This is important.   ?Contact a health care provider if: ?Your pain is not controlled with medicine. ?You have a fever. ?You have more redness, swelling, or pain around the puncture site. ?You have fluid or blood coming from the puncture site. ?Your puncture site feels warm to the touch. ?You have pus or a bad smell coming from the puncture site. ?Summary ?After the procedure, it is common to have mild pain, tenderness, swelling, and bruising. ?Follow instructions from your health care provider about how to take care of the puncture site and what activities are safe for you. ?Take over-the-counter and prescription medicines only as told by your health care provider. ?Contact a health care provider if you have any signs of infection, such as fluid or blood coming from the puncture site. ?This information is not intended to replace advice given to you by your health care provider. Make sure you discuss any questions you have with your health care provider. ?Document Revised: 01/08/2019 Document Reviewed: 01/08/2019 ?Elsevier Patient Education ? 2021 Elsevier Inc. ? ? ?Moderate Conscious Sedation, Adult, Care After ?This sheet gives you information about how to care for yourself after your procedure. Your health care provider may also give you more specific instructions. If you have problems or questions, contact your health care provider. ?What can I expect after the procedure? ?After the procedure, it is common to have: ?Sleepiness for several hours. ?Impaired judgment for several hours. ?Difficulty with balance. ?Vomiting if you eat too   soon. ?Follow these instructions at home: ?For the time period you were told by your health care provider: ?Rest. ?Do not participate in activities where you could fall or become injured. ?Do not drive or use machinery. ?Do not drink alcohol. ?Do not take sleeping pills or medicines that cause drowsiness. ?Do not  make important decisions or sign legal documents. ?Do not take care of children on your own.  ?  ?  ?Eating and drinking ?Follow the diet recommended by your health care provider. ?Drink enough fluid to keep your urine pale yellow. ?If you vomit: ?Drink water, juice, or soup when you can drink without vomiting. ?Make sure you have little or no nausea before eating solid foods.   ?General instructions ?Take over-the-counter and prescription medicines only as told by your health care provider. ?Have a responsible adult stay with you for the time you are told. It is important to have someone help care for you until you are awake and alert. ?Do not smoke. ?Keep all follow-up visits as told by your health care provider. This is important. ?Contact a health care provider if: ?You are still sleepy or having trouble with balance after 24 hours. ?You feel light-headed. ?You keep feeling nauseous or you keep vomiting. ?You develop a rash. ?You have a fever. ?You have redness or swelling around the IV site. ?Get help right away if: ?You have trouble breathing. ?You have new-onset confusion at home. ?Summary ?After the procedure, it is common to feel sleepy, have impaired judgment, or feel nauseous if you eat too soon. ?Rest after you get home. Know the things you should not do after the procedure. ?Follow the diet recommended by your health care provider and drink enough fluid to keep your urine pale yellow. ?Get help right away if you have trouble breathing or new-onset confusion at home. ?This information is not intended to replace advice given to you by your health care provider. Make sure you discuss any questions you have with your health care provider. ?Document Revised: 12/20/2019 Document Reviewed: 07/18/2019 ?Elsevier Patient Education ? 2021 Elsevier Inc.  ?

## 2023-09-25 ENCOUNTER — Ambulatory Visit: Payer: Medicare PPO | Admitting: Hematology

## 2023-09-26 ENCOUNTER — Encounter: Payer: Medicare PPO | Admitting: Thoracic Surgery (Cardiothoracic Vascular Surgery)

## 2023-09-26 LAB — SURGICAL PATHOLOGY

## 2023-10-02 ENCOUNTER — Other Ambulatory Visit: Payer: Self-pay

## 2023-10-02 ENCOUNTER — Inpatient Hospital Stay (HOSPITAL_BASED_OUTPATIENT_CLINIC_OR_DEPARTMENT_OTHER): Payer: Medicare PPO | Admitting: Hematology

## 2023-10-02 VITALS — BP 140/65 | HR 72 | Temp 98.1°F | Resp 18 | Ht 62.0 in | Wt 156.2 lb

## 2023-10-02 DIAGNOSIS — D472 Monoclonal gammopathy: Secondary | ICD-10-CM | POA: Diagnosis not present

## 2023-10-02 DIAGNOSIS — R918 Other nonspecific abnormal finding of lung field: Secondary | ICD-10-CM | POA: Diagnosis not present

## 2023-10-02 DIAGNOSIS — D7589 Other specified diseases of blood and blood-forming organs: Secondary | ICD-10-CM | POA: Diagnosis not present

## 2023-10-02 DIAGNOSIS — Z8049 Family history of malignant neoplasm of other genital organs: Secondary | ICD-10-CM | POA: Diagnosis not present

## 2023-10-02 NOTE — Progress Notes (Signed)
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  HISTORY OF PRESENTING ILLNESS:   Audrey [name] is a wonderful 78 y.o. female who has been referred to [place] by Georgann Housekeeper, MD for evaluation and management of M spike on UPEP and hypercalcemia.  Today she is accompanied by her husband and daughter-in-law, who help provide history. Patient reports that her PCP is Dr. Katrinka Blazing. Patient reports that her calcium has been elevated over the last couple of months.   She denies any lumps/bumps, fever, chills, night sweats, unexplained weight loss, or night sweats. Patient reports normal eating habits. Patient was noted to be hard of hearing.   She complains of generalized aching after activity present over the last couple of years. If she rests, her symptoms resolve. She denies any  localized discomfort to a particular area. She denies any spine pain with palpation.  Patient has had falls in the past and has had compression fractures in the spine with pain in those areas. No lytics lesions were found on imaging. Patient reports that she last received imaging of the back 6-12 months ago.   She reports that she cannot lay on her right side as pressure to her hip causes pain. This symptom has been present  for a while and is stable.   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 Patient denies any major inctions such as COVID-19 or flu.  Patient has no known kidney disease or autoimmune conditions such as RA or lupus. Her daughter-in-law r(Dr Alicia Seib) reports that patient's son has Hoshimoto's thyroiditis and it is possible that patient may also have it. Patient does take thyroid medication.   Patient  reports that she was recently diagnosed with Osteoporosis and has not yet started treatment for it. Her daughter-in-law reports that patient is currently in the prior authorization stage for Prolia. Patient did use Fosamax a while ago.   She complains of brain fog, balance issues, and mild dizziness. Patient has had balance issues for several years. She has tried  balance physical therapy in the past.   Patient reports that she is constantly cold.   She reports that she is currently in the beginning stages of macular degeneration.   She generally does not drink any water and does drink caffeine regularly.   INTERVAL HISTORY:  Audrey [name] is a wonderful 78 y.o. female who is here for continued evaluation and management of M spike on UPEP and hypercalcemia.  I last connected with the patient on 09/11/2023 and she was doing well overall.   Patient is accompanied by her husband and her daughter during this visit. Patient notes she has been doing well overall since our last visit. She does complain of bone pain, back and joint pain. Patient notes she had bone density study in the past, which showed osteoporosis.   She denies any new infection issues, fever, chills, night sweats, unexpected weight loss, abdominal pain, chest pain, or leg swelling.  Patient has an upcoming appointment with Dr. Dorris Fetch on 10/04/2023 for evaluation of her lug nodule.  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; Dr Yetta Barre  Fracture    right 4th and 5th metacarpal   GERD (gastroesophageal reflux disease)    Hearing aid worn    B/L   HOH (hard of hearing)    Hyperlipidemia    Hypothyroidism    Osteopenia after menopause    Pneumonia    Uterine prolapse    Wears glasses     SURGICAL HISTORY: Past Surgical History:  Procedure Laterality Date   CATARACT EXTRACTION Right    COLONOSCOPY  09/05/2010   neg; [facility]    HEMORRHOID SURGERY     OPEN REDUCTION INTERNAL FIXATION (ORIF) METACARPAL Right 04/10/2020   Procedure: OPEN REDUCTION INTERNAL FIXATION (ORIF)  AS NECESSARY RIGHT FOURTH AND FIFTH METACARPAL FRACTURES;  Surgeon: Dominica Severin, MD;  Location: [facility] OR;  Service: Orthopedics;  Laterality: Right;  OPEN REDUCTION INTERNAL FIXATION (ORIF)  AS NECESSARY RIGHT FOURTH AND FIFTH METACARPAL FRACTURES    SOCIAL HISTORY: Social History   Socioeconomic History   Marital status: Married    Spouse name: Not on file   Number of children: 2   Years of education: Not on file   Highest education level: Not on file  Occupational History   Not on file  Tobacco Use   Smoking status: Never   Smokeless tobacco: Never  Vaping Use   Vaping status: Never Used  Substance and Sexual Activity   Alcohol use: No    Alcohol/week: 0.0 standard drinks of alcohol   Drug use: No   Sexual activity: Not on file  Other Topics Concern   Not on file  Social History Narrative   Lives with spouse;   One son in [place]   Dtr in [place]   2 grand children   2 twin girl; in [place] in July (11)   Social Drivers of Health   Financial Resource Strain: Low Risk  (07/12/2018)   Overall Financial Resource Strain (CARDIA)    Difficulty of Paying Living Expenses: Not hard at all  Food Insecurity: No Food Insecurity (07/12/2018)   Hunger Vital Sign    Worried About Running Out of Food in the Last Year: Never true    Ran Out of Food in the Last Year: Never true  Transportation Needs: No Transportation Needs (07/12/2018)   PRAPARE - Administrator, Civil Service (Medical): No    Lack of Transportation (Non-Medical): No  Physical Activity: Inactive (07/12/2018)   Exercise Vital Sign    Days of Exercise per Week: 0 days    Minutes of Exercise per Session: 0 min  Stress: No Stress Concern Present (07/12/2018)   [employer] of Occupational Health - Occupational Stress Questionnaire    Feeling of Stress : Not  at all  Social Connections: Socially Integrated (07/12/2018)   Social Connection and Isolation Panel [NHANES]    Frequency of Communication with Friends and Family: More than three times a week    Frequency of Social Gatherings with Friends and Family: More than three times a week    Attends Religious Services: More than 4 times per year    Active Member of [employer] or Organizations: Yes    Attends Banker Meetings: More than 4 times per year    Marital Status: Married  Catering manager Violence: Not At Risk (07/12/2018)   Humiliation, Afraid, Rape, and Kick questionnaire    Fear of Current or Ex-Partner: No    Emotionally Abused: No    Physically Abused: No    Sexually Abused: No    FAMILY  HISTORY: Family History  Problem Relation Age of Onset   Heart attack Mother        in 45s   Uterine cancer Mother    Heart attack Father        in 64s   Coronary artery disease Brother        stent in 35s   Other Brother        ? leukemia   Stroke Maternal Grandmother    Heart attack Maternal Grandfather        in 56s   Diabetes Neg Hx    Colon cancer Neg Hx    Esophageal cancer Neg Hx    Rectal cancer Neg Hx    Stomach cancer Neg Hx     ALLERGIES:  is allergic to celecoxib and sulfonamide derivatives.  MEDICATIONS:  Current Outpatient Medications  Medication Sig Dispense Refill   atorvastatin (LIPITOR) 20 MG tablet Take 1 tablet by mouth daily. (Patient taking differently: Take 20 mg by mouth daily.) 90 tablet 3   Cholecalciferol (VITAMIN D) 50 MCG (2000 UT) tablet Take 2,000 Units by mouth daily.     FLUoxetine (PROZAC) 40 MG capsule TAKE 1 CAPSULE BY MOUTH EVERY DAY (Patient taking differently: Take 40 mg by mouth daily.) 90 capsule 3   gatifloxacin (ZYMAXID) 0.5 % SOLN Place 1 drop into the right eye 3 (three) times daily.     ketorolac (ACULAR) 0.5 % ophthalmic solution SMARTSIG:In Eye(s)     levothyroxine (SYNTHROID, LEVOTHROID) 25 MCG tablet TAKE 1 TABLET BY MOUTH  BEFORE BREAKFAST (Patient taking differently: Take 25 mcg by mouth daily before breakfast.) 90 tablet 3   Multiple Vitamins-Minerals (PRESERVISION AREDS) CAPS Take 1 capsule by mouth daily. (Patient not taking: Reported on 02/03/2022)     naproxen sodium (ALEVE) 220 MG tablet Take 220 mg by mouth daily as needed (pain).     nystatin-triamcinolone (MYCOLOG II) cream Apply 1 application topically daily as needed (Vaginal itcing).      pantoprazole (PROTONIX) 40 MG tablet Take 40 mg by mouth daily.     prednisoLONE acetate (PRED FORTE) 1 % ophthalmic suspension Place 1 drop into the right eye 4 (four) times daily.     PREVIDENT 5000 DRY MOUTH 1.1 % GEL dental gel Take by mouth. (Patient not taking: Reported on 02/03/2022)     SYSTANE ULTRA 0.4-0.3 % SOLN SMARTSIG:1 Drop(s) In Eye(s) PRN     timolol (TIMOPTIC) 0.5 % ophthalmic solution SMARTSIG:In Eye(s)     No current facility-administered medications for this visit.    REVIEW OF SYSTEMS:    negative except as noted above.  PHYSICAL EXAMINATION:  .BP (!) 140/65 (BP Location: Left Arm, Patient Position: Sitting)   Pulse 72   Temp 98.1 F (36.7 C) (Tympanic)   Resp 18   Ht 5\' 2"  (1.575 m)   Wt 156 lb 3.2 oz (70.9 kg)   SpO2 98%   BMI 28.57 kg/m   GENERAL:alert, in no acute distress and comfortable SKIN: no acute rashes, no significant lesions EYES: conjunctiva are pink and non-injected, sclera anicteric OROPHARYNX: MMM, no exudates, no oropharyngeal erythema or ulceration NECK: supple, no JVD LYMPH:  no palpable lymphadenopathy in the cervical, axillary or inguinal regions LUNGS: clear to auscultation b/l with normal respiratory effort HEART: regular rate & rhythm ABDOMEN:  normoactive bowel sounds , non tender, not distended. Extremity: no pedal edema PSYCH: alert & oriented x 3 with fluent speech NEURO: no focal motor/sensory deficits  LABORATORY DATA:  I have  reviewed the data as  listed                .            Latest Ref Rng & Units 09/22/2023    7:10 AM 08/12/2023   12:02 PM 04/10/2020    6:28 AM  CBC  WBC 4.0 - 10.5 K/uL 5.9  5.4  4.9   Hemoglobin 12.0 - 15.0 g/dL 41.3  24.4  01.0   Hematocrit 36.0 - 46.0 % 38.9  38.4  36.1   Platelets 150 - 400 K/uL 248  229  189     .    Latest Ref Rng & Units 08/12/2023   12:02 PM 10/04/2018    8:26 AM 07/11/2017   10:10 AM  CMP  Glucose 70 - 99 mg/dL 89  88  96   BUN 8 - 23 mg/dL 18  16  17    Creatinine 0.44 - 1.00 mg/dL 2.72  5.36  6.44   Sodium 135 - 145 mmol/L 140  141  139   Potassium 3.5 - 5.1 mmol/L 4.1  4.4  4.5   Chloride 98 - 111 mmol/L 108  105  104   CO2 22 - 32 mmol/L 26  28  28    Calcium 8.9 - 10.3 mg/dL 03.4  74.2  59.5   Total Protein 6.5 - 8.1 g/dL 6.7  6.6  7.1   Total Bilirubin <1.2 mg/dL 0.4  0.5  0.5   Alkaline Phos 38 - 126 U/L 76  65  63   AST 15 - 41 U/L 21  15  14    ALT 0 - 44 U/L 20  15  12     Component     Latest Ref Rng 08/12/2023 08/16/2023  Total Protein, Urine-UPE24     Not Estab. mg/dL  8.5   Total Protein, Urine-Ur/day     30 - 150 mg/24 hr  106   ALBUMIN, U     %  23.3   ALPHA 1 URINE     %  6.7   Alpha 2, Urine     %  19.4   % BETA, Urine     %  21.6   GAMMA GLOBULIN URINE     %  28.9   Free Kappa Lt Chains,Ur     1.17 - 86.46 mg/L  146.10 (H)   Free Lambda Lt Chains,Ur     0.27 - 15.21 mg/L  6.36 (C)  Free Kappa/Lambda Ratio     1.83 - 14.26   22.97 (H) (C)  Immunofixation Result, Urine  Comment ! (C)  Total Volume  1,250   M-SPIKE %, Urine     Not Observed %  14.3 (H) (C)  M-Spike, mg/24 hr     Not Observed mg/24 hr  15 (H) (C)  NOTE:  Comment (C)  IgG (Immunoglobin G), Serum     586 - 1,602 mg/dL 638    IgA     64 - 756 mg/dL 95    IgM (Immunoglobulin M), Srm     26 - 217 mg/dL 70    Total Protein ELP     6.0 - 8.5 g/dL 6.4 (C)   Albumin SerPl Elph-Mcnc     2.9 - 4.4 g/dL 3.6 (C)   Alpha 1     0.0 - 0.4 g/dL 0.2 (C)    Alpha2 Glob SerPl Elph-Mcnc     0.4 - 1.0 g/dL 0.7 (C)   B-Globulin  SerPl Elph-Mcnc     0.7 - 1.3 g/dL 1.0 (C)   Gamma Glob SerPl Elph-Mcnc     0.4 - 1.8 g/dL 0.8 (C)   M Protein SerPl Elph-Mcnc     Not Observed g/dL Not Observed (C)   Globulin, Total     2.2 - 3.9 g/dL 2.8 (C)   Albumin/Glob SerPl     0.7 - 1.7  1.3 (C)   IFE 1 Comment (C)   Please Note (HCV): Comment (C)   Kappa free light chain     3.3 - 19.4 mg/L 113.0 (H)    Lambda free light chains     5.7 - 26.3 mg/L 13.0    Kappa, lambda light chain ratio     0.26 - 1.65  8.69 (H)    Calcium, Ionized, Serum     4.5 - 5.6 mg/dL 5.5    LDH     98 - 098 U/L 186    Sed Rate     0 - 22 mm/hr 7    Beta-2 Microglobulin     0.6 - 2.4 mg/L 1.8      Legend: (H) High ! Abnormal (C) Corrected   RADIOGRAPHIC STUDIES: I have personally reviewed the radiological images as listed and agreed with the findings in the report. CT BONE MARROW BIOPSY & ASPIRATION Result Date: 09/22/2023 INDICATION: Low monoclonal protein on UPEP. Hypercalcemia. Please perform CT-guided bone marrow biopsy for tissue diagnostic purposes. EXAM: CT-GUIDED BONE MARROW BIOPSY AND ASPIRATION MEDICATIONS: None ANESTHESIA/SEDATION: Moderate (conscious) sedation was employed during this procedure as administered by the Interventional Radiology RN. A total of Versed 1 mg and Fentanyl 50 mcg was administered intravenously. Moderate Sedation Time: 10 minutes. The patient's level of consciousness and vital signs were monitored continuously by radiology nursing throughout the procedure under my direct supervision. COMPLICATIONS: None immediate. PROCEDURE: Informed consent was obtained from the patient following an explanation of the procedure, risks, benefits and alternatives. The patient understands, agrees and consents for the procedure. All questions were addressed. A time out was performed prior to the initiation of the procedure. The patient was positioned prone and  non-contrast localization CT was performed of the pelvis to demonstrate the iliac marrow spaces. The operative site was prepped and draped in the usual sterile fashion. Under sterile conditions and local anesthesia, a 22 gauge spinal needle was utilized for procedural planning. Next, an 11 gauge coaxial bone biopsy needle was advanced into the left iliac marrow space. Needle position was confirmed with CT imaging. Initially, a bone marrow aspiration was performed. Next, a bone marrow biopsy was obtained with the 11 gauge outer bone marrow device. Samples were prepared with the cytotechnologist and deemed adequate. The needle was removed and superficial hemostasis was obtained with manual compression. A dressing was applied. The patient tolerated the procedure well without immediate post procedural complication. IMPRESSION: Successful CT guided left iliac bone marrow aspiration and core biopsy. Electronically Signed   By: Simonne Come M.D.   On: 09/22/2023 10:56    ASSESSMENT & PLAN:   78 y.o. 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   Consistent with Light chain MGUS  Myeloma FISH panel with no notable genetic mutation  2.  Hypercalcemia this has improved on repeat labs and is down to 10.5 with normal ionized calcium of 5.5. This could have been from dehydration but patient is also being worked up for plasma cell dyscrasia and also has an FDG avid right middle lobe lung  lesion which is squamous cell carcinoma could also be a factor  3.  FDG avid right middle lobe lung lesion concerning for primary bronchogenic carcinoma.  No overt lymphadenopathy noted.  PLAN: -Discussed bone marrow aspiration and biopsy results from 09/22/2023 in detail with the patient. Showed Hypocellular bone marrow (20% on a limited specimen) with kappa  predominant plasma cells which are otherwise not significantly increased and within normal immuno architecture. -discussed findings consistent with  light chain MGUS -Discussed with the patient that according to lab work-up, PET scan, and physical symptoms there is no concern for treatment. We will continue to observe the patient with lab.  -Continue to follow-up with Dr. Dorris Fetch for further evaluation and mx of FDG avid lung nodule concerning for NSCLC. -PET/CT images reviewed with patient and her family members.  -Answered all of patient's questions regarding the lung lesion and other general questions.   FOLLOW-UP: Phone visit with Dr Candise Che in 4 months Labs 2 weeks prior to phone visit  The total time spent in the appointment was 30 minutes* .  All of the patient's questions were answered with apparent satisfaction. The patient knows to call the clinic with any problems, questions or concerns.   Wyvonnia Lora MD MS AAHIVMS [facility] Hematology/Oncology Physician [facility]  .*Total Encounter Time as defined by the Centers for Medicare and Medicaid Services includes, in addition to the face-to-face time of a patient visit (documented in the note above) non-face-to-face time: obtaining and reviewing outside history, ordering and reviewing medications, tests or procedures, care coordination (communications with other health care professionals or caregivers) and documentation in the medical record.   I,Param Shah,acting as a Neurosurgeon for Wyvonnia Lora, MD.,have documented all relevant documentation on the behalf of Wyvonnia Lora, MD,as directed by  Wyvonnia Lora, MD while in the presence of Wyvonnia Lora, MD.  .I have reviewed the above documentation for accuracy and completeness, and I agree with the above. Johney Maine MD

## 2023-10-03 ENCOUNTER — Encounter (HOSPITAL_COMMUNITY): Payer: Self-pay | Admitting: Hematology

## 2023-10-04 ENCOUNTER — Encounter (HOSPITAL_COMMUNITY): Payer: Self-pay | Admitting: Hematology

## 2023-10-04 ENCOUNTER — Other Ambulatory Visit: Payer: Self-pay | Admitting: Thoracic Surgery (Cardiothoracic Vascular Surgery)

## 2023-10-04 ENCOUNTER — Encounter: Payer: Self-pay | Admitting: Thoracic Surgery (Cardiothoracic Vascular Surgery)

## 2023-10-04 ENCOUNTER — Institutional Professional Consult (permissible substitution): Payer: Medicare PPO | Admitting: Thoracic Surgery (Cardiothoracic Vascular Surgery)

## 2023-10-04 VITALS — BP 146/91 | HR 71 | Resp 18 | Ht 62.0 in | Wt 156.0 lb

## 2023-10-04 DIAGNOSIS — R911 Solitary pulmonary nodule: Secondary | ICD-10-CM

## 2023-10-04 NOTE — Progress Notes (Signed)
PCP is Merri Brunette, MD Referring Provider is Johney Maine, MD  Chief Complaint  Patient presents with   Lung Lesion    PET 12/20    HPI: [name] is sent for consultation regarding a right middle lobe mass.  She is accompanied by her husband and her daughter-in-law Audrey Hora, MD.  Audrey [name] is a 78 year old woman with a past medical history significant for hyperlipidemia, hypothyroidism, osteopenia, rib fractures, basal cell cancer of the skin, arthritis, reflux, MGUS, and a newly discovered right middle lobe mass.  She is being evaluated for hypercalcemia.  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  Bone marrow biopsy was negative for myeloma.  As part of her evaluation she had a PET.  There was no evidence of myeloma, but there was a 3 cm right middle lobe mass that was markedly hypermetabolic.  She is a lifelong non-smoker.  Her parents were both smokers but neither have lung cancer.  She is fairly active.  Her walking is somewhat limited by balance issues, but she can walk up 2 flights of stairs without stopping because of her breathing.  No chest pain, pressure, tightness, or shortness of breath.  No change in appetite or weight loss.  No unusual headaches or visual changes.  Zubrod Score: At the time of surgery this patient's most appropriate activity status/level should be described as: []     0    Normal activity, no symptoms [x]     1    Restricted in physical strenuous activity but ambulatory, able to do out light work []     2    Ambulatory and capable of self care, unable to do work activities, up and about >50 % of waking hours                              []     3    Only limited self care, in bed greater than 50% of waking hours []     4    Completely disabled, no self care, confined to bed or chair []     5    Moribund  Past Medical History:  Diagnosis Date   Arthritis    Cancer (HCC)    basal cell cancer on face removed; Dr Yetta Barre   Fracture     right 4th and 5th metacarpal   GERD (gastroesophageal reflux disease)    Hearing aid worn    B/L   HOH (hard of hearing)    Hyperlipidemia    Hypothyroidism    Osteopenia after menopause    Pneumonia    Uterine prolapse    Wears glasses     Past Surgical History:  Procedure Laterality Date   CATARACT EXTRACTION Right    COLONOSCOPY  09/05/2010   neg; [facility]   HEMORRHOID SURGERY     OPEN REDUCTION INTERNAL FIXATION (ORIF) METACARPAL Right 04/10/2020   Procedure: OPEN REDUCTION INTERNAL FIXATION (ORIF)  AS NECESSARY RIGHT FOURTH AND FIFTH METACARPAL FRACTURES;  Surgeon: Dominica Severin, MD;  Location: [facility] OR;  Service: Orthopedics;  Laterality: Right;  OPEN REDUCTION INTERNAL FIXATION (ORIF)  AS NECESSARY RIGHT FOURTH AND FIFTH METACARPAL FRACTURES    Family History  Problem Relation Age of Onset   Heart attack Mother        in 81s   Uterine cancer Mother    Heart attack Father        in 15s   Coronary artery disease Brother  stent in 12s   Other Brother        ? leukemia   Stroke Maternal Grandmother    Heart attack Maternal Grandfather        in 43s   Diabetes Neg Hx    Colon cancer Neg Hx    Esophageal cancer Neg Hx    Rectal cancer Neg Hx    Stomach cancer Neg Hx     Social History Social History   Tobacco Use   Smoking status: Never   Smokeless tobacco: Never  Vaping Use   Vaping status: Never Used  Substance Use Topics   Alcohol use: No    Alcohol/week: 0.0 standard drinks of alcohol   Drug use: No    Current Outpatient Medications  Medication Sig Dispense Refill   atorvastatin (LIPITOR) 20 MG tablet Take 1 tablet by mouth daily. (Patient taking differently: Take 20 mg by mouth daily.) 90 tablet 3   Cholecalciferol (VITAMIN D) 50 MCG (2000 UT) tablet Take 2,000 Units by mouth daily.     FLUoxetine (PROZAC) 20 MG capsule Take 20 mg by mouth daily.     levothyroxine (SYNTHROID, LEVOTHROID) 25 MCG tablet TAKE 1 TABLET BY MOUTH BEFORE BREAKFAST  (Patient taking differently: Take 25 mcg by mouth daily before breakfast.) 90 tablet 3   Multiple Vitamins-Minerals (PRESERVISION AREDS) CAPS Take 1 capsule by mouth daily.     naproxen sodium (ALEVE) 220 MG tablet Take 220 mg by mouth daily as needed (pain).     nystatin-triamcinolone (MYCOLOG II) cream Apply 1 application topically daily as needed (Vaginal itcing).      pantoprazole (PROTONIX) 40 MG tablet Take 40 mg by mouth daily.     SYSTANE ULTRA 0.4-0.3 % SOLN SMARTSIG:1 Drop(s) In Eye(s) PRN     No current facility-administered medications for this visit.    Allergies  Allergen Reactions   Celecoxib Swelling    face   Sulfonamide Derivatives Swelling    face    Review of Systems  Constitutional:  Negative for activity change, appetite change and unexpected weight change.  HENT:  Positive for hearing loss. Negative for trouble swallowing and voice change.   Respiratory:  Negative for cough, shortness of breath and wheezing.   Cardiovascular:  Negative for chest pain and leg swelling.  Genitourinary:  Negative for difficulty urinating and dysuria.  Musculoskeletal:  Positive for arthralgias, gait problem (Balance issues) and joint swelling.  Neurological:  Negative for seizures and weakness.  Hematological:  Negative for adenopathy. Does not bruise/bleed easily.    BP (!) 146/91 (BP Location: Right Arm)   Pulse 71   Resp 18   Ht 5\' 2"  (1.575 m)   Wt 156 lb (70.8 kg)   SpO2 97% Comment: RA  BMI 28.53 kg/m  Physical Exam Vitals reviewed.  Constitutional:      General: She is not in acute distress.    Appearance: Normal appearance.  HENT:     Head: Normocephalic and atraumatic.  Eyes:     General: No scleral icterus.    Extraocular Movements: Extraocular movements intact.  Neck:     Vascular: No carotid bruit.  Cardiovascular:     Rate and Rhythm: Normal rate and regular rhythm.     Heart sounds: Normal heart sounds. No murmur heard.    No friction rub. No  gallop.  Pulmonary:     Effort: Pulmonary effort is normal. No respiratory distress.     Breath sounds: Normal breath sounds. No wheezing or rales.  Abdominal:     General: There is no distension.     Palpations: Abdomen is soft.     Tenderness: There is no abdominal tenderness.  Musculoskeletal:     Cervical back: Neck supple.     Right lower leg: No edema.     Left lower leg: No edema.  Lymphadenopathy:     Cervical: No cervical adenopathy.  Skin:    General: Skin is warm and dry.  Neurological:     General: No focal deficit present.     Mental Status: She is alert and oriented to person, place, and time.     Cranial Nerves: No cranial nerve deficit.     Motor: No weakness.    Diagnostic Tests: NUCLEAR MEDICINE PET WHOLE BODY   TECHNIQUE: 7.8 mCi F-18 FDG was injected intravenously. Full-ring PET imaging was performed from the head to foot after the radiotracer. CT data was obtained and used for attenuation correction and anatomic localization.   Fasting blood glucose:  mg/dl   COMPARISON:  CT chest 04/07/2020.   FINDINGS: Mediastinal blood pool activity: SUV max 2.9   HEAD/NECK:   No abnormal hypermetabolism.   Incidental CT findings:   None.   CHEST:   Hypermetabolic perihilar right middle lobe nodule measures 2.4 x 2.8 cm (7/32), SUV max 8.4. Uniform thyroid hypermetabolism. No additional abnormal hypermetabolism.   Incidental CT findings:   Atherosclerotic calcification of the aorta. Heart is enlarged. No pericardial or pleural effusion.   ABDOMEN/PELVIS:   No abnormal hypermetabolism.   Incidental CT findings:   Gallstones. Small low-attenuation lesion in the right kidney. Moderate to large hiatal hernia. Probable fibroid uterus.   SKELETON:   No abnormal hypermetabolism.   Incidental CT findings:   Degenerative changes in the spine.   EXTREMITIES:   No abnormal hypermetabolism.   Incidental CT findings:   None.    IMPRESSION: 1. No evidence of FDG avid multiple myeloma. 2. Hypermetabolic right middle lobe nodule, indicative of primary bronchogenic carcinoma. These results will be called to the ordering clinician or representative by the Radiologist Assistant, and communication documented in the PACS or [employer]. 3. Uniform thyroid hypermetabolism. Consider laboratory correlation. 4. Cholelithiasis. 5. Moderate to large hiatal hernia. 6. Fibroid uterus. 7.  Aortic atherosclerosis (ICD10-I70.0).     Electronically Signed   By: Leanna Battles M.D.   On: 09/08/2023 14:48 I personally reviewed the PET CT images.  There is a 2.4 x 2.8 cm hypermetabolic right middle lobe mass with SUV of 8.4.  No evidence of mediastinal or hilar adenopathy.  Hiatal hernia.  Aortic atherosclerosis.  No significant coronary atherosclerosis.  Impression: Audrey [name] is a 78 year old woman with a past medical history significant for hyperlipidemia, hypothyroidism, osteopenia, rib fractures, basal cell cancer of the skin, arthritis, reflux, MGUS, and a newly discovered right middle lobe mass.  Right middle lobe lung mass-non-smoker but this is almost certainly a new primary bronchogenic carcinoma and has to be considered that unless it can be proven otherwise.  Infectious and inflammatory nodules are in the differential but are far less likely.  We discussed options of surgery versus radiation.  We discussed the relative advantages and disadvantages of each approach.  She strongly favors surgical resection.  I described the proposed operative procedure to her and her family.  I informed them of the general nature of the procedure including the need for general anesthesia, the incisions to be used, the use of the surgical robot, the use of drains to  postoperatively, the expected hospital stay, and the overall recovery.  I informed them of the indications, risks, benefits, and alternatives.  She understands the  risks include, but are not limited to death, MI, DVT, PE, bleeding, possible need for transfusion, infection, prolonged air leak, cardiac arrhythmias, as well as the possibility of other unforeseeable complications including respiratory or renal failure.  She is aware there is a small risk of need to conversion to an open procedure.  She accepts the risk and agrees to proceed.  She needs pulmonary function testing prior to surgery.  Will also do an MRI of the brain to rule out metastatic disease and complete her clinical staging.  Plan: PFTs with and without bronchodilators MR brain new lung mass rule out metastatic disease Robotic assisted right middle lobectomy on Thursday, 10/12/2023  Loreli Slot, MD [facility] [phone]

## 2023-10-05 ENCOUNTER — Encounter: Payer: Self-pay | Admitting: *Deleted

## 2023-10-05 ENCOUNTER — Other Ambulatory Visit: Payer: Self-pay | Admitting: *Deleted

## 2023-10-05 DIAGNOSIS — R911 Solitary pulmonary nodule: Secondary | ICD-10-CM

## 2023-10-09 NOTE — Pre-Procedure Instructions (Signed)
 Surgical Instructions   Your procedure is scheduled on October 12, 2023. Report to [facility] Main Entrance A at 10:00 A.M., then check in with the Admitting office. Any questions or running late day of surgery: call [phone]  Questions prior to your surgery date: call [phone], Monday-Friday, 8am-4pm. If you experience any cold or flu symptoms such as cough, fever, chills, shortness of breath, etc. between now and your scheduled surgery, please notify us  at the above number.     Remember:  Do not eat or drink after midnight the night before your surgery    Take these medicines the morning of surgery with A SIP OF WATER : atorvastatin  (LIPITOR)  FLUoxetine  (PROZAC )  levothyroxine  (SYNTHROID , LEVOTHROID)  pantoprazole  (PROTONIX )    One week prior to surgery, STOP taking any Aspirin (unless otherwise instructed by your surgeon) Aleve, Naproxen, Ibuprofen, Motrin, Advil, Goody's, BC's, all herbal medications, fish oil, and non-prescription vitamins.                     Do NOT Smoke (Tobacco/Vaping) for 24 hours prior to your procedure.  If you use a CPAP at night, you may bring your mask/headgear for your overnight stay.   You will be asked to remove any contacts, glasses, piercing's, hearing aid's, dentures/partials prior to surgery. Please bring cases for these items if needed.    Patients discharged the day of surgery will not be allowed to drive home, and someone needs to stay with them for 24 hours.  SURGICAL WAITING ROOM VISITATION Patients may have no more than 2 support people in the waiting area - these visitors may rotate.   Pre-op nurse will coordinate an appropriate time for 1 ADULT support person, who may not rotate, to accompany patient in pre-op.  Children under the age of 29 must have an adult with them who is not the patient and must remain in the main waiting area with an adult.  If the patient needs to stay at the hospital during part of their recovery,  the visitor guidelines for inpatient rooms apply.  Please refer to the [facility] website for the visitor guidelines for any additional information.   If you received a COVID test during your pre-op visit  it is requested that you wear a mask when out in public, stay away from anyone that may not be feeling well and notify your surgeon if you develop symptoms. If you have been in contact with anyone that has tested positive in the last 10 days please notify you surgeon.      Pre-operative CHG Bathing Instructions   You can play a key role in reducing the risk of infection after surgery. Your skin needs to be as free of germs as possible. You can reduce the number of germs on your skin by washing with CHG (chlorhexidine  gluconate) soap before surgery. CHG is an antiseptic soap that kills germs and continues to kill germs even after washing.   DO NOT use if you have an allergy to chlorhexidine /CHG or antibacterial soaps. If your skin becomes reddened or irritated, stop using the CHG and notify one of our RNs at [phone].              TAKE A SHOWER THE NIGHT BEFORE SURGERY AND THE DAY OF SURGERY    Please keep in mind the following:  DO NOT shave, including legs and underarms, 48 hours prior to surgery.   You may shave your face before/day of surgery.  Place  clean sheets on your bed the night before surgery Use a clean washcloth (not used since being washed) for each shower. DO NOT sleep with pet's night before surgery.  CHG Shower Instructions:  Wash your face and private area with normal soap. If you choose to wash your hair, wash first with your normal shampoo.  After you use shampoo/soap, rinse your hair and body thoroughly to remove shampoo/soap residue.  Turn the water  OFF and apply half the bottle of CHG soap to a CLEAN washcloth.  Apply CHG soap ONLY FROM YOUR NECK DOWN TO YOUR TOES (washing for 3-5 minutes)  DO NOT use CHG soap on face, private areas, open wounds, or sores.   Pay special attention to the area where your surgery is being performed.  If you are having back surgery, having someone wash your back for you may be helpful. Wait 2 minutes after CHG soap is applied, then you may rinse off the CHG soap.  Pat dry with a clean towel  Put on clean pajamas    Additional instructions for the day of surgery: DO NOT APPLY any lotions, deodorants, cologne, or perfumes.   Do not wear jewelry or makeup Do not wear nail polish, gel polish, artificial nails, or any other type of covering on natural nails (fingers and toes) Do not bring valuables to the hospital. [facility] is not responsible for valuables/personal belongings. Put on clean/comfortable clothes.  Please brush your teeth.  Ask your nurse before applying any prescription medications to the skin.

## 2023-10-10 ENCOUNTER — Encounter (HOSPITAL_COMMUNITY)
Admission: RE | Admit: 2023-10-10 | Discharge: 2023-10-10 | Disposition: A | Discharge status: Home / Self Care | Payer: Medicare PPO | Source: Ambulatory Visit | Attending: Thoracic Surgery (Cardiothoracic Vascular Surgery) | Admitting: Thoracic Surgery (Cardiothoracic Vascular Surgery)

## 2023-10-10 ENCOUNTER — Ambulatory Visit (HOSPITAL_COMMUNITY)
Admission: RE | Admit: 2023-10-10 | Discharge: 2023-10-10 | Disposition: A | Discharge status: Home / Self Care | Payer: Medicare PPO | Source: Ambulatory Visit | Attending: Thoracic Surgery (Cardiothoracic Vascular Surgery) | Admitting: Thoracic Surgery (Cardiothoracic Vascular Surgery)

## 2023-10-10 ENCOUNTER — Encounter (HOSPITAL_COMMUNITY): Payer: Self-pay

## 2023-10-10 ENCOUNTER — Other Ambulatory Visit: Payer: Self-pay

## 2023-10-10 VITALS — BP 122/55 | HR 73 | Temp 98.1°F | Resp 18 | Ht 62.0 in | Wt 157.4 lb

## 2023-10-10 DIAGNOSIS — Z01818 Encounter for other preprocedural examination: Secondary | ICD-10-CM | POA: Diagnosis not present

## 2023-10-10 DIAGNOSIS — Z823 Family history of stroke: Secondary | ICD-10-CM | POA: Diagnosis not present

## 2023-10-10 DIAGNOSIS — T8182XA Emphysema (subcutaneous) resulting from a procedure, initial encounter: Secondary | ICD-10-CM | POA: Diagnosis not present

## 2023-10-10 DIAGNOSIS — M1990 Unspecified osteoarthritis, unspecified site: Secondary | ICD-10-CM | POA: Diagnosis present

## 2023-10-10 DIAGNOSIS — F418 Other specified anxiety disorders: Secondary | ICD-10-CM | POA: Diagnosis not present

## 2023-10-10 DIAGNOSIS — Z85828 Personal history of other malignant neoplasm of skin: Secondary | ICD-10-CM | POA: Diagnosis not present

## 2023-10-10 DIAGNOSIS — R918 Other nonspecific abnormal finding of lung field: Secondary | ICD-10-CM | POA: Diagnosis present

## 2023-10-10 DIAGNOSIS — Y658 Other specified misadventures during surgical and medical care: Secondary | ICD-10-CM | POA: Diagnosis not present

## 2023-10-10 DIAGNOSIS — S2241XA Multiple fractures of ribs, right side, initial encounter for closed fracture: Secondary | ICD-10-CM | POA: Diagnosis not present

## 2023-10-10 DIAGNOSIS — J939 Pneumothorax, unspecified: Secondary | ICD-10-CM | POA: Diagnosis not present

## 2023-10-10 DIAGNOSIS — K219 Gastro-esophageal reflux disease without esophagitis: Secondary | ICD-10-CM | POA: Diagnosis present

## 2023-10-10 DIAGNOSIS — J9383 Other pneumothorax: Secondary | ICD-10-CM | POA: Diagnosis not present

## 2023-10-10 DIAGNOSIS — Z48813 Encounter for surgical aftercare following surgery on the respiratory system: Secondary | ICD-10-CM | POA: Diagnosis not present

## 2023-10-10 DIAGNOSIS — F419 Anxiety disorder, unspecified: Secondary | ICD-10-CM | POA: Diagnosis not present

## 2023-10-10 DIAGNOSIS — Z79890 Hormone replacement therapy (postmenopausal): Secondary | ICD-10-CM | POA: Diagnosis not present

## 2023-10-10 DIAGNOSIS — Z79899 Other long term (current) drug therapy: Secondary | ICD-10-CM | POA: Diagnosis not present

## 2023-10-10 DIAGNOSIS — R911 Solitary pulmonary nodule: Secondary | ICD-10-CM | POA: Diagnosis not present

## 2023-10-10 DIAGNOSIS — Z8249 Family history of ischemic heart disease and other diseases of the circulatory system: Secondary | ICD-10-CM | POA: Diagnosis not present

## 2023-10-10 DIAGNOSIS — S0502XA Injury of conjunctiva and corneal abrasion without foreign body, left eye, initial encounter: Secondary | ICD-10-CM | POA: Diagnosis not present

## 2023-10-10 DIAGNOSIS — Z888 Allergy status to other drugs, medicaments and biological substances status: Secondary | ICD-10-CM | POA: Diagnosis not present

## 2023-10-10 DIAGNOSIS — T797XXA Traumatic subcutaneous emphysema, initial encounter: Secondary | ICD-10-CM | POA: Diagnosis not present

## 2023-10-10 DIAGNOSIS — Z806 Family history of leukemia: Secondary | ICD-10-CM | POA: Diagnosis not present

## 2023-10-10 DIAGNOSIS — D7589 Other specified diseases of blood and blood-forming organs: Secondary | ICD-10-CM | POA: Diagnosis present

## 2023-10-10 DIAGNOSIS — Z8049 Family history of malignant neoplasm of other genital organs: Secondary | ICD-10-CM | POA: Diagnosis not present

## 2023-10-10 DIAGNOSIS — D472 Monoclonal gammopathy: Secondary | ICD-10-CM | POA: Diagnosis present

## 2023-10-10 DIAGNOSIS — K449 Diaphragmatic hernia without obstruction or gangrene: Secondary | ICD-10-CM | POA: Diagnosis not present

## 2023-10-10 DIAGNOSIS — E785 Hyperlipidemia, unspecified: Secondary | ICD-10-CM | POA: Diagnosis present

## 2023-10-10 DIAGNOSIS — J9811 Atelectasis: Secondary | ICD-10-CM | POA: Diagnosis not present

## 2023-10-10 DIAGNOSIS — Z4682 Encounter for fitting and adjustment of non-vascular catheter: Secondary | ICD-10-CM | POA: Diagnosis not present

## 2023-10-10 DIAGNOSIS — E039 Hypothyroidism, unspecified: Secondary | ICD-10-CM | POA: Diagnosis present

## 2023-10-10 DIAGNOSIS — M8580 Other specified disorders of bone density and structure, unspecified site: Secondary | ICD-10-CM | POA: Diagnosis present

## 2023-10-10 DIAGNOSIS — Z882 Allergy status to sulfonamides status: Secondary | ICD-10-CM | POA: Diagnosis not present

## 2023-10-10 DIAGNOSIS — C342 Malignant neoplasm of middle lobe, bronchus or lung: Secondary | ICD-10-CM | POA: Diagnosis present

## 2023-10-10 HISTORY — DX: Personal history of other diseases of the digestive system: Z87.19

## 2023-10-10 LAB — PULMONARY FUNCTION TEST
DL/VA % pred: 69 %
DL/VA: 2.89 ml/min/mmHg/L
DLCO cor % pred: 83 %
DLCO cor: 14.7 ml/min/mmHg
DLCO unc % pred: 82 %
DLCO unc: 14.61 ml/min/mmHg
FEF 25-75 Pre: 1.75 L/s
FEF2575-%Pred-Pre: 125 %
FEV1-%Pred-Pre: 118 %
FEV1-Pre: 2.15 L
FEV1FVC-%Pred-Pre: 102 %
FEV6-%Pred-Pre: 121 %
FEV6-Pre: 2.82 L
FEV6FVC-%Pred-Pre: 105 %
FVC-%Pred-Pre: 115 %
FVC-Pre: 2.82 L
Pre FEV1/FVC ratio: 76 %
Pre FEV6/FVC Ratio: 100 %
RV % pred: 106 %
RV: 2.41 L
TLC % pred: 116 %
TLC: 5.52 L

## 2023-10-10 LAB — COMPREHENSIVE METABOLIC PANEL
ALT: 22 U/L (ref 0–44)
AST: 23 U/L (ref 15–41)
Albumin: 3.9 g/dL (ref 3.5–5.0)
Alkaline Phosphatase: 77 U/L (ref 38–126)
Anion gap: 8 (ref 5–15)
BUN: 11 mg/dL (ref 8–23)
CO2: 26 mmol/L (ref 22–32)
Calcium: 10.6 mg/dL — ABNORMAL HIGH (ref 8.9–10.3)
Chloride: 107 mmol/L (ref 98–111)
Creatinine, Ser: 0.65 mg/dL (ref 0.44–1.00)
GFR, Estimated: >60 mL/min (ref >60)
Glucose, Bld: 101 mg/dL — ABNORMAL HIGH (ref 70–99)
Potassium: 4.3 mmol/L (ref 3.5–5.1)
Sodium: 141 mmol/L (ref 135–145)
Total Bilirubin: 0.3 mg/dL (ref 0.0–1.2)
Total Protein: 6.8 g/dL (ref 6.5–8.1)

## 2023-10-10 LAB — SURGICAL PCR SCREEN
MRSA, PCR: NEGATIVE
Staphylococcus aureus: NEGATIVE

## 2023-10-10 LAB — URINALYSIS, ROUTINE W REFLEX MICROSCOPIC
Bacteria, UA: NONE SEEN
Bilirubin Urine: NEGATIVE
Glucose, UA: NEGATIVE mg/dL
Hgb urine dipstick: NEGATIVE
Ketones, ur: NEGATIVE mg/dL
Nitrite: NEGATIVE
Protein, ur: NEGATIVE mg/dL
Specific Gravity, Urine: 1.013 (ref 1.005–1.030)
pH: 5 (ref 5.0–8.0)

## 2023-10-10 LAB — CBC
HCT: 40.1 % (ref 36.0–46.0)
Hemoglobin: 12.8 g/dL (ref 12.0–15.0)
MCH: 30.5 pg (ref 26.0–34.0)
MCHC: 31.9 g/dL (ref 30.0–36.0)
MCV: 95.7 fL (ref 80.0–100.0)
Platelets: 280 10*3/uL (ref 150–400)
RBC: 4.19 MIL/uL (ref 3.87–5.11)
RDW: 13.4 % (ref 11.5–15.5)
WBC: 5.9 10*3/uL (ref 4.0–10.5)
nRBC: 0 % (ref 0.0–0.2)

## 2023-10-10 LAB — APTT: aPTT: 30 s (ref 24–36)

## 2023-10-10 LAB — PROTIME-INR
INR: 1 (ref 0.8–1.2)
Prothrombin Time: 13.7 s (ref 11.4–15.2)

## 2023-10-10 NOTE — Progress Notes (Signed)
 PCP - Dr. Alberta Sharps Cardiologist - denies  PPM/ICD - denies   Chest x-ray - 10/10/23 EKG - 10/10/23 Stress Test - denies ECHO - denies Cardiac Cath - denies  Sleep Study - denies   DM- denies  Last dose of GLP1 agonist-  n/a   ASA/Blood Thinner Instructions: n/a   ERAS Protcol - no, NPO   COVID TEST- n/a   Anesthesia review: no  Patient denies shortness of breath, fever, cough and chest pain at PAT appointment   All instructions explained to the patient, with a verbal understanding of the material. Patient agrees to go over the instructions while at home for a better understanding.  The opportunity to ask questions was provided.

## 2023-10-10 NOTE — Progress Notes (Signed)
Darius Bump, RN, notified about abnormal UA

## 2023-10-12 ENCOUNTER — Encounter (HOSPITAL_COMMUNITY): Payer: Self-pay | Admitting: Thoracic Surgery (Cardiothoracic Vascular Surgery)

## 2023-10-12 ENCOUNTER — Inpatient Hospital Stay (HOSPITAL_COMMUNITY): Payer: Medicare PPO

## 2023-10-12 ENCOUNTER — Inpatient Hospital Stay (HOSPITAL_COMMUNITY): Payer: Self-pay

## 2023-10-12 ENCOUNTER — Encounter (HOSPITAL_COMMUNITY)
Admission: RE | Disposition: A | Discharge status: Home Health | Payer: Self-pay | Source: Home / Self Care | Attending: Thoracic Surgery (Cardiothoracic Vascular Surgery)

## 2023-10-12 ENCOUNTER — Inpatient Hospital Stay (HOSPITAL_COMMUNITY)
Admission: RE | Admit: 2023-10-12 | Discharge: 2023-10-16 | DRG: 164 | Disposition: A | Discharge status: Home Health | Payer: Medicare PPO | Attending: Thoracic Surgery (Cardiothoracic Vascular Surgery) | Admitting: Thoracic Surgery (Cardiothoracic Vascular Surgery)

## 2023-10-12 ENCOUNTER — Other Ambulatory Visit: Payer: Self-pay

## 2023-10-12 DIAGNOSIS — Z823 Family history of stroke: Secondary | ICD-10-CM | POA: Diagnosis not present

## 2023-10-12 DIAGNOSIS — K449 Diaphragmatic hernia without obstruction or gangrene: Secondary | ICD-10-CM | POA: Diagnosis not present

## 2023-10-12 DIAGNOSIS — Z8249 Family history of ischemic heart disease and other diseases of the circulatory system: Secondary | ICD-10-CM | POA: Diagnosis not present

## 2023-10-12 DIAGNOSIS — E039 Hypothyroidism, unspecified: Secondary | ICD-10-CM | POA: Diagnosis present

## 2023-10-12 DIAGNOSIS — Y658 Other specified misadventures during surgical and medical care: Secondary | ICD-10-CM | POA: Diagnosis not present

## 2023-10-12 DIAGNOSIS — Z79890 Hormone replacement therapy (postmenopausal): Secondary | ICD-10-CM | POA: Diagnosis not present

## 2023-10-12 DIAGNOSIS — M8580 Other specified disorders of bone density and structure, unspecified site: Secondary | ICD-10-CM | POA: Diagnosis present

## 2023-10-12 DIAGNOSIS — S0502XA Injury of conjunctiva and corneal abrasion without foreign body, left eye, initial encounter: Secondary | ICD-10-CM | POA: Diagnosis not present

## 2023-10-12 DIAGNOSIS — Z85828 Personal history of other malignant neoplasm of skin: Secondary | ICD-10-CM

## 2023-10-12 DIAGNOSIS — Z79899 Other long term (current) drug therapy: Secondary | ICD-10-CM | POA: Diagnosis not present

## 2023-10-12 DIAGNOSIS — R911 Solitary pulmonary nodule: Secondary | ICD-10-CM

## 2023-10-12 DIAGNOSIS — D7589 Other specified diseases of blood and blood-forming organs: Secondary | ICD-10-CM | POA: Diagnosis present

## 2023-10-12 DIAGNOSIS — T8182XA Emphysema (subcutaneous) resulting from a procedure, initial encounter: Secondary | ICD-10-CM | POA: Diagnosis not present

## 2023-10-12 DIAGNOSIS — Z888 Allergy status to other drugs, medicaments and biological substances status: Secondary | ICD-10-CM | POA: Diagnosis not present

## 2023-10-12 DIAGNOSIS — C342 Malignant neoplasm of middle lobe, bronchus or lung: Secondary | ICD-10-CM | POA: Diagnosis present

## 2023-10-12 DIAGNOSIS — M1990 Unspecified osteoarthritis, unspecified site: Secondary | ICD-10-CM | POA: Diagnosis present

## 2023-10-12 DIAGNOSIS — K219 Gastro-esophageal reflux disease without esophagitis: Secondary | ICD-10-CM | POA: Diagnosis present

## 2023-10-12 DIAGNOSIS — Z4682 Encounter for fitting and adjustment of non-vascular catheter: Secondary | ICD-10-CM | POA: Diagnosis not present

## 2023-10-12 DIAGNOSIS — Z882 Allergy status to sulfonamides status: Secondary | ICD-10-CM | POA: Diagnosis not present

## 2023-10-12 DIAGNOSIS — F419 Anxiety disorder, unspecified: Secondary | ICD-10-CM

## 2023-10-12 DIAGNOSIS — Z902 Acquired absence of lung [part of]: Secondary | ICD-10-CM

## 2023-10-12 DIAGNOSIS — J9811 Atelectasis: Secondary | ICD-10-CM | POA: Diagnosis not present

## 2023-10-12 DIAGNOSIS — Z48813 Encounter for surgical aftercare following surgery on the respiratory system: Secondary | ICD-10-CM | POA: Diagnosis not present

## 2023-10-12 DIAGNOSIS — R918 Other nonspecific abnormal finding of lung field: Secondary | ICD-10-CM | POA: Diagnosis present

## 2023-10-12 DIAGNOSIS — Z8049 Family history of malignant neoplasm of other genital organs: Secondary | ICD-10-CM

## 2023-10-12 DIAGNOSIS — D472 Monoclonal gammopathy: Secondary | ICD-10-CM | POA: Diagnosis present

## 2023-10-12 DIAGNOSIS — E785 Hyperlipidemia, unspecified: Secondary | ICD-10-CM | POA: Diagnosis present

## 2023-10-12 DIAGNOSIS — Z806 Family history of leukemia: Secondary | ICD-10-CM

## 2023-10-12 DIAGNOSIS — T797XXA Traumatic subcutaneous emphysema, initial encounter: Secondary | ICD-10-CM | POA: Diagnosis not present

## 2023-10-12 DIAGNOSIS — J939 Pneumothorax, unspecified: Secondary | ICD-10-CM | POA: Diagnosis not present

## 2023-10-12 DIAGNOSIS — J9383 Other pneumothorax: Secondary | ICD-10-CM | POA: Diagnosis not present

## 2023-10-12 HISTORY — PX: NODE DISSECTION: SHX5269

## 2023-10-12 HISTORY — PX: INTERCOSTAL NERVE BLOCK: SHX5021

## 2023-10-12 LAB — ABO/RH: ABO/RH(D): A POS

## 2023-10-12 LAB — PREPARE RBC (CROSSMATCH)

## 2023-10-12 SURGERY — LOBECTOMY, LUNG, ROBOT-ASSISTED, USING VATS
Anesthesia: General | Site: Chest | Laterality: Right

## 2023-10-12 MED ORDER — BISACODYL 5 MG PO TBEC
10.0000 mg | DELAYED_RELEASE_TABLET | Freq: Every day | ORAL | Status: DC
  Administered 2023-10-12 – 2023-10-16 (×5): 10 mg via ORAL
  Filled 2023-10-12 (×6): qty 2

## 2023-10-12 MED ORDER — ONDANSETRON HCL 4 MG/2ML IJ SOLN: 4.0000 mg | Freq: Four times a day (QID) | INTRAMUSCULAR | Status: DC | PRN

## 2023-10-12 MED ORDER — FENTANYL CITRATE (PF) 250 MCG/5ML IJ SOLN
INTRAMUSCULAR | Status: AC
  Filled 2023-10-12: qty 5

## 2023-10-12 MED ORDER — PROPOFOL 10 MG/ML IV BOLUS
INTRAVENOUS | Status: AC
  Filled 2023-10-12: qty 20

## 2023-10-12 MED ORDER — ALBUMIN HUMAN 5 % IV SOLN: INTRAVENOUS | Status: DC | PRN

## 2023-10-12 MED ORDER — LACTATED RINGERS IV SOLN: INTRAVENOUS | Status: DC | PRN

## 2023-10-12 MED ORDER — ROCURONIUM BROMIDE 10 MG/ML (PF) SYRINGE
PREFILLED_SYRINGE | INTRAVENOUS | Status: DC | PRN
  Administered 2023-10-12: 10 mg via INTRAVENOUS
  Administered 2023-10-12: 30 mg via INTRAVENOUS
  Administered 2023-10-12: 20 mg via INTRAVENOUS
  Administered 2023-10-12: 50 mg via INTRAVENOUS
  Administered 2023-10-12: 20 mg via INTRAVENOUS

## 2023-10-12 MED ORDER — FLUOXETINE HCL 20 MG PO CAPS
20.0000 mg | ORAL_CAPSULE | Freq: Every day | ORAL | Status: DC
  Administered 2023-10-13 – 2023-10-16 (×4): 20 mg via ORAL
  Filled 2023-10-12 (×5): qty 1

## 2023-10-12 MED ORDER — ACETAMINOPHEN 10 MG/ML IV SOLN
INTRAVENOUS | Status: AC
  Filled 2023-10-12: qty 100

## 2023-10-12 MED ORDER — LACTATED RINGERS IV SOLN: INTRAVENOUS | Status: DC

## 2023-10-12 MED ORDER — POLYVINYL ALCOHOL 1.4 % OP SOLN
1.0000 [drp] | Freq: Two times a day (BID) | OPHTHALMIC | Status: DC
  Administered 2023-10-12 – 2023-10-15 (×7): 1 [drp] via OPHTHALMIC
  Filled 2023-10-12: qty 15

## 2023-10-12 MED ORDER — OXYCODONE HCL 5 MG PO TABS: 5.0000 mg | ORAL_TABLET | Freq: Once | ORAL | Status: DC | PRN

## 2023-10-12 MED ORDER — PHENYLEPHRINE HCL-NACL 20-0.9 MG/250ML-% IV SOLN
INTRAVENOUS | Status: DC | PRN
  Administered 2023-10-12: 30 ug/min via INTRAVENOUS

## 2023-10-12 MED ORDER — ORAL CARE MOUTH RINSE: 15.0000 mL | OROMUCOSAL | Status: DC | PRN

## 2023-10-12 MED ORDER — LIDOCAINE 2% (20 MG/ML) 5 ML SYRINGE
INTRAMUSCULAR | Status: DC | PRN
  Administered 2023-10-12: 60 mg via INTRAVENOUS

## 2023-10-12 MED ORDER — HEMOSTATIC AGENTS (NO CHARGE) OPTIME
TOPICAL | Status: DC | PRN
  Administered 2023-10-12: 1 via TOPICAL

## 2023-10-12 MED ORDER — EPHEDRINE 5 MG/ML INJ
INTRAVENOUS | Status: AC
  Filled 2023-10-12: qty 5

## 2023-10-12 MED ORDER — ONDANSETRON HCL 4 MG/2ML IJ SOLN
INTRAMUSCULAR | Status: DC | PRN
  Administered 2023-10-12: 4 mg via INTRAVENOUS

## 2023-10-12 MED ORDER — KETOROLAC TROMETHAMINE 0.5 % OP SOLN
OPHTHALMIC | Status: AC
Start: 2023-10-12 — End: 2023-10-13
  Filled 2023-10-12: qty 5

## 2023-10-12 MED ORDER — DEXAMETHASONE SODIUM PHOSPHATE 10 MG/ML IJ SOLN
INTRAMUSCULAR | Status: DC | PRN
  Administered 2023-10-12: 10 mg via INTRAVENOUS

## 2023-10-12 MED ORDER — LEVOTHYROXINE SODIUM 25 MCG PO TABS
25.0000 ug | ORAL_TABLET | Freq: Every day | ORAL | Status: DC
Start: 2023-10-13 — End: 2023-10-16
  Administered 2023-10-13 – 2023-10-16 (×4): 25 ug via ORAL
  Filled 2023-10-12 (×4): qty 1

## 2023-10-12 MED ORDER — TRAMADOL HCL 50 MG PO TABS
50.0000 mg | ORAL_TABLET | Freq: Four times a day (QID) | ORAL | Status: DC | PRN
Start: 2023-10-12 — End: 2023-10-16
  Administered 2023-10-14 – 2023-10-15 (×3): 50 mg via ORAL
  Administered 2023-10-15 – 2023-10-16 (×3): 100 mg via ORAL
  Filled 2023-10-12 (×3): qty 1
  Filled 2023-10-12 (×3): qty 2

## 2023-10-12 MED ORDER — ENOXAPARIN SODIUM 40 MG/0.4ML IJ SOSY
40.0000 mg | PREFILLED_SYRINGE | Freq: Every day | INTRAMUSCULAR | Status: DC
  Administered 2023-10-13 – 2023-10-15 (×3): 40 mg via SUBCUTANEOUS
  Filled 2023-10-12 (×4): qty 0.4

## 2023-10-12 MED ORDER — DEXAMETHASONE SODIUM PHOSPHATE 10 MG/ML IJ SOLN
INTRAMUSCULAR | Status: AC
  Filled 2023-10-12: qty 1

## 2023-10-12 MED ORDER — SODIUM CHLORIDE 0.45 % IV SOLN: INTRAVENOUS | Status: DC

## 2023-10-12 MED ORDER — MORPHINE SULFATE (PF) 2 MG/ML IV SOLN: 2.0000 mg | INTRAVENOUS | Status: DC | PRN

## 2023-10-12 MED ORDER — SUGAMMADEX SODIUM 200 MG/2ML IV SOLN
INTRAVENOUS | Status: DC | PRN
  Administered 2023-10-12: 200 mg via INTRAVENOUS

## 2023-10-12 MED ORDER — 0.9 % SODIUM CHLORIDE (POUR BTL) OPTIME
TOPICAL | Status: DC | PRN
  Administered 2023-10-12: 1000 mL

## 2023-10-12 MED ORDER — ATORVASTATIN CALCIUM 40 MG PO TABS
40.0000 mg | ORAL_TABLET | Freq: Every day | ORAL | Status: DC
  Administered 2023-10-13 – 2023-10-16 (×4): 40 mg via ORAL
  Filled 2023-10-12 (×5): qty 1

## 2023-10-12 MED ORDER — EPHEDRINE SULFATE-NACL 50-0.9 MG/10ML-% IV SOSY
PREFILLED_SYRINGE | INTRAVENOUS | Status: DC | PRN
  Administered 2023-10-12: 5 mg via INTRAVENOUS
  Administered 2023-10-12 (×2): 2.5 mg via INTRAVENOUS

## 2023-10-12 MED ORDER — GABAPENTIN 300 MG PO CAPS
300.0000 mg | ORAL_CAPSULE | Freq: Two times a day (BID) | ORAL | Status: DC
  Administered 2023-10-15 – 2023-10-16 (×2): 300 mg via ORAL
  Filled 2023-10-12 (×2): qty 1

## 2023-10-12 MED ORDER — ACETAMINOPHEN 160 MG/5ML PO SOLN: 1000.0000 mg | Freq: Four times a day (QID) | ORAL | Status: DC

## 2023-10-12 MED ORDER — ONDANSETRON HCL 4 MG/2ML IJ SOLN
INTRAMUSCULAR | Status: AC
  Filled 2023-10-12: qty 2

## 2023-10-12 MED ORDER — ACETAMINOPHEN 500 MG PO TABS
1000.0000 mg | ORAL_TABLET | Freq: Four times a day (QID) | ORAL | Status: DC
  Administered 2023-10-12 – 2023-10-16 (×13): 1000 mg via ORAL
  Filled 2023-10-12 (×14): qty 2

## 2023-10-12 MED ORDER — CHLORHEXIDINE GLUCONATE 0.12 % MT SOLN
15.0000 mL | Freq: Once | OROMUCOSAL | Status: AC
  Administered 2023-10-12: 15 mL via OROMUCOSAL
  Filled 2023-10-12: qty 15

## 2023-10-12 MED ORDER — ROCURONIUM BROMIDE 10 MG/ML (PF) SYRINGE
PREFILLED_SYRINGE | INTRAVENOUS | Status: AC
  Filled 2023-10-12: qty 10

## 2023-10-12 MED ORDER — CEFAZOLIN SODIUM-DEXTROSE 2-4 GM/100ML-% IV SOLN
2.0000 g | Freq: Three times a day (TID) | INTRAVENOUS | Status: AC
  Administered 2023-10-12 – 2023-10-13 (×2): 2 g via INTRAVENOUS
  Filled 2023-10-12 (×2): qty 100

## 2023-10-12 MED ORDER — PROSIGHT PO TABS
1.0000 | ORAL_TABLET | Freq: Two times a day (BID) | ORAL | Status: DC
  Administered 2023-10-12 – 2023-10-16 (×7): 1 via ORAL
  Filled 2023-10-12 (×10): qty 1

## 2023-10-12 MED ORDER — ORAL CARE MOUTH RINSE: 15.0000 mL | Freq: Once | OROMUCOSAL | Status: AC

## 2023-10-12 MED ORDER — KETOROLAC TROMETHAMINE 15 MG/ML IJ SOLN
15.0000 mg | Freq: Four times a day (QID) | INTRAMUSCULAR | Status: AC | PRN
  Administered 2023-10-13: 15 mg via INTRAVENOUS
  Filled 2023-10-12: qty 1

## 2023-10-12 MED ORDER — GLYCOPYRROLATE 0.2 MG/ML IJ SOLN
INTRAMUSCULAR | Status: DC | PRN
  Administered 2023-10-12: .1 mg via INTRAVENOUS

## 2023-10-12 MED ORDER — BSS IO SOLN
INTRAOCULAR | Status: AC
  Filled 2023-10-12: qty 15

## 2023-10-12 MED ORDER — PROPOFOL 10 MG/ML IV BOLUS
INTRAVENOUS | Status: DC | PRN
  Administered 2023-10-12: 125 ug/kg/min via INTRAVENOUS
  Administered 2023-10-12: 50 mg via INTRAVENOUS
  Administered 2023-10-12: 100 mg via INTRAVENOUS

## 2023-10-12 MED ORDER — LIDOCAINE 2% (20 MG/ML) 5 ML SYRINGE
INTRAMUSCULAR | Status: AC
  Filled 2023-10-12: qty 5

## 2023-10-12 MED ORDER — BUPIVACAINE LIPOSOME 1.3 % IJ SUSP
INTRAMUSCULAR | Status: AC
  Filled 2023-10-12: qty 20

## 2023-10-12 MED ORDER — PANTOPRAZOLE SODIUM 40 MG PO TBEC: 40.0000 mg | DELAYED_RELEASE_TABLET | Freq: Every day | ORAL | Status: DC

## 2023-10-12 MED ORDER — PANTOPRAZOLE SODIUM 40 MG PO TBEC
40.0000 mg | DELAYED_RELEASE_TABLET | Freq: Every day | ORAL | Status: DC
  Administered 2023-10-13 – 2023-10-16 (×5): 40 mg via ORAL
  Filled 2023-10-12 (×6): qty 1

## 2023-10-12 MED ORDER — SODIUM CHLORIDE 0.9% IV SOLUTION
INTRAVENOUS | Status: AC | PRN
  Administered 2023-10-12: 1000 mL

## 2023-10-12 MED ORDER — FENTANYL CITRATE (PF) 100 MCG/2ML IJ SOLN: 25.0000 ug | INTRAMUSCULAR | Status: DC | PRN

## 2023-10-12 MED ORDER — FENTANYL CITRATE (PF) 250 MCG/5ML IJ SOLN
INTRAMUSCULAR | Status: DC | PRN
  Administered 2023-10-12: 100 ug via INTRAVENOUS
  Administered 2023-10-12 (×2): 75 ug via INTRAVENOUS

## 2023-10-12 MED ORDER — ACETAMINOPHEN 10 MG/ML IV SOLN
INTRAVENOUS | Status: DC | PRN
  Administered 2023-10-12: 1000 mg via INTRAVENOUS

## 2023-10-12 MED ORDER — BSS IO SOLN: 15.0000 mL | Freq: Once | INTRAOCULAR | Status: AC

## 2023-10-12 MED ORDER — CEFAZOLIN SODIUM-DEXTROSE 2-4 GM/100ML-% IV SOLN
2.0000 g | INTRAVENOUS | Status: AC
  Administered 2023-10-12 (×2): 2 g via INTRAVENOUS
  Filled 2023-10-12: qty 100

## 2023-10-12 MED ORDER — SENNOSIDES-DOCUSATE SODIUM 8.6-50 MG PO TABS
1.0000 | ORAL_TABLET | Freq: Every day | ORAL | Status: DC
  Administered 2023-10-12 – 2023-10-14 (×3): 1 via ORAL
  Filled 2023-10-12 (×4): qty 1

## 2023-10-12 MED ORDER — POLYETHYL GLYCOL-PROPYL GLYCOL 0.4-0.3 % OP SOLN: 1.0000 [drp] | Freq: Two times a day (BID) | OPHTHALMIC | Status: DC

## 2023-10-12 MED ORDER — BUPIVACAINE HCL (PF) 0.5 % IJ SOLN
INTRAMUSCULAR | Status: AC
  Filled 2023-10-12: qty 30

## 2023-10-12 MED ORDER — OXYCODONE HCL 5 MG PO TABS
5.0000 mg | ORAL_TABLET | ORAL | Status: DC | PRN
  Administered 2023-10-13: 5 mg via ORAL
  Filled 2023-10-12: qty 1

## 2023-10-12 MED ORDER — KETOROLAC TROMETHAMINE 0.5 % OP SOLN
1.0000 [drp] | Freq: Four times a day (QID) | OPHTHALMIC | Status: DC
  Administered 2023-10-12: 1 [drp] via OPHTHALMIC

## 2023-10-12 MED ORDER — GABAPENTIN 300 MG PO CAPS
300.0000 mg | ORAL_CAPSULE | Freq: Every day | ORAL | Status: AC
  Administered 2023-10-12 – 2023-10-14 (×3): 300 mg via ORAL
  Filled 2023-10-12 (×3): qty 1

## 2023-10-12 MED ORDER — OXYCODONE HCL 5 MG/5ML PO SOLN: 5.0000 mg | Freq: Once | ORAL | Status: DC | PRN

## 2023-10-12 MED ORDER — SODIUM CHLORIDE FLUSH 0.9 % IV SOLN
INTRAVENOUS | Status: DC | PRN
  Administered 2023-10-12: 100 mL

## 2023-10-12 SURGICAL SUPPLY — 80 items
CANISTER SUCT 3000ML PPV (MISCELLANEOUS) ×4 IMPLANT
CANNULA REDUCER 12-8 DVNC XI (CANNULA) ×4 IMPLANT
CATH THOR STR 28F SOFT WA (CATHETERS) IMPLANT
CLIP TI WIDE RED SMALL 6 (CLIP) IMPLANT
CNTNR URN SCR LID CUP LEK RST (MISCELLANEOUS) ×10 IMPLANT
CONN ST 1/4X3/8 BEN (MISCELLANEOUS) IMPLANT
COVER TIP SHEARS 8 DVNC (MISCELLANEOUS) IMPLANT
DEFOGGER SCOPE WARMER CLEARIFY (MISCELLANEOUS) ×2 IMPLANT
DERMABOND ADVANCED .7 DNX12 (GAUZE/BANDAGES/DRESSINGS) ×2 IMPLANT
DRAIN CHANNEL 28F RND 3/8 FF (WOUND CARE) IMPLANT
DRAIN CHANNEL 32F RND 10.7 FF (WOUND CARE) IMPLANT
DRAPE ARM DVNC X/XI (DISPOSABLE) ×8 IMPLANT
DRAPE COLUMN DVNC XI (DISPOSABLE) ×2 IMPLANT
DRAPE CV SPLIT W-CLR ANES SCRN (DRAPES) ×2 IMPLANT
DRAPE HALF SHEET 40X57 (DRAPES) ×2 IMPLANT
DRAPE SURG ORHT 6 SPLT 77X108 (DRAPES) ×2 IMPLANT
DRIVER NDL MEGA SUTCUT DVNCXI (INSTRUMENTS) IMPLANT
DRIVER NDLE MEGA SUTCUT DVNCXI (INSTRUMENTS) ×1 IMPLANT
ELECT BLADE 6.5 EXT (BLADE) ×2 IMPLANT
ELECT REM PT RETURN 9FT ADLT (ELECTROSURGICAL) ×1 IMPLANT
ELECTRODE REM PT RTRN 9FT ADLT (ELECTROSURGICAL) ×2 IMPLANT
FORCEPS BPLR FENES DVNC XI (FORCEP) IMPLANT
FORCEPS BPLR LNG DVNC XI (INSTRUMENTS) IMPLANT
GAUZE KITTNER 4X5 RF (MISCELLANEOUS) ×4 IMPLANT
GAUZE SPONGE 4X4 12PLY STRL (GAUZE/BANDAGES/DRESSINGS) ×2 IMPLANT
GLOVE SS BIOGEL STRL SZ 7.5 (GLOVE) ×4 IMPLANT
GLOVE SURG POLYISO LF SZ8 (GLOVE) ×2 IMPLANT
GOWN STRL REUS W/ TWL LRG LVL3 (GOWN DISPOSABLE) ×4 IMPLANT
GOWN STRL REUS W/ TWL XL LVL3 (GOWN DISPOSABLE) ×4 IMPLANT
GOWN STRL REUS W/TWL 2XL LVL3 (GOWN DISPOSABLE) ×2 IMPLANT
GRASPER TIP-UP FEN DVNC XI (INSTRUMENTS) IMPLANT
HEMOSTAT SURGICEL 2X14 (HEMOSTASIS) ×6 IMPLANT
IRRIGATION STRYKERFLOW (MISCELLANEOUS) ×2 IMPLANT
IRRIGATOR STRYKERFLOW (MISCELLANEOUS) ×1 IMPLANT
KIT BASIN OR (CUSTOM PROCEDURE TRAY) ×2 IMPLANT
KIT SUCTION CATH 14FR (SUCTIONS) IMPLANT
KIT TURNOVER KIT B (KITS) ×2 IMPLANT
NDL HYPO 25GX1X1/2 BEV (NEEDLE) ×2 IMPLANT
NDL SPNL 22GX3.5 QUINCKE BK (NEEDLE) ×2 IMPLANT
NEEDLE HYPO 25GX1X1/2 BEV (NEEDLE) ×1 IMPLANT
NEEDLE SPNL 22GX3.5 QUINCKE BK (NEEDLE) ×1 IMPLANT
NS IRRIG 1000ML POUR BTL (IV SOLUTION) ×4 IMPLANT
PACK CHEST (CUSTOM PROCEDURE TRAY) ×2 IMPLANT
PAD ARMBOARD 7.5X6 YLW CONV (MISCELLANEOUS) ×4 IMPLANT
PORT ACCESS TROCAR AIRSEAL 12 (TROCAR) ×2 IMPLANT
RELOAD STAPLE 45 2.0 GRY DVNC (STAPLE) IMPLANT
RELOAD STAPLE 45 2.5 WHT DVNC (STAPLE) IMPLANT
RELOAD STAPLE 45 3.5 BLU DVNC (STAPLE) IMPLANT
RELOAD STAPLE 45 4.3 GRN DVNC (STAPLE) IMPLANT
SCISSORS LAP 5X35 DISP (ENDOMECHANICALS) IMPLANT
SCISSORS MNPLR CVD DVNC XI (INSTRUMENTS) IMPLANT
SEAL UNIV 5-12 XI (MISCELLANEOUS) ×8 IMPLANT
SET TRI-LUMEN FLTR TB AIRSEAL (TUBING) ×2 IMPLANT
SOL ELECTROSURG ANTI STICK (MISCELLANEOUS) ×1 IMPLANT
SOLUTION ELECTROSURG ANTI STCK (MISCELLANEOUS) ×2 IMPLANT
SPONGE INTESTINAL PEANUT (DISPOSABLE) IMPLANT
SPONGE TONSIL 1 RF SGL (DISPOSABLE) IMPLANT
STAPLE RELOAD 45 2.0 GRAY DVNC (STAPLE) ×1 IMPLANT
STAPLER 45 SUREFORM CVD DVNC (STAPLE) IMPLANT
STAPLER RELOAD 2.5X45 WHT DVNC (STAPLE) ×5 IMPLANT
STAPLER RELOAD 3.5X45 BLU DVNC (STAPLE) ×7 IMPLANT
STAPLER RELOAD 4.3X45 GRN DVNC (STAPLE) ×7 IMPLANT
SUT PROLENE 4-0 RB1 .5 CRCL 36 (SUTURE) IMPLANT
SUT SILK 1 MH (SUTURE) ×2 IMPLANT
SUT SILK 2 0 SH (SUTURE) ×2 IMPLANT
SUT SILK 2 0SH CR/8 30 (SUTURE) IMPLANT
SUT SILK 3 0SH CR/8 30 (SUTURE) IMPLANT
SUT VIC AB 1 CTX36XBRD ANBCTR (SUTURE) ×2 IMPLANT
SUT VIC AB 2-0 CTX 36 (SUTURE) ×2 IMPLANT
SUT VIC AB 3-0 X1 27 (SUTURE) ×4 IMPLANT
SUT VICRYL 0 TIES 12 18 (SUTURE) ×2 IMPLANT
SUT VICRYL 0 UR6 27IN ABS (SUTURE) ×4 IMPLANT
SUT VICRYL 2 TP 1 (SUTURE) IMPLANT
SYR 20CC LL (SYRINGE) ×4 IMPLANT
SYSTEM RETRIEVAL ANCHOR 8 (MISCELLANEOUS) IMPLANT
SYSTEM SAHARA CHEST DRAIN ATS (WOUND CARE) ×2 IMPLANT
TAPE CLOTH 4X10 WHT NS (GAUZE/BANDAGES/DRESSINGS) ×2 IMPLANT
TOWEL GREEN STERILE (TOWEL DISPOSABLE) ×2 IMPLANT
TRAY FOLEY W/BAG SLVR 14FR (SET/KITS/TRAYS/PACK) IMPLANT
WATER STERILE IRR 1000ML POUR (IV SOLUTION) ×4 IMPLANT

## 2023-10-12 NOTE — Anesthesia Procedure Notes (Signed)
 Procedure Name: Intubation Date/Time: 10/12/2023 12:14 PM  Performed by: Bethene Verlin LABOR, RNPre-anesthesia Checklist: Patient identified, Emergency Drugs available, Suction available and Patient being monitored Patient Re-evaluated:Patient Re-evaluated prior to induction Oxygen Delivery Method: Circle System Utilized Preoxygenation: Pre-oxygenation with 100% oxygen Induction Type: IV induction Ventilation: Mask ventilation without difficulty Laryngoscope Size: Mac and 3 Grade View: Grade I Tube type: Oral Endobronchial tube: Double lumen EBT and Left Tube size: 7.0 mm Number of attempts: 1 Airway Equipment and Method: Stylet, Oral airway and Fiberoptic brochoscope Placement Confirmation: ETT inserted through vocal cords under direct vision, positive ETCO2 and breath sounds checked- equal and bilateral Secured at: 29 cm Tube secured with: Tape Dental Injury: Teeth and Oropharynx as per pre-operative assessment

## 2023-10-12 NOTE — Anesthesia Preprocedure Evaluation (Signed)
 Anesthesia Evaluation  Patient identified by MRN, date of birth, ID band Patient awake    Reviewed: Allergy & Precautions, H&P , NPO status , Patient's Chart, lab work & pertinent test results  Airway Mallampati: II   Neck ROM: full    Dental   Pulmonary  RML mass   breath sounds clear to auscultation       Cardiovascular negative cardio ROS  Rhythm:regular Rate:Normal     Neuro/Psych   Anxiety        GI/Hepatic hiatal hernia,GERD  ,,  Endo/Other  Hypothyroidism    Renal/GU      Musculoskeletal  (+) Arthritis ,    Abdominal   Peds  Hematology   Anesthesia Other Findings   Reproductive/Obstetrics                             Anesthesia Physical Anesthesia Plan  ASA: 3  Anesthesia Plan: General   Post-op Pain Management:    Induction: Intravenous  PONV Risk Score and Plan: 3 and Ondansetron , Dexamethasone  and Treatment may vary due to age or medical condition  Airway Management Planned: Double Lumen EBT  Additional Equipment: Arterial line  Intra-op Plan:   Post-operative Plan: Extubation in OR  Informed Consent: I have reviewed the patients History and Physical, chart, labs and discussed the procedure including the risks, benefits and alternatives for the proposed anesthesia with the patient or authorized representative who has indicated his/her understanding and acceptance.     Dental advisory given  Plan Discussed with: CRNA, Anesthesiologist and Surgeon  Anesthesia Plan Comments:        Anesthesia Quick Evaluation

## 2023-10-12 NOTE — Hospital Course (Addendum)
 History of Present Illness:  Audrey [name] is a 78 year old woman with a past medical history significant for hyperlipidemia, hypothyroidism, osteopenia, rib fractures, basal cell cancer of the skin, arthritis, reflux, MGUS, and a newly discovered right middle lobe mass.  She is being evaluated for hypercalcemia.  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  Bone marrow biopsy was negative for myeloma.  As part of her evaluation she had a PET.  There was no evidence of myeloma, but there was a 3 cm right middle lobe mass that was markedly hypermetabolic. She is a lifelong non-smoker.  Her parents were both smokers but neither have lung cancer.  She is fairly active.  Her walking is somewhat limited by balance issues, but she can walk up 2 flights of stairs without stopping because of her breathing.  No chest pain, pressure, tightness, or shortness of breath.  No change in appetite or weight loss.  No unusual headaches or visual changes.  She was evaluated by Dr. Kerrin who recommended MRI of the brain and Robotic Assisted Lung Resection.  The risks and benefits of the procedure were explained to the patient and she was agreeable to proceed.  Hospital Course:  Audrey [name] presented to [facility] on 10/12/2023.  She underwent Robotic Assisted Video Thoracoscopy with Right Middle Lobectomy, Intercostal Nerve Block, and Lymph Node Sampling.  She tolerated the procedure without difficulty, was extubated, and taken to the PACU in stable condition.  The patient complained of left eye pain/irritation.  It was felt she likely had a corneal abrasion post anesthesia.  He was treated with ophthalmic tears and polytrim  ointment.  Her chest tube output was very low.  She did not have an air leak from her chest tube.  Chest xray did not have a significant pneumothorax.  Her chest tube was removed without difficulty on 10/13/2023.  Follow up chest xray showed unchanged, small right apical pneumothorax.  The  patient has been resumed on home medications.  She remains in NSR.  She is ambulating without difficulty.  PT consult was performed and they recommended continuance of PT as outpatient.  Her surgical incisions are clean, dry and healing without signs of infection. She does have some sero sanguinous drainage from right chest tube wound, gauze dressing was recommended as needed. There is no sign of infection. She has been tolerating a diet. She is progressing with ambulation. Home PT has been arranged. She is stable for discharge home today.

## 2023-10-12 NOTE — Plan of Care (Signed)
   Problem: Education: Goal: Knowledge of General Education information will improve Description: Including pain rating scale, medication(s)/side effects and non-pharmacologic comfort measures Outcome: Progressing   Problem: Clinical Measurements: Goal: Ability to maintain clinical measurements within normal limits will improve Outcome: Progressing Goal: Will remain free from infection Outcome: Progressing

## 2023-10-12 NOTE — Transfer of Care (Signed)
 Immediate Anesthesia Transfer of Care Note  Patient: Audrey [name]  Procedure(s) Performed: XI ROBOTIC ASSISTED THORACOSCOPY- RIGHT MIDDLE LOBECTOMY (Right: Chest) INTERCOSTAL NERVE BLOCK (Right: Chest) NODE DISSECTION (Right: Chest)  Patient Location: PACU  Anesthesia Type:General  Level of Consciousness: awake and drowsy  Airway & Oxygen Therapy: Patient Spontanous Breathing and Patient connected to face mask oxygen  Post-op Assessment: Report given to RN and Post -op Vital signs reviewed and stable  Post vital signs: Reviewed and stable  Last Vitals:  Vitals Value Taken Time  BP 147/99 10/12/23 1721  Temp    Pulse 80 10/12/23 1724  Resp 18 10/12/23 1724  SpO2 95 % 10/12/23 1724  Vitals shown include unfiled device data.  Last Pain:  Vitals:   10/12/23 1025  PainSc: 0-No pain         Complications: No notable events documented.

## 2023-10-12 NOTE — Brief Op Note (Addendum)
 10/12/2023  5:05 PM  PATIENT:  Audrey [name]  78 y.o. 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,N0) NECROTIZING NODULE RIGHT UPPER LOBE  PROCEDURE:  Procedure(s): XI ROBOTIC ASSISTED THORACOSCOPY-  RIGHT UPPER LOBE WEDGE RESECTION RIGHT MIDDLE LOBECTOMY (Right) LYMPH NODE DISSECTION (Right) INTERCOSTAL NERVE BLOCK (Right)   SURGEON:  Surgeons and Role:    * Kerrin Elspeth BROCKS, MD - Primary  PHYSICIAN ASSISTANT: Rocky Shad PA-C  ASSISTANTS: none   ANESTHESIA:   general  EBL:  50 mL   BLOOD ADMINISTERED:none  DRAINS:  28 Chest tube    LOCAL MEDICATIONS USED:  BUPIVICAINE   SPECIMEN:  Source of Specimen:  Right middle lobe, vascular margins, lymph nodes  DISPOSITION OF SPECIMEN:  PATHOLOGY  COUNTS:  YES  TOURNIQUET:  * No tourniquets in log *  DICTATION: .Dragon Dictation  PLAN OF CARE: Admit to inpatient   PATIENT DISPOSITION:  PACU - hemodynamically stable.   Delay start of Pharmacological VTE agent (>24hrs) due to surgical blood loss or risk of bleeding: no

## 2023-10-12 NOTE — Interval H&P Note (Signed)
 History and Physical Interval Note:  FEV1 2.15 DLCO 83%  10/12/2023 11:09 AM  Audrey [name]  has presented today for surgery, with the diagnosis of RML lung mass.  The various methods of treatment have been discussed with the patient and family. After consideration of risks, benefits and other options for treatment, the patient has consented to  Procedure(s): XI ROBOTIC ASSISTED THORACOSCOPY- RIGHT MIDDLE LOBECTOMY (Right) as a surgical intervention.  The patient's history has been reviewed, patient examined, no change in status, stable for surgery.  I have reviewed the patient's chart and labs.  Questions were answered to the patient's satisfaction.     Elspeth JAYSON Millers

## 2023-10-12 NOTE — Op Note (Signed)
 Audrey [name], Audrey [name] MEDICAL RECORD NO: [id number] ACCOUNT NO: [id number] DATE OF BIRTH: Jun 07, 1946 FACILITY: [facility] LOCATION: [facility] PHYSICIAN: Elspeth BROCKS. Kerrin, MD  Operative Report   DATE OF PROCEDURE: 10/12/2023   PREOPERATIVE DIAGNOSIS: Right middle lobe lung mass.  POSTOPERATIVE DIAGNOSES: 1. Non-small cell carcinoma, right middle lobe. 2. Necrotizing nodule, right upper lobe.  PROCEDURE:  Xi robotic-assisted right upper lobe wedge resection,  Right middle lobectomy,  Lymph node dissection, and  Intercostal nerve blocks levels 3 through 10.  SURGEON: Elspeth BROCKS. Kerrin, MD  ASSISTANT: Rocky Shad, PA  Experienced assistance was necessary for this case due to surgical complexity. Erin Barrett assisted with port placement, camera management, robot docking, and undocking, instrument exchange, specimen retrieval, suctioning, and wound closure.   ANESTHESIA: General.  FINDINGS: Mass along the inferior margin of right upper lobe. Frozen section revealed necrotizing mass. No cancer or granuloma is seen. Right middle lobe mass frozen section showed non-small cell carcinoma in perivascular tissue adjacent to pulmonary artery. Additional biopsies negative for carcinoma.  CLINICAL NOTE: Audrey [name] is a 78 year old woman who recently was being evaluated for hypercalcemia. As part of her workup, she had a PET/CT which showed a 3 cm right middle lobe mass that was markedly hypermetabolic. She was advised to undergo surgical resection. The indications, risks, benefits, and alternatives were discussed in detail with the patient. She understood and accepted the risks and agreed to proceed.  OPERATIVE NOTE: Audrey [name] was brought to the operating room on 10/12/2023. She had induction of general anesthesia and was intubated with a double-lumen endotracheal tube. Intravenous antibiotics were administered. A Foley catheter was placed.  Sequential compression devices were placed on  the calves for DVT prophylaxis. She was placed in a left lateral decubitus position. A Bair Hugger was placed for active warming. The right chest was prepped and draped in the usual sterile fashion. Single lung ventilation of the left lung was initiated and was tolerated well throughout the procedure.  A time-out was performed. A solution containing 20 mL of liposomal bupivacaine , 30 mL of 0.5% bupivacaine , and 50 mL of saline was prepared. The solution was used for local at the incision sites as well as for the intercostal nerve blocks. An incision was made in the 8th interspace in the midaxillary line. An 8-mm robotic port was inserted. There was a small superficial tear in the lower lobe with the initial port insertion which was later stapled. The thoracoscope was advanced into the chest. After confirming intrapleural placement, carbon dioxide was insufflated per protocol. A 12-mm robotic port was placed in the eighth interspace anterior to the camera port. Intercostal nerve blocks then were performed from the third to the tenth interspace by injecting 10 mL of bupivacaine  solution into a subpleural plane at each level. A 12-mm AirSeal port was placed in the 10th interspace and then two additional robotic ports were placed in the 8th interspace. The robot was deployed. The camera arm was docked. Targeting was performed. The remaining arms were docked. The robotic instruments were inserted with thoracoscopic visualization.   The lung was retracted superiorly. The dissection was done with bipolar cautery. The inferior ligament was divided.  All lymph nodes that were encountered were sent for permanent pathology with the exception of the level 10 and level 12 nodes that were sent for frozen section, both of which showed no tumor. The pleural reflection was divided at the hilum posteriorly. Level 8 and level 7 nodes were removed. There was one small level  7 node and then a larger 7 node. There was some minor  bleeding, which was controlled with cautery and Surgicel. The space between the right upper lobe bronchus and the bronchus intermedius was explored, but no level 11 node was identified. The lung was retracted inferiorly. There were two level 10 nodes in this area which were removed and then the pleura was divided over the mediastinum superior to the azygous vein and a level 4R node was removed. The fissure then was inspected. The mass was evident centrally in the right middle lobe. There was some associated atelectasis. The pleura overlying the pulmonary artery was incised. There was a large level 12 node and this was dissected out, but it took quite a bit of time and portions of the dissection were done intermittently with other maneuvers. The major fissure was stapled to take the fissure beyond the confluence of the two fissures. Anteriorly while dissecting around the middle lobe vein, there was some bleeding that was controlled and then the middle lobe vein was encircled and stapled. There were multiple middle lobe vein branches and two of the three were stapled at this point and then the  other was stapled later. The remaining portion of the fissure between the lower and middle lobes was divided with the robotic stapler. At this point, the decision was made to begin taking down the minor fissure. There were some adhesions of the middle lobe to the minor fissure, which were divided with the stapler and then a nodule was notable in the right upper lobe along its inferior margin. This nodule was removed with a wedge resection with sequential firings of the robotic stapler. It was sent for frozen section. It returned showing a necrotizing mass. There was no definite carcinoma and no granulomas. AFB and fungal cultures were sent by pathology. The stapling of the fissure then was begun. The minor fissure was incomplete and this was a slow and tedious process. Ultimately, the level 12 node adjacent to the middle lobe  artery was completely removed and then the middle lobe bronchus was able to be encircled. The stapler was placed across the bronchus and closed. The test inflation showed aeration of the lower and upper lobes. The stapler was fired transecting the middle lobe bronchus. Then working inferiorly to superiorly, it was clear that the mass was in close proximity to the origin of the middle lobe arterial branches. These were encircled. The stapler was placed at the origin of the branches onto the main pulmonary artery, closed and fired. The lobectomy was complete. The middle lobe was placed into an endoscopic retrieval bag. The chest was copiously irrigated. Test inflation to 30 cm of water  revealed no air leakage from the bronchial stump or the staple lines. The sponges and vessel loop used during the procedure were removed. The robotic instruments were removed. The robot was undocked. The anterior eighth interspace incision was lengthened to approximately 3 cm and the specimen was removed. It was sent for frozen section of the mass as well as the bronchial and vascular margins. Inspection was made for hemostasis. A 28-French chest tube was placed through the original port  incision and secured with #1 silk suture. Dual lung ventilation was resumed. Frozen section initially returned with the mass showing non-small cell carcinoma. Bronchial and vascular margins were reported negative and wound closure was begun.  Shortly thereafter, a call was received from Pathology that there was a tumor in the perivascular tissue. The wounds were reopened. The robotic  ports were reinserted. The AirSeal port was reinserted. The robot was re-docked and the robotic instruments were reinserted. The staple line was inspected. It was clear there was no way to take additional vascular margin without at least a bilobectomy or possibly even a pneumonectomy. Tissue was taken from six sites along the vascular staple lines and sent for frozen  section. The frozen section revealed two atypical cells at one of the biopsies, but the remainder were negative. It was not felt given the patient's age that bilobectomy would be appropriate under these circumstances, nor was it clear this would change the margin status. Clips were placed on the most concerning areas in case radiation was necessary later. Again, a final inspection was made for hemostasis. The robotic instruments were removed. The robot was undocked. The chest tube was reinserted. Dual lung ventilation was resumed and the wounds were closed in standard fashion. The chest tube was placed to Pleur-evac on water  seal. The patient was placed back in the supine position. She was extubated in the operating room and taken to the post-anesthesia care unit in good condition. All sponge, needle, and instrument counts were correct at the end of the procedure.    SUJ D: 10/12/2023 6:18:55 pm T: 10/12/2023 9:49:00 pm  JOB: [id number]/ [id number]

## 2023-10-13 ENCOUNTER — Inpatient Hospital Stay (HOSPITAL_COMMUNITY): Payer: Medicare PPO

## 2023-10-13 ENCOUNTER — Encounter (HOSPITAL_COMMUNITY): Payer: Self-pay | Admitting: Thoracic Surgery (Cardiothoracic Vascular Surgery)

## 2023-10-13 LAB — BASIC METABOLIC PANEL
Anion gap: 9 (ref 5–15)
BUN: 10 mg/dL (ref 8–23)
CO2: 22 mmol/L (ref 22–32)
Calcium: 9.9 mg/dL (ref 8.9–10.3)
Chloride: 107 mmol/L (ref 98–111)
Creatinine, Ser: 0.66 mg/dL (ref 0.44–1.00)
GFR, Estimated: >60 mL/min (ref >60)
Glucose, Bld: 133 mg/dL — ABNORMAL HIGH (ref 70–99)
Potassium: 4.3 mmol/L (ref 3.5–5.1)
Sodium: 138 mmol/L (ref 135–145)

## 2023-10-13 LAB — CBC
HCT: 33.9 % — ABNORMAL LOW (ref 36.0–46.0)
Hemoglobin: 11.4 g/dL — ABNORMAL LOW (ref 12.0–15.0)
MCH: 31.1 pg (ref 26.0–34.0)
MCHC: 33.6 g/dL (ref 30.0–36.0)
MCV: 92.4 fL (ref 80.0–100.0)
Platelets: 226 10*3/uL (ref 150–400)
RBC: 3.67 MIL/uL — ABNORMAL LOW (ref 3.87–5.11)
RDW: 13.2 % (ref 11.5–15.5)
WBC: 11.9 10*3/uL — ABNORMAL HIGH (ref 4.0–10.5)
nRBC: 0 % (ref 0.0–0.2)

## 2023-10-13 MED ORDER — ARTIFICIAL TEARS OPHTHALMIC OINT
TOPICAL_OINTMENT | Freq: Three times a day (TID) | OPHTHALMIC | Status: DC
Start: 2023-10-13 — End: 2023-10-16
  Administered 2023-10-14: 1 via OPHTHALMIC
  Filled 2023-10-13: qty 3.5

## 2023-10-13 MED ORDER — POLYMYXIN B-TRIMETHOPRIM 10000-0.1 UNIT/ML-% OP SOLN
1.0000 [drp] | Freq: Four times a day (QID) | OPHTHALMIC | Status: AC
  Administered 2023-10-13 – 2023-10-14 (×2): 1 [drp] via OPHTHALMIC
  Filled 2023-10-13: qty 10

## 2023-10-13 MED ORDER — POLYMYXIN B-TRIMETHOPRIM 10000-0.1 UNIT/ML-% OP SOLN
1.0000 [drp] | Freq: Four times a day (QID) | OPHTHALMIC | Status: DC
  Administered 2023-10-13 – 2023-10-15 (×7): 1 [drp] via OPHTHALMIC
  Filled 2023-10-13: qty 10

## 2023-10-13 MED ORDER — CALCIUM CARBONATE ANTACID 500 MG PO CHEW
1.0000 | CHEWABLE_TABLET | Freq: Three times a day (TID) | ORAL | Status: DC
  Administered 2023-10-13 – 2023-10-15 (×5): 200 mg via ORAL
  Filled 2023-10-13 (×8): qty 1

## 2023-10-13 MED ORDER — CHLORHEXIDINE GLUCONATE CLOTH 2 % EX PADS
6.0000 | MEDICATED_PAD | Freq: Every day | CUTANEOUS | Status: DC
  Administered 2023-10-12 – 2023-10-14 (×3): 6 via TOPICAL

## 2023-10-13 MED ORDER — KETOROLAC TROMETHAMINE 0.5 % OP SOLN
1.0000 [drp] | Freq: Four times a day (QID) | OPHTHALMIC | Status: DC | PRN
  Administered 2023-10-13: 1 [drp] via OPHTHALMIC

## 2023-10-13 MED ORDER — BSS IO SOLN
15.0000 mL | Freq: Once | INTRAOCULAR | Status: AC
  Administered 2023-10-13: 15 mL
  Filled 2023-10-13: qty 15

## 2023-10-13 NOTE — Progress Notes (Addendum)
 Patient has been complaining of left eye pain since immediately post surgery, beginning in PACU. Eye drops were given (in PACU) and Liquifilm eye drops ordered. Patient says both seemed to make it worse. She said it is burning, rates the pain a 6/10, and says she feels like something is in her eye.   We discussed the possibility of a corneal abrasion and talking further about this in the morning with MD. We tried a moist rag for her to hold over eye and that hasn't helped much. Ice pack is being used now.   Patient has also been complaining of throat pain. Voice is hoarse. She requested warm tea.   She says she has 0 pain from the surgical site.

## 2023-10-13 NOTE — Progress Notes (Signed)
 Mobility Specialist Progress Note;   10/13/23 1125  Mobility  Activity Transferred from bed to chair  Level of Assistance Contact guard assist, steadying assist  Assistive Device Front wheel walker  Distance Ambulated (ft) 15 ft  Activity Response Tolerated well  Mobility Referral Yes  Mobility visit 1 Mobility  Mobility Specialist Start Time (ACUTE ONLY) 1125  Mobility Specialist Stop Time (ACUTE ONLY) 1140  Mobility Specialist Time Calculation (min) (ACUTE ONLY) 15 min   RN asking assistance transferring pt from bed to chair. Required MinG assistance during ambulation for safety. Ambulated in room on RA, VSS. Pt left comfortably in chair with all needs met, eating lunch.   Lauraine Erm Mobility Specialist Please contact via SecureChat or [employer] [phone]

## 2023-10-13 NOTE — Discharge Summary (Addendum)
 Physician Discharge Summary  Patient ID: JOEANN STEPPE MRN: [id number] DOB/AGE: 01/19/1946 78 y.o.  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, N0)  Patient Active Problem List   Diagnosis Date Noted   Lung mass 10/12/2023   S/P lobectomy of lung 10/12/2023   Acute left-sided low back pain without sciatica 08/06/2018   Anxiety 07/11/2017   Hypothyroidism 02/07/2015   Arthralgia of multiple joints 05/28/2014   Vitamin D  deficiency 05/09/2012   Osteopenia 05/26/2010   Hypersomnia 02/22/2010   Hyperlipidemia 01/21/2010   Diverticulosis of large intestine 01/21/2010   Spondylosis, lumbar, 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  She is being evaluated for hypercalcemia.  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  Bone marrow biopsy was negative for myeloma.  As part of her evaluation she had a PET.  There was no evidence of myeloma, but there was a 3 cm right middle lobe mass that was markedly hypermetabolic. She is a lifelong non-smoker.  Her parents  were both smokers but neither have lung cancer.  She is fairly active.  Her walking is somewhat limited by balance issues, but she can walk up 2 flights of stairs without stopping because of her breathing.  No chest pain, pressure, tightness, or shortness of breath.  No change in appetite or weight loss.  No unusual headaches or visual changes.  She was evaluated by Dr. Kerrin who recommended MRI of the brain and Robotic Assisted Lung Resection.  The risks and benefits of the procedure were explained to the patient and she was agreeable to proceed.  Hospital Course:  Alfreda Hammad presented to [facility] on 10/12/2023.  She underwent Robotic Assisted Video Thoracoscopy with Right Middle Lobectomy, Intercostal Nerve Block, and Lymph Node Sampling.  She tolerated the procedure without difficulty, was extubated, and taken to the PACU in stable condition.  The patient complained of left eye pain/irritation.  It was felt she likely had a corneal abrasion post anesthesia.  He was treated with ophthalmic tears and polytrim  ointment.  Her chest tube output was very low.  She did not have an air leak from her chest tube.  Chest xray did not have a significant pneumothorax.  Her chest tube was removed without difficulty on 10/13/2023.  Follow up chest xray showed unchanged, small right apical pneumothorax.  The patient has been resumed on home medications.  She remains in NSR.  She is ambulating without difficulty.  PT consult was performed and they recommended continuance of PT as outpatient.  Her surgical incisions are clean, dry and healing without signs of infection. She does have some sero sanguinous drainage from right chest  tube wound, gauze dressing was recommended as needed. There is no sign of infection. She has been tolerating a diet. She is progressing with ambulation. Home PT has been arranged. She is stable for discharge home today.      Consults: None  Significant Diagnostic Studies:  nuclear medicine:   FINDINGS: Mediastinal blood pool activity: SUV max 2.9   HEAD/NECK:   No abnormal hypermetabolism.   Incidental CT findings:   None.   CHEST:   Hypermetabolic perihilar right middle lobe nodule measures 2.4 x 2.8 cm (7/32), SUV max 8.4. Uniform thyroid  hypermetabolism. No additional abnormal hypermetabolism.   Incidental CT findings:   Atherosclerotic calcification of the aorta. Heart is enlarged. No pericardial or pleural effusion.   ABDOMEN/PELVIS:   No abnormal hypermetabolism.   Incidental CT findings:   Gallstones. Small low-attenuation lesion in the right kidney. Moderate to large hiatal hernia. Probable fibroid uterus.   SKELETON:   No abnormal hypermetabolism.   Incidental CT findings:   Degenerative changes in the spine.   EXTREMITIES:   No abnormal hypermetabolism.   Incidental CT findings:   None.   IMPRESSION: 1. No evidence of FDG avid multiple myeloma. 2. Hypermetabolic right middle lobe nodule, indicative of primary bronchogenic carcinoma. These results will be called to the ordering clinician or representative by the Radiologist Assistant, and communication documented in the PACS or [employer]. 3. Uniform thyroid  hypermetabolism. Consider laboratory correlation. 4. Cholelithiasis. 5. Moderate to large hiatal hernia. 6. Fibroid uterus. 7.  Aortic atherosclerosis (ICD10-I70.0).     Electronically Signed   By: Newell Eke M.D.   On: 09/08/2023 14:48  Treatments: surgery:   NAME: AMARRIA, ANDREASEN MEDICAL RECORD NO: [id number] ACCOUNT NO: [id number] DATE OF BIRTH: 11-01-1945 FACILITY: [facility] LOCATION: [facility] PHYSICIAN: Elspeth BROCKS. Kerrin, MD   Operative Report    DATE OF PROCEDURE: 10/12/2023   PREOPERATIVE DIAGNOSIS: Right middle lobe lung mass.   POSTOPERATIVE DIAGNOSES: 1. Non-small cell carcinoma, right middle lobe. 2. Necrotizing nodule, right upper lobe.   PROCEDURE: Xi  robotic-assisted right upper lobe wedge resection, right middle lobectomy, lymph node dissection, and intercostal nerve blocks levels 3 through 10.   SURGEON: Elspeth BROCKS. Kerrin, MD   ASSISTANT: Rocky Shad, PA  PATHOLOGY: Pending  Discharge Exam: Blood pressure 92/60, pulse 70, temperature 98.3 F (36.8 C), temperature source Oral, resp. rate 19, height 5' 2 (1.575 m), weight 68 kg, SpO2 94%. Cardiovascular: RRR, no murmur Pulmonary: Clear to auscultation on left and somewhat coarse breath sounds on the right. Abdomen: Soft, non tender, bowel sounds present. Extremities: No edema Wounds: Clean and dry. No erythema or signs of infection.   Disposition:  Discharge disposition: 06-Home-Health Care Svc        Allergies as of 10/16/2023       Reactions   Celecoxib Swelling   face   Sulfonamide Derivatives Swelling   face        Medication List     TAKE these medications    acetaminophen  500 MG tablet Commonly known as: TYLENOL  Take 1-2 tablets (500-1,000 mg total) by mouth every 6 (six) hours as needed.   atorvastatin  40 MG tablet Commonly known as: LIPITOR Take 40 mg by mouth daily. What changed: Another medication with the same name was removed. Continue taking this medication, and follow the directions you see here.   FLUoxetine  20 MG capsule Commonly known as: PROZAC  Take 20 mg by mouth daily.   gabapentin  300 MG capsule Commonly known as: Neurontin  Take 1  capsule (300 mg total) by mouth at bedtime.   guaiFENesin  600 MG 12 hr tablet Commonly known as: MUCINEX  Take 1 tablet (600 mg total) by mouth 2 (two) times daily as needed for to loosen phlegm or cough.   levothyroxine  25 MCG tablet Commonly known as: SYNTHROID  TAKE 1 TABLET BY MOUTH BEFORE BREAKFAST What changed:  how much to take how to take this when to take this additional instructions   naproxen sodium 220 MG tablet Commonly known as: ALEVE Take 220 mg by mouth daily as needed  (pain).   nystatin -triamcinolone cream Commonly known as: MYCOLOG II Apply 1 application topically daily as needed (Vaginal itcing).   oxyCODONE  5 MG immediate release tablet Commonly known as: Oxy IR/ROXICODONE  Take 1 tablet (5 mg total) by mouth every 6 (six) hours as needed for moderate pain (pain score 4-6).   pantoprazole  40 MG tablet Commonly known as: PROTONIX  Take 40 mg by mouth daily.   PreserVision AREDS Caps Take 1 capsule by mouth in the morning and at bedtime.   Systane Ultra 0.4-0.3 % Soln Generic drug: Polyethyl Glycol-Propyl Glycol Place 1 drop into both eyes in the morning and at bedtime.   trimethoprim -polymyxin b  ophthalmic solution Commonly known as: POLYTRIM  Place 1 drop into both eyes every 6 (six) hours. X 3 days   Vitamin D  50 MCG (2000 UT) tablet Take 2,000 Units by mouth daily.        Follow-up Information     Kerrin Elspeth BROCKS, MD Follow up on 10/31/2023.   Specialty: Cardiothoracic Surgery Why: Appointment is at 9:15, please get CXR 1 hour prior to your appointment Contact information: [address] [place] [address] [phone]         [facility] Follow up on 10/31/2023.   Why: Please get CXR at 8:15 prior to your appointment with Dr. Chrystal Pass information: [address] [place]  [phone]        [facility]. Go on 10/26/2023.   Specialty: Cardiothoracic Surgery Why: Appointment is with nurse only for chest tube suture removal. Office will call with appointment time on Monday Contact information: [address] [place], [address] [place]  [address] [phone]        [facility] Follow up.   Specialty: Home Health Services Why: Home health has been arranged. They will contact you to scheduel apt within 48 hrs post discharge. Contact information: [address] [place]  [address] [phone]                 Signed: Con BROCKS Helm, PA-C 10/16/2023, 10:15 AM

## 2023-10-13 NOTE — Progress Notes (Addendum)
      [address]       Audrey [name] [address]             [phone]      1 Day Post-Op Procedure(s) (LRB): XI ROBOTIC ASSISTED THORACOSCOPY- RIGHT MIDDLE LOBECTOMY (Right) INTERCOSTAL NERVE BLOCK (Right) NODE DISSECTION (Right)  Subjective:  Patient complains of eye discomfort.  Also asking for some TUMS.. Denies Pain other than some mild shoulder discomfort.  Objective: Vital signs in last 24 hours: Temp:  [97.5 F (36.4 C)-98.1 F (36.7 C)] 98 F (36.7 C) (02/07 0246) Pulse Rate:  [65-85] 69 (02/07 0700) Cardiac Rhythm: Normal sinus rhythm (02/07 0716) Resp:  [13-22] 16 (02/07 0700) BP: (102-147)/(49-99) 133/68 (02/07 0700) SpO2:  [95 %-98 %] 97 % (02/07 0700) Weight:  [68 kg] 68 kg (02/06 1017)  Intake/Output from previous day: 02/06 0701 - 02/07 0700 In: 2856.7 [P.O.:480; I.V.:1826.7; IV Piggyback:550] Out: 2360 [Urine:2300; Blood:50; Chest Tube:10]  General appearance: alert, cooperative, and no distress Heart: regular rate and rhythm Lungs: clear to auscultation bilaterally Abdomen: soft, non-tender; bowel sounds normal; no masses,  no organomegaly Extremities: extremities normal, atraumatic, no cyanosis or edema Wound: clean and dry  Lab Results: Recent Labs    10/10/23 1130 10/13/23 0210  WBC 5.9 11.9*  HGB 12.8 11.4*  HCT 40.1 33.9*  PLT 280 226   BMET:  Recent Labs    10/10/23 1130 10/13/23 0210  NA 141 138  K 4.3 4.3  CL 107 107  CO2 26 22  GLUCOSE 101* 133*  BUN 11 10  CREATININE 0.65 0.66  CALCIUM  10.6* 9.9    PT/INR:  Recent Labs    10/10/23 1130  LABPROT 13.7  INR 1.0   ABG No results found for: PHART, HCO3, TCO2, ACIDBASEDEF, O2SAT CBG (last 3)  No results for input(s): GLUCAP in the last 72 hours.  Assessment/Plan: S/P Procedure(s) (LRB): XI ROBOTIC ASSISTED THORACOSCOPY- RIGHT MIDDLE LOBECTOMY (Right) INTERCOSTAL NERVE BLOCK (Right) NODE DISSECTION (Right)  CV- NSR, BP stable Pulm- CT  on water  seal, no air leak present... CXR w/o pneumothorax.. there is no CT output.SABRA after patient gets up and ambulates if no significant output can likely d/c chest tube today Corneal Abrasion- started Liquid tears, Polytrim - will notify anesthesia GI- some indigestion requesting tums Lovenox  for DVT prophylaxis Dispo- patient stable, will check on her after she ambulates if output remains low will d/c chest tube today.   LOS: 1 day    Rocky Shad, PA-C 10/13/2023 Patient seen and examined, agree with above Looks great, no air leak and essentially no drainage Dc chest tube [name] C. Kerrin, MD [facility] [phone]'

## 2023-10-13 NOTE — Evaluation (Addendum)
 Physical Therapy Evaluation Patient Details Name: Audrey [name] MRN: [id number] DOB: Jul 31, 1946 Today's Date: 10/13/2023  History of Present Illness  78 yo female admitted 2/6 for Rt middle lobectomy due to NSCLC. PMHx:HLD, hypersomnia, anxiety, skin CA  Clinical Impression  Pt pleasant and eager to mobilize stating independence at home. Pt with impaired balance veering in hall and LOB on stairs. Pt educated for RW use and instructed to use at all times with gait, pt also impulsive at times needing cues for safety and controlled speed. Pt with SPO2 87-93% on RA with gait with SPO2 recovery with standing  rest and pursed lip breathing with pt educated for benefit of pulse ox for home use. Pt with decreased balance, activity tolerance and function who will benefit from acute therapy to maximize mobility, independence and decrease fall risk.  Pt would benefit from OPPT balance training.       If plan is discharge home, recommend the following: A little help with walking and/or transfers;A little help with bathing/dressing/bathroom   Can travel by private vehicle        Equipment Recommendations Other (comment) (pt reports RW at home, needs pulse ox)  Recommendations for Other Services       Functional Status Assessment Patient has had a recent decline in their functional status and demonstrates the ability to make significant improvements in function in a reasonable and predictable amount of time.     Precautions / Restrictions Precautions Precautions: Fall;Other (comment) Precaution Comments: chest tube, watch sats      Mobility  Bed Mobility Overal bed mobility: Modified Independent             General bed mobility comments: pt able to transfer supine to sit without assist    Transfers Overall transfer level: Modified independent                      Ambulation/Gait Ambulation/Gait assistance: Contact guard assist Gait Distance (Feet): 400  Feet Assistive device: Rolling walker (2 wheels), None Gait Pattern/deviations: Step-through pattern, Decreased stride length   Gait velocity interpretation: 1.31 - 2.62 ft/sec, indicative of limited community ambulator   General Gait Details: pt initiated gait without RW with pt having significant veer to the right with assist to maintain balance, after 40' returned to room to obtain RW and used it for 400'. pt with need for cues for posture and proximity to RW with continued veering with RW but improved. pt at times rushing with gait with cues for safety and decreased speed  Stairs Stairs: Yes Stairs assistance: Contact guard assist Stair Management: Alternating pattern, Forwards Number of Stairs: 9 General stair comments: pt initially rushing on stairs despite cues with one partial LOB anteriorly with min assist to recover and use of rail throughout  Wheelchair Mobility     Tilt Bed    Modified Rankin (Stroke Patients Only)       Balance Overall balance assessment: Needs assistance Sitting-balance support: No upper extremity supported, Feet supported Sitting balance-Leahy Scale: Good       Standing balance-Leahy Scale: Good Standing balance comment: pt can static stand without assist but veering and LOB without UB support with walking                             Pertinent Vitals/Pain Pain Assessment Pain Assessment: No/denies pain    Home Living Family/patient expects to be discharged to:: Private residence Living Arrangements: Spouse/significant  other Available Help at Discharge: Family;Available 24 hours/day Type of Home: House Home Access: Stairs to enter   Entrance Stairs-Number of Steps: 1 Alternate Level Stairs-Number of Steps: 14 Home Layout: Two level;Able to live on main level with bedroom/bathroom Home Equipment: Rolling Walker (2 wheels) Additional Comments: bed and walk in shower on main level but pt goes to 2nd floor daily for tub bath     Prior Function Prior Level of Function : Independent/Modified Independent;Driving                     Extremity/Trunk Assessment   Upper Extremity Assessment Upper Extremity Assessment: Overall WFL for tasks assessed    Lower Extremity Assessment Lower Extremity Assessment: Overall WFL for tasks assessed    Cervical / Trunk Assessment Cervical / Trunk Assessment: Normal  Communication   Communication Communication: Hearing impairment Cueing Techniques: Verbal cues  Cognition Arousal: Alert Behavior During Therapy: WFL for tasks assessed/performed Overall Cognitive Status: Within Functional Limits for tasks assessed                                          General Comments      Exercises     Assessment/Plan    PT Assessment Patient needs continued PT services  PT Problem List Decreased activity tolerance;Decreased balance;Decreased mobility;Decreased safety awareness       PT Treatment Interventions DME instruction;Gait training;Stair training;Functional mobility training;Patient/family education;Therapeutic activities;Therapeutic exercise;Balance training    PT Goals (Current goals can be found in the Care Plan section)  Acute Rehab PT Goals Patient Stated Goal: go home PT Goal Formulation: With patient Time For Goal Achievement: 10/27/23 Potential to Achieve Goals: Good    Frequency Min 1X/week     Co-evaluation               AM-PAC PT 6 Clicks Mobility  Outcome Measure Help needed turning from your back to your side while in a flat bed without using bedrails?: None Help needed moving from lying on your back to sitting on the side of a flat bed without using bedrails?: None Help needed moving to and from a bed to a chair (including a wheelchair)?: A Little Help needed standing up from a chair using your arms (e.g., wheelchair or bedside chair)?: A Little Help needed to walk in hospital room?: A Little Help needed climbing  3-5 steps with a railing? : A Little 6 Click Score: 20    End of Session   Activity Tolerance: Patient tolerated treatment well Patient left: in bed;with call bell/phone within reach (for chest tube removal) Nurse Communication: Mobility status PT Visit Diagnosis: Other abnormalities of gait and mobility (R26.89)    Time: 8774-8750 PT Time Calculation (min) (ACUTE ONLY): 24 min   Charges:   PT Evaluation $PT Eval Moderate Complexity: 1 Mod   PT General Charges $$ ACUTE PT VISIT: 1 Visit         Lenoard SQUIBB, PT Acute Rehabilitation Services Office: [phone]   Lenoard NOVAK Finnis Colee 10/13/2023, 1:28 PM

## 2023-10-13 NOTE — Discharge Instructions (Addendum)
 Discharge Instructions:  1. You may shower, please wash incisions daily with soap and water  and keep dry.  If you wish to cover wounds with dressing you may do so but please keep clean and change daily.  No tub baths or swimming until incisions have completely healed.  Please apply  dry 4x4s and tape to right chest tube wound. Change daily and PRN. Once drainage stops, you may let wound open to air. If your incisions become red or develop any drainage please call our office at [phone]  2. No Driving until cleared by Dr. Chrystal office and you are no longer using narcotic pain medications  3. Fever of 101.5 for at least 24 hours with no source, please contact our office at [phone]  4. Activity- up as tolerated, please walk at least 3 times per day.  Avoid strenuous activity, no lifting, pushing, or pulling with your arms over 8-10 lbs for a minimum of 6 weeks  5. If any questions or concerns arise, please do not hesitate to contact our office at [phone]

## 2023-10-13 NOTE — Anesthesia Postprocedure Evaluation (Signed)
 Anesthesia Post Note  Patient: Audrey [name]  Procedure(s) Performed: XI ROBOTIC ASSISTED THORACOSCOPY- RIGHT MIDDLE LOBECTOMY (Right: Chest) INTERCOSTAL NERVE BLOCK (Right: Chest) NODE DISSECTION (Right: Chest)     Patient location during evaluation: PACU Anesthesia Type: General Level of consciousness: awake and alert Pain management: pain level controlled Vital Signs Assessment: post-procedure vital signs reviewed and stable Respiratory status: spontaneous breathing, nonlabored ventilation, respiratory function stable and patient connected to nasal cannula oxygen Cardiovascular status: blood pressure returned to baseline and stable Postop Assessment: no apparent nausea or vomiting Anesthetic complications: no   No notable events documented.  Last Vitals:  Vitals:   10/13/23 0827 10/13/23 0900  BP: 104/67 (!) 110/57  Pulse: 69 71  Resp: 17 17  Temp: 37.1 C   SpO2: 97% 96%    Last Pain:  Vitals:   10/13/23 0827  TempSrc: Oral  PainSc:                  [name]

## 2023-10-14 ENCOUNTER — Inpatient Hospital Stay (HOSPITAL_COMMUNITY): Payer: Medicare PPO

## 2023-10-14 LAB — COMPREHENSIVE METABOLIC PANEL
ALT: 12 U/L (ref 0–44)
AST: 26 U/L (ref 15–41)
Albumin: 2.9 g/dL — ABNORMAL LOW (ref 3.5–5.0)
Alkaline Phosphatase: 60 U/L (ref 38–126)
Anion gap: 8 (ref 5–15)
BUN: 14 mg/dL (ref 8–23)
CO2: 22 mmol/L (ref 22–32)
Calcium: 9.6 mg/dL (ref 8.9–10.3)
Chloride: 108 mmol/L (ref 98–111)
Creatinine, Ser: 0.67 mg/dL (ref 0.44–1.00)
GFR, Estimated: >60 mL/min (ref >60)
Glucose, Bld: 103 mg/dL — ABNORMAL HIGH (ref 70–99)
Potassium: 4.1 mmol/L (ref 3.5–5.1)
Sodium: 138 mmol/L (ref 135–145)
Total Bilirubin: 0.6 mg/dL (ref 0.0–1.2)
Total Protein: 5.5 g/dL — ABNORMAL LOW (ref 6.5–8.1)

## 2023-10-14 LAB — CBC
HCT: 32.3 % — ABNORMAL LOW (ref 36.0–46.0)
Hemoglobin: 10.5 g/dL — ABNORMAL LOW (ref 12.0–15.0)
MCH: 30.7 pg (ref 26.0–34.0)
MCHC: 32.5 g/dL (ref 30.0–36.0)
MCV: 94.4 fL (ref 80.0–100.0)
Platelets: 208 10*3/uL (ref 150–400)
RBC: 3.42 MIL/uL — ABNORMAL LOW (ref 3.87–5.11)
RDW: 13.7 % (ref 11.5–15.5)
WBC: 9.3 10*3/uL (ref 4.0–10.5)
nRBC: 0 % (ref 0.0–0.2)

## 2023-10-14 MED ORDER — GABAPENTIN 300 MG PO CAPS: 300.0000 mg | ORAL_CAPSULE | Freq: Every day | ORAL | 0 refills | Status: DC

## 2023-10-14 MED ORDER — OXYCODONE HCL 5 MG PO TABS: 5.0000 mg | ORAL_TABLET | Freq: Four times a day (QID) | ORAL | 0 refills | Status: DC | PRN

## 2023-10-14 MED ORDER — POLYMYXIN B-TRIMETHOPRIM 10000-0.1 UNIT/ML-% OP SOLN: 1.0000 [drp] | Freq: Four times a day (QID) | OPHTHALMIC | 0 refills | Status: DC

## 2023-10-14 MED ORDER — ACETAMINOPHEN 500 MG PO TABS: 500.0000 mg | ORAL_TABLET | Freq: Four times a day (QID) | ORAL | Status: AC | PRN

## 2023-10-14 NOTE — Plan of Care (Signed)
  Problem: Pain Managment: Goal: General experience of comfort will improve and/or be controlled Outcome: Progressing   Problem: Skin Integrity: Goal: Risk for impaired skin integrity will decrease Outcome: Progressing   Problem: Activity: Goal: Risk for activity intolerance will decrease Outcome: Progressing   Problem: Clinical Measurements: Goal: Postoperative complications will be avoided or minimized Outcome: Progressing   Problem: Respiratory: Goal: Respiratory status will improve Outcome: Progressing

## 2023-10-14 NOTE — Plan of Care (Signed)
   Problem: Education: Goal: Knowledge of General Education information will improve Description Including pain rating scale, medication(s)/side effects and non-pharmacologic comfort measures Outcome: Progressing   Problem: Health Behavior/Discharge Planning: Goal: Ability to manage health-related needs will improve Outcome: Progressing

## 2023-10-14 NOTE — Progress Notes (Addendum)
      [address]       [place] [address]             [phone]       2 Days Post-Op Procedure(s) (LRB): XI ROBOTIC ASSISTED THORACOSCOPY- RIGHT MIDDLE LOBECTOMY (Right) INTERCOSTAL NERVE BLOCK (Right) NODE DISSECTION (Right)  Subjective: Patient with no specific complaint this am.  Objective: Vital signs in last 24 hours: Temp:  [97.8 F (36.6 C)-98.8 F (37.1 C)] 98.3 F (36.8 C) (02/08 0730) Pulse Rate:  [67-78] 73 (02/08 0441) Cardiac Rhythm: Normal sinus rhythm (02/08 0700) Resp:  [16-20] 18 (02/08 0730) BP: (100-116)/(57-79) 111/79 (02/08 0730) SpO2:  [91 %-97 %] 97 % (02/08 0730)     Intake/Output from previous day: 02/07 0701 - 02/08 0700 In: 601.9 [P.O.:600; I.V.:1.9] Out: 1120 [Urine:1100; Chest Tube:20]   Physical Exam:  Cardiovascular: RRR Pulmonary: Clear to auscultation on left and somewhat coarse on the right. Abdomen: Soft, non tender, bowel sounds present. Extremities: Mild bilateral lower extremity edema. Wounds: Clean and dry.  No erythema or signs of infection. There is sero sanguinous drainage from right chest tube wound.   Lab Results: CBC: Recent Labs    10/13/23 0210 10/14/23 0209  WBC 11.9* 9.3  HGB 11.4* 10.5*  HCT 33.9* 32.3*  PLT 226 208   BMET:  Recent Labs    10/13/23 0210 10/14/23 0209  NA 138 138  K 4.3 4.1  CL 107 108  CO2 22 22  GLUCOSE 133* 103*  BUN 10 14  CREATININE 0.66 0.67  CALCIUM  9.9 9.6    PT/INR: No results for input(s): LABPROT, INR in the last 72 hours. ABG:  INR: Will add last result for INR, ABG once components are confirmed Will add last 4 CBG results once components are confirmed  Assessment/Plan:  1. CV - SR. 2.  Pulmonary - On room air. Chest tube removed yesterday. CXR this am appears to show trace right apical pneumothorax, minor subcutaneous emphysema right lateral chest wall. I instructed patient to apply dry gauze and tape to right chest tube wound. Change  daily and PRN. Once drainage stops, she may let open to air. Encourage incentive spirometer. Await final pathology. 3. On Lovenox  for DVT prophylaxis 4. Expected post op blood loss anemia-H and H this am 10.5 and 32.3 5. Home PT ordered 6. Discharge once ambulation improves  Donielle M ZimmermanPA-C 10/14/2023,8:22 AM   Chart reviewed, patient examined, agree with above.  CXR stable. Continue IS, ambulation and home when moving around more.

## 2023-10-14 NOTE — Progress Notes (Signed)
 Mobility Specialist Progress Note:   10/14/23 0935  Mobility  Activity Ambulated with assistance in hallway  Level of Assistance Contact guard assist, steadying assist  Assistive Device None  Distance Ambulated (ft) 150 ft  Activity Response Tolerated well  Mobility Referral Yes  Mobility visit 1 Mobility  Mobility Specialist Start Time (ACUTE ONLY) 0935  Mobility Specialist Stop Time (ACUTE ONLY) 0950  Mobility Specialist Time Calculation (min) (ACUTE ONLY) 15 min   Pt agreeable to mobility session with minor encouragement. Required contact assist for steadying. No c/o throughout. Pt back in bed with all needs met.   Therisa Rana Mobility Specialist Please contact via SecureChat or  Rehab office at [phone]

## 2023-10-15 ENCOUNTER — Inpatient Hospital Stay (HOSPITAL_COMMUNITY): Payer: Medicare PPO

## 2023-10-15 NOTE — Plan of Care (Signed)

## 2023-10-15 NOTE — Plan of Care (Signed)
   Problem: Education: Goal: Knowledge of General Education information will improve Description Including pain rating scale, medication(s)/side effects and non-pharmacologic comfort measures Outcome: Progressing   Problem: Health Behavior/Discharge Planning: Goal: Ability to manage health-related needs will improve Outcome: Progressing

## 2023-10-15 NOTE — Progress Notes (Signed)
 Patient ambulated in halls x1 with RN. VSS.

## 2023-10-15 NOTE — Progress Notes (Addendum)
      [address]       [place] [address]             [phone]       3 Days Post-Op Procedure(s) (LRB): XI ROBOTIC ASSISTED THORACOSCOPY- RIGHT MIDDLE LOBECTOMY (Right) INTERCOSTAL NERVE BLOCK (Right) NODE DISSECTION (Right)  Subjective: Patient with no specific complaint this am.  Objective: Vital signs in last 24 hours: Temp:  [97.9 F (36.6 C)-98.7 F (37.1 C)] 97.9 F (36.6 C) (02/09 0755) Pulse Rate:  [71-77] 71 (02/09 0436) Cardiac Rhythm: Normal sinus rhythm (02/09 0700) Resp:  [13-20] 20 (02/09 0436) BP: (91-125)/(51-67) 101/59 (02/09 0755) SpO2:  [91 %-94 %] 93 % (02/09 0436)     Intake/Output from previous day: 02/08 0701 - 02/09 0700 In: 600 [P.O.:600] Out: -    Physical Exam:  Cardiovascular: RRR Pulmonary: Clear to auscultation on left and somewhat coarse on the right. Abdomen: Soft, non tender, bowel sounds present. Extremities: Mild bilateral lower extremity edema. Wounds: Clean and dry.  No erythema or signs of infection. There is sero sanguinous drainage from right chest tube wound.   Lab Results: CBC: Recent Labs    10/13/23 0210 10/14/23 0209  WBC 11.9* 9.3  HGB 11.4* 10.5*  HCT 33.9* 32.3*  PLT 226 208   BMET:  Recent Labs    10/13/23 0210 10/14/23 0209  NA 138 138  K 4.3 4.1  CL 107 108  CO2 22 22  GLUCOSE 133* 103*  BUN 10 14  CREATININE 0.66 0.67  CALCIUM  9.9 9.6    PT/INR: No results for input(s): LABPROT, INR in the last 72 hours. ABG:  INR: Will add last result for INR, ABG once components are confirmed Will add last 4 CBG results once components are confirmed  Assessment/Plan:  1. CV - SR. 2.  Pulmonary - On room air.  CXR this am appears stable (to show trace right apical pneumothorax, minor subcutaneous emphysema right lateral chest wall). I instructed patient to apply dry gauze and tape to right chest tube wound. Change daily and PRN. Once drainage stops, she may let open to air.  Encourage incentive spirometer. Await final pathology. 3. On Lovenox  for DVT prophylaxis 4. Expected post op blood loss anemia-H and H this am 10.5 and 32.3 5.Disposition- Home PT has been ordered. Discharge once ambulation improves;hopefully, in am  Donielle M ZimmermanPA-C 10/15/2023,8:36 AM    Chart reviewed, patient examined, agree with above.  CXR this am shows unchanged tiny right apical ptx. She feels fine and if she continues to walk around today she can go home in am.

## 2023-10-16 LAB — TYPE AND SCREEN
ABO/RH(D): A POS
Antibody Screen: NEGATIVE
Unit division: 0
Unit division: 0

## 2023-10-16 LAB — BPAM RBC
Blood Product Expiration Date: 202502282359
Blood Product Expiration Date: 202503012359
ISSUE DATE / TIME: 202502031028
Unit Type and Rh: 6200
Unit Type and Rh: 6200

## 2023-10-16 MED ORDER — GUAIFENESIN ER 600 MG PO TB12
600.0000 mg | ORAL_TABLET | Freq: Once | ORAL | Status: AC
  Administered 2023-10-16: 600 mg via ORAL
  Filled 2023-10-16: qty 1

## 2023-10-16 MED ORDER — GUAIFENESIN ER 600 MG PO TB12: 600.0000 mg | ORAL_TABLET | Freq: Two times a day (BID) | ORAL | Status: AC | PRN

## 2023-10-16 NOTE — TOC Transition Note (Signed)
 Transition of Care [facility]) - Discharge Note   Patient Details  Name: Audrey [name] MRN: [id number] Date of Birth: 1946/09/02  [facility]) CM/SW Contact:  Jeani Mill, RN Phone Number: 10/16/2023, 8:48 AM   Clinical Narrative:    Patient stable for discharge.  Patient lives with husband and uses walker.  Patient is agreeable to home health.  This RNCM offered choice for Home  Health, Patient states she has no preference, RNCM made referral to [facility] with Gasper Karst, He is able to take referral.  Husband will transport home.  Address, Phone number and PCP verified.    Final next level of care: Home w Home Health Services Barriers to Discharge: Barriers Resolved   Patient Goals and CMS Choice Patient states their goals for this hospitalization and ongoing recovery are:: return home CMS Medicare.gov Compare Post Acute Care list provided to:: Patient Choice offered to / list presented to : Patient      Discharge Placement               home        Discharge Plan and Services Additional resources added to the After Visit Summary for                            [facility] Arranged: PT HH Agency: [facility] Date [facility] Agency Contacted: 10/16/23 Time HH Agency Contacted: [phone] Representative spoke with at [facility] Agency: Randel Buss  Social Drivers of Health (SDOH) Interventions SDOH Screenings   Food Insecurity: No Food Insecurity (10/14/2023)  Housing: Low Risk  (10/14/2023)  Transportation Needs: No Transportation Needs (10/14/2023)  Utilities: Not At Risk (10/14/2023)  Depression (PHQ2-9): Low Risk  (10/04/2018)  Financial Resource Strain: Low Risk  (07/12/2018)  Physical Activity: Inactive (07/12/2018)  Social Connections: Unknown (10/15/2023)  Stress: No Stress Concern Present (07/12/2018)  Tobacco Use: Low Risk  (10/12/2023)     Readmission Risk Interventions    10/16/2023    8:47 AM  Readmission Risk Prevention Plan  Post Dischage Appt Complete   Medication Screening Complete  Transportation Screening Complete

## 2023-10-16 NOTE — Plan of Care (Signed)
  Problem: Education: Goal: Knowledge of General Education information will improve Description: Including pain rating scale, medication(s)/side effects and non-pharmacologic comfort measures Outcome: Progressing   Problem: Health Behavior/Discharge Planning: Goal: Ability to manage health-related needs will improve Outcome: Progressing   Problem: Clinical Measurements: Goal: Ability to maintain clinical measurements within normal limits will improve Outcome: Progressing Goal: Respiratory complications will improve Outcome: Progressing Goal: Cardiovascular complication will be avoided Outcome: Progressing   Problem: Nutrition: Goal: Adequate nutrition will be maintained Outcome: Progressing   Problem: Pain Managment: Goal: General experience of comfort will improve and/or be controlled Outcome: Progressing   Problem: Safety: Goal: Ability to remain free from injury will improve Outcome: Progressing   Problem: Education: Goal: Knowledge of disease or condition will improve Outcome: Progressing   Problem: Cardiac: Goal: Will achieve and/or maintain hemodynamic stability Outcome: Progressing   Problem: Clinical Measurements: Goal: Postoperative complications will be avoided or minimized Outcome: Progressing   Problem: Respiratory: Goal: Respiratory status will improve Outcome: Progressing   Problem: Pain Management: Goal: Pain level will decrease Outcome: Progressing   Problem: Skin Integrity: Goal: Wound healing without signs and symptoms infection will improve Outcome: Progressing

## 2023-10-16 NOTE — Progress Notes (Addendum)
      [address]       [place] [address]             [phone]      4 Days Post-Op Procedure(s) (LRB): XI ROBOTIC ASSISTED THORACOSCOPY- RIGHT MIDDLE LOBECTOMY (Right) INTERCOSTAL NERVE BLOCK (Right) NODE DISSECTION (Right) Subjective: The patient denies shortness of breath and states she walked more yesterday and is ambulating to the bathroom independently.   Objective: Vital signs in last 24 hours: Temp:  [97.6 F (36.4 C)-98.1 F (36.7 C)] 97.6 F (36.4 C) (02/10 0510) Pulse Rate:  [70-74] 70 (02/10 0510) Cardiac Rhythm: Normal sinus rhythm (02/09 2014) Resp:  [18-19] 19 (02/10 0510) BP: (100-108)/(54-73) 103/66 (02/10 0510) SpO2:  [92 %-94 %] 94 % (02/10 0510)  Hemodynamic parameters for last 24 hours:    Intake/Output from previous day: 02/09 0701 - 02/10 0700 In: 600 [P.O.:600] Out: 2 [Urine:2] Intake/Output this shift: No intake/output data recorded.  General appearance: alert, cooperative, and no distress Neurologic: intact Heart: regular rate and rhythm, S1, S2 normal, no murmur, click, rub or gallop Lungs: Slightly coarse breath sounds on the right Abdomen: soft, non-tender; bowel sounds normal; no masses,  no organomegaly Extremities: extremities normal, atraumatic, no cyanosis or edema Wound: Clean and dry with dressing in place over previous chest tube site. No sign of infection  Lab Results: Recent Labs    10/14/23 0209  WBC 9.3  HGB 10.5*  HCT 32.3*  PLT 208   BMET:  Recent Labs    10/14/23 0209  NA 138  K 4.1  CL 108  CO2 22  GLUCOSE 103*  BUN 14  CREATININE 0.67  CALCIUM  9.6    PT/INR: No results for input(s): "LABPROT", "INR" in the last 72 hours. ABG No results found for: "PHART", "HCO3", "TCO2", "ACIDBASEDEF", "O2SAT" CBG (last 3)  No results for input(s): "GLUCAP" in the last 72 hours.  Assessment/Plan: S/P Procedure(s) (LRB): XI ROBOTIC ASSISTED THORACOSCOPY- RIGHT MIDDLE LOBECTOMY  (Right) INTERCOSTAL NERVE BLOCK (Right) NODE DISSECTION (Right)  Neuro: Pain controlled  CV: NSR, HR 70s. BP controlled.   Pulm: Saturating well on RA. CXR yesterday with stable small right apical pneumothorax and right lateral chest wall subcutaneous emphysema. No new CXR this AM. Dry cough, recommended Mucinex  at home if she feels she needs to loosen phlegm. Encourage IS and ambulation. Pathology pending.   GI: +Bm, tolerating a diet.   Endo: Hypothyroid, continue levothyroxine   Renal: Last Cr 0.67, no new labs this AM.   DVT Prophylaxis: Lovenox   Dispo: Patient ambulated 626ft yesterday. Plan to D/C home with home PT, has a RW at home.    LOS: 4 days    Randa Burton, PA-C 10/16/2023  Patient seen and examined, agree with above Looks great Home today Final path pending  Milon Aloe. Luna Salinas, MD [facility] [phone]

## 2023-10-16 NOTE — Care Management Important Message (Signed)
 Important Message  Patient Details  Name: Audrey [name] MRN: [id number] Date of Birth: Mar 24, 1946   Important Message Given:  Yes - Medicare IM  Patient left prior to IM delivery will mail a copy to the patient home address.   Santino Kinsella 10/16/2023, 3:05 PM

## 2023-10-18 DIAGNOSIS — M1990 Unspecified osteoarthritis, unspecified site: Secondary | ICD-10-CM | POA: Diagnosis not present

## 2023-10-18 DIAGNOSIS — K5730 Diverticulosis of large intestine without perforation or abscess without bleeding: Secondary | ICD-10-CM | POA: Diagnosis not present

## 2023-10-18 DIAGNOSIS — M4716 Other spondylosis with myelopathy, lumbar region: Secondary | ICD-10-CM | POA: Diagnosis not present

## 2023-10-18 DIAGNOSIS — F419 Anxiety disorder, unspecified: Secondary | ICD-10-CM | POA: Diagnosis not present

## 2023-10-18 DIAGNOSIS — T797XXD Traumatic subcutaneous emphysema, subsequent encounter: Secondary | ICD-10-CM | POA: Diagnosis not present

## 2023-10-18 DIAGNOSIS — I10 Essential (primary) hypertension: Secondary | ICD-10-CM | POA: Diagnosis not present

## 2023-10-18 DIAGNOSIS — Z483 Aftercare following surgery for neoplasm: Secondary | ICD-10-CM | POA: Diagnosis not present

## 2023-10-18 DIAGNOSIS — C342 Malignant neoplasm of middle lobe, bronchus or lung: Secondary | ICD-10-CM | POA: Diagnosis not present

## 2023-10-18 DIAGNOSIS — M8580 Other specified disorders of bone density and structure, unspecified site: Secondary | ICD-10-CM | POA: Diagnosis not present

## 2023-10-19 ENCOUNTER — Encounter: Payer: Self-pay | Admitting: Thoracic Surgery (Cardiothoracic Vascular Surgery)

## 2023-10-19 ENCOUNTER — Telehealth: Payer: Self-pay

## 2023-10-19 NOTE — Telephone Encounter (Signed)
Patient contacted the office concerned about a one time instant where she coughed up dark, brown tinged phlegm. She is s/p Lobectomy with Dr. Dorris Fetch 10/12/23. She states that this is the only time this has happened and wanted reassurance it was ok. Advised this is normal after surgery but to contact the office if this continues. She acknowledges receipt.

## 2023-10-20 ENCOUNTER — Other Ambulatory Visit: Payer: Self-pay | Admitting: Physician Assistant

## 2023-10-20 ENCOUNTER — Telehealth: Payer: Self-pay | Admitting: *Deleted

## 2023-10-20 LAB — SURGICAL PATHOLOGY

## 2023-10-20 MED ORDER — METHOCARBAMOL 500 MG PO TABS: 500.0000 mg | ORAL_TABLET | Freq: Three times a day (TID) | ORAL | 0 refills | Status: DC | PRN

## 2023-10-20 MED ORDER — GABAPENTIN 300 MG PO CAPS: 300.0000 mg | ORAL_CAPSULE | Freq: Three times a day (TID) | ORAL | 0 refills | Status: DC

## 2023-10-20 NOTE — Progress Notes (Signed)
      [address]       [place] [address]             [phone]      Patient contacted the office complaining of stabbing pain around her incision. She is currently taking Oxycodone 5mg  Q6H alternating with Tylenol and Gabapentin 300mg  at night. Will increase Gabapentin 300mg  to TID. Will offer robaxin as well if the patient desires. Will send to the [facility] [employer] on [address]. The patient was advised to notify the office if her pain worsens or is not improving.   Jenny Reichmann, PA-C

## 2023-10-20 NOTE — Telephone Encounter (Signed)
Patient contacted the office requesting a stronger pain medication. States she is taking 5mg  of Oxycodone every 6 hrs and alternating the Tylenol. States Her pain is shooting in nature. States she seems to stay in pain and the oxycodone just briefly relieves the pain. Discussed with B. Stehler, PA, who has increased gabapentin to TID and prescribed a course of Robaxin. Patient made aware of medication changes and to contact our office if pain persists.

## 2023-10-23 DIAGNOSIS — Z483 Aftercare following surgery for neoplasm: Secondary | ICD-10-CM | POA: Diagnosis not present

## 2023-10-23 DIAGNOSIS — I10 Essential (primary) hypertension: Secondary | ICD-10-CM | POA: Diagnosis not present

## 2023-10-23 DIAGNOSIS — T797XXD Traumatic subcutaneous emphysema, subsequent encounter: Secondary | ICD-10-CM | POA: Diagnosis not present

## 2023-10-23 DIAGNOSIS — M8580 Other specified disorders of bone density and structure, unspecified site: Secondary | ICD-10-CM | POA: Diagnosis not present

## 2023-10-23 DIAGNOSIS — F419 Anxiety disorder, unspecified: Secondary | ICD-10-CM | POA: Diagnosis not present

## 2023-10-23 DIAGNOSIS — K5730 Diverticulosis of large intestine without perforation or abscess without bleeding: Secondary | ICD-10-CM | POA: Diagnosis not present

## 2023-10-23 DIAGNOSIS — M4716 Other spondylosis with myelopathy, lumbar region: Secondary | ICD-10-CM | POA: Diagnosis not present

## 2023-10-23 DIAGNOSIS — C342 Malignant neoplasm of middle lobe, bronchus or lung: Secondary | ICD-10-CM | POA: Diagnosis not present

## 2023-10-23 DIAGNOSIS — M1990 Unspecified osteoarthritis, unspecified site: Secondary | ICD-10-CM | POA: Diagnosis not present

## 2023-10-24 ENCOUNTER — Ambulatory Visit: Payer: Self-pay | Admitting: *Deleted

## 2023-10-24 DIAGNOSIS — Z4802 Encounter for removal of sutures: Secondary | ICD-10-CM

## 2023-10-24 NOTE — Progress Notes (Signed)
 Patient arrived for nurse visit to remove sutures post-RATS RM Lobectomy 2/6 by Dr. Dorris Fetch.  One suture removed with no signs or symptoms of infection noted.  Patient tolerated suture removal well.  Incision well approximated. Patient and family instructed to keep the incision site clean and dry. Patient and family acknowledged instructions given.  All questions answered.

## 2023-10-26 ENCOUNTER — Other Ambulatory Visit: Payer: Self-pay

## 2023-10-26 DIAGNOSIS — M1990 Unspecified osteoarthritis, unspecified site: Secondary | ICD-10-CM | POA: Diagnosis not present

## 2023-10-26 DIAGNOSIS — M4716 Other spondylosis with myelopathy, lumbar region: Secondary | ICD-10-CM | POA: Diagnosis not present

## 2023-10-26 DIAGNOSIS — M8580 Other specified disorders of bone density and structure, unspecified site: Secondary | ICD-10-CM | POA: Diagnosis not present

## 2023-10-26 DIAGNOSIS — I10 Essential (primary) hypertension: Secondary | ICD-10-CM | POA: Diagnosis not present

## 2023-10-26 DIAGNOSIS — F419 Anxiety disorder, unspecified: Secondary | ICD-10-CM | POA: Diagnosis not present

## 2023-10-26 DIAGNOSIS — T797XXD Traumatic subcutaneous emphysema, subsequent encounter: Secondary | ICD-10-CM | POA: Diagnosis not present

## 2023-10-26 DIAGNOSIS — K5730 Diverticulosis of large intestine without perforation or abscess without bleeding: Secondary | ICD-10-CM | POA: Diagnosis not present

## 2023-10-26 DIAGNOSIS — Z483 Aftercare following surgery for neoplasm: Secondary | ICD-10-CM | POA: Diagnosis not present

## 2023-10-26 DIAGNOSIS — C342 Malignant neoplasm of middle lobe, bronchus or lung: Secondary | ICD-10-CM | POA: Diagnosis not present

## 2023-10-27 NOTE — Progress Notes (Signed)
 The proposed treatment discussed in conference is for discussion purpose only and is not a binding recommendation.  The patients have not been physically examined, or presented with their treatment options.  Therefore, final treatment plans cannot be decided.

## 2023-10-30 ENCOUNTER — Ambulatory Visit
Admission: RE | Admit: 2023-10-30 | Discharge: 2023-10-30 | Disposition: A | Discharge status: Home / Self Care | Payer: Medicare PPO | Source: Ambulatory Visit | Attending: Thoracic Surgery (Cardiothoracic Vascular Surgery) | Admitting: Thoracic Surgery (Cardiothoracic Vascular Surgery)

## 2023-10-30 ENCOUNTER — Other Ambulatory Visit: Payer: Self-pay | Admitting: Thoracic Surgery (Cardiothoracic Vascular Surgery)

## 2023-10-30 DIAGNOSIS — R911 Solitary pulmonary nodule: Secondary | ICD-10-CM

## 2023-10-30 DIAGNOSIS — G319 Degenerative disease of nervous system, unspecified: Secondary | ICD-10-CM | POA: Diagnosis not present

## 2023-10-30 DIAGNOSIS — I6782 Cerebral ischemia: Secondary | ICD-10-CM | POA: Diagnosis not present

## 2023-10-30 DIAGNOSIS — E237 Disorder of pituitary gland, unspecified: Secondary | ICD-10-CM | POA: Diagnosis not present

## 2023-10-30 MED ORDER — GADOPICLENOL 0.5 MMOL/ML IV SOLN
6.5000 mL | Freq: Once | INTRAVENOUS | Status: AC | PRN
  Administered 2023-10-30: 6.5 mL via INTRAVENOUS

## 2023-10-30 NOTE — Progress Notes (Signed)
 cxr

## 2023-10-31 ENCOUNTER — Ambulatory Visit
Admission: RE | Admit: 2023-10-31 | Discharge: 2023-10-31 | Disposition: A | Discharge status: Home / Self Care | Payer: Medicare PPO | Source: Ambulatory Visit | Attending: Thoracic Surgery (Cardiothoracic Vascular Surgery) | Admitting: Thoracic Surgery (Cardiothoracic Vascular Surgery)

## 2023-10-31 ENCOUNTER — Ambulatory Visit (INDEPENDENT_AMBULATORY_CARE_PROVIDER_SITE_OTHER): Payer: Self-pay | Admitting: Thoracic Surgery (Cardiothoracic Vascular Surgery)

## 2023-10-31 VITALS — BP 139/82 | HR 86 | Resp 18 | Ht 62.0 in | Wt 157.0 lb

## 2023-10-31 DIAGNOSIS — F419 Anxiety disorder, unspecified: Secondary | ICD-10-CM | POA: Diagnosis not present

## 2023-10-31 DIAGNOSIS — Z902 Acquired absence of lung [part of]: Secondary | ICD-10-CM

## 2023-10-31 DIAGNOSIS — C342 Malignant neoplasm of middle lobe, bronchus or lung: Secondary | ICD-10-CM | POA: Diagnosis not present

## 2023-10-31 DIAGNOSIS — R911 Solitary pulmonary nodule: Secondary | ICD-10-CM | POA: Diagnosis not present

## 2023-10-31 DIAGNOSIS — K5730 Diverticulosis of large intestine without perforation or abscess without bleeding: Secondary | ICD-10-CM | POA: Diagnosis not present

## 2023-10-31 DIAGNOSIS — M4716 Other spondylosis with myelopathy, lumbar region: Secondary | ICD-10-CM | POA: Diagnosis not present

## 2023-10-31 DIAGNOSIS — M8580 Other specified disorders of bone density and structure, unspecified site: Secondary | ICD-10-CM | POA: Diagnosis not present

## 2023-10-31 DIAGNOSIS — I10 Essential (primary) hypertension: Secondary | ICD-10-CM | POA: Diagnosis not present

## 2023-10-31 DIAGNOSIS — Z483 Aftercare following surgery for neoplasm: Secondary | ICD-10-CM | POA: Diagnosis not present

## 2023-10-31 DIAGNOSIS — T797XXD Traumatic subcutaneous emphysema, subsequent encounter: Secondary | ICD-10-CM | POA: Diagnosis not present

## 2023-10-31 DIAGNOSIS — M1990 Unspecified osteoarthritis, unspecified site: Secondary | ICD-10-CM | POA: Diagnosis not present

## 2023-10-31 DIAGNOSIS — J90 Pleural effusion, not elsewhere classified: Secondary | ICD-10-CM | POA: Diagnosis not present

## 2023-10-31 MED ORDER — GABAPENTIN 300 MG PO CAPS: ORAL_CAPSULE | ORAL | 3 refills | Status: DC

## 2023-10-31 NOTE — Progress Notes (Signed)
 [address]       [name] [address]             [phone]     HPI: Audrey [name] returns for a scheduled follow-up visit after recent right middle lobectomy.  Audrey [name] is a 78 year old non-smoker who was being evaluated for hypercalcemia.  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  She had a PET/CT which showed a right middle lobe mass.  I did a right middle lobectomy on 10/12/2023.  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  Initially there were tumor cells in the peri-vascular tissue.  The final vascular margin was negative .she did well postoperatively and went home on day 4.  She called back complaining of cutaneous hypersensitivity.  Her gabapentin was increased to 300 mg 3 times a day.  She continues to have very sensitive skin.  Is taking oxycodone at night before she goes to bed.  Also using Robaxin and Tylenol occasionally.  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     atorvastatin (LIPITOR) 40 MG tablet Take 40 mg by mouth daily.     Cholecalciferol (VITAMIN D) 50 MCG (2000 UT) tablet Take 2,000 Units by mouth daily.     FLUoxetine (PROZAC) 20 MG capsule Take 20 mg by mouth daily.     guaiFENesin (MUCINEX) 600 MG 12 hr tablet Take 1 tablet (600 mg total) by mouth 2 (two) times daily as needed for to loosen phlegm or cough.     levothyroxine (SYNTHROID, LEVOTHROID) 25 MCG tablet TAKE 1 TABLET  BY MOUTH BEFORE BREAKFAST (Patient taking differently: Take 25 mcg by mouth daily before breakfast.) 90 tablet 3   methocarbamol (ROBAXIN) 500 MG tablet Take 1 tablet (500 mg total) by mouth 3 (three) times daily as needed for muscle spasms. 90 tablet 0   Multiple Vitamins-Minerals (PRESERVISION AREDS) CAPS Take 1 capsule by mouth in the morning and at bedtime.     naproxen sodium (ALEVE) 220 MG tablet Take 220 mg by mouth daily as needed (pain).     nystatin-triamcinolone (MYCOLOG II) cream Apply 1 application topically daily as needed (Vaginal itcing).      oxyCODONE (OXY IR/ROXICODONE) 5 MG immediate release tablet Take 1 tablet (5 mg total) by mouth every 6 (six) hours as needed for moderate pain (pain score 4-6). 30 tablet 0   pantoprazole (PROTONIX) 40 MG tablet Take 40 mg by mouth daily.     SYSTANE ULTRA 0.4-0.3 % SOLN Place 1 drop into both eyes in the morning and at bedtime.     gabapentin (NEURONTIN) 300 MG capsule Take 1 tablet (300 mg) in the morning and afternoon.  Take 2 tablets (600 mg) at bedtime 90 capsule 3   No current facility-administered medications for  this visit.    Physical Exam BP 139/82   Pulse 86   Resp 18   Ht 5\' 2"  (1.575 m)   Wt 157 lb (71.2 kg)   SpO2 94% Comment: RA  BMI 28.8 kg/m  78 year old woman in no acute distress Alert and oriented x 3 with no focal deficits Lungs clear bilaterally Incisions clean dry and intact  Diagnostic Tests: PACS system is down, cannot review films.  Impression: Audrey [name] is a 78 year old woman who is a lifelong non-smoker.  She was incidentally found to have a 3 cm right middle lobe mass during workup for hypercalcemia.  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  Given that she is a non-smoker the tissue was sent for molecular testing.  She may be a candidate for targeted therapy if she has a mutation.  Margins were close but final margins were negative.  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  I doubt that is metastasis but will need follow-up.  There are no restrictions on her activities but she should avoid activities that cause pain.  Continues to have cutaneous hypersensitivity.  Consistent with intercostal neuralgia.  Will increase gabapentin to 300 mg twice daily and then 600 mg at night.  I gave her some refills for gabapentin.  Continue to use Robaxin, Tylenol, and oxycodone as needed.  She will call when she needs more oxycodone.  Will check with Dr. Candise Che to see if he wants to continue to follow her.  She has an appointment with him in June but obviously needs to be seen sooner than that.  Plan:  Clarify oncology plan Adjust gabapentin dosage as noted above Return in 3 weeks to check on progress  Loreli Slot, MD [facility] [phone]

## 2023-11-02 DIAGNOSIS — M8580 Other specified disorders of bone density and structure, unspecified site: Secondary | ICD-10-CM | POA: Diagnosis not present

## 2023-11-02 DIAGNOSIS — M4716 Other spondylosis with myelopathy, lumbar region: Secondary | ICD-10-CM | POA: Diagnosis not present

## 2023-11-02 DIAGNOSIS — K5730 Diverticulosis of large intestine without perforation or abscess without bleeding: Secondary | ICD-10-CM | POA: Diagnosis not present

## 2023-11-02 DIAGNOSIS — M1990 Unspecified osteoarthritis, unspecified site: Secondary | ICD-10-CM | POA: Diagnosis not present

## 2023-11-02 DIAGNOSIS — C342 Malignant neoplasm of middle lobe, bronchus or lung: Secondary | ICD-10-CM | POA: Diagnosis not present

## 2023-11-02 DIAGNOSIS — I10 Essential (primary) hypertension: Secondary | ICD-10-CM | POA: Diagnosis not present

## 2023-11-02 DIAGNOSIS — T797XXD Traumatic subcutaneous emphysema, subsequent encounter: Secondary | ICD-10-CM | POA: Diagnosis not present

## 2023-11-02 DIAGNOSIS — Z483 Aftercare following surgery for neoplasm: Secondary | ICD-10-CM | POA: Diagnosis not present

## 2023-11-02 DIAGNOSIS — F419 Anxiety disorder, unspecified: Secondary | ICD-10-CM | POA: Diagnosis not present

## 2023-11-07 DIAGNOSIS — I10 Essential (primary) hypertension: Secondary | ICD-10-CM | POA: Diagnosis not present

## 2023-11-07 DIAGNOSIS — M1990 Unspecified osteoarthritis, unspecified site: Secondary | ICD-10-CM | POA: Diagnosis not present

## 2023-11-07 DIAGNOSIS — F419 Anxiety disorder, unspecified: Secondary | ICD-10-CM | POA: Diagnosis not present

## 2023-11-07 DIAGNOSIS — M8580 Other specified disorders of bone density and structure, unspecified site: Secondary | ICD-10-CM | POA: Diagnosis not present

## 2023-11-07 DIAGNOSIS — Z483 Aftercare following surgery for neoplasm: Secondary | ICD-10-CM | POA: Diagnosis not present

## 2023-11-07 DIAGNOSIS — T797XXD Traumatic subcutaneous emphysema, subsequent encounter: Secondary | ICD-10-CM | POA: Diagnosis not present

## 2023-11-07 DIAGNOSIS — K5730 Diverticulosis of large intestine without perforation or abscess without bleeding: Secondary | ICD-10-CM | POA: Diagnosis not present

## 2023-11-07 DIAGNOSIS — M4716 Other spondylosis with myelopathy, lumbar region: Secondary | ICD-10-CM | POA: Diagnosis not present

## 2023-11-07 DIAGNOSIS — C342 Malignant neoplasm of middle lobe, bronchus or lung: Secondary | ICD-10-CM | POA: Diagnosis not present

## 2023-11-08 ENCOUNTER — Other Ambulatory Visit: Payer: Self-pay | Admitting: *Deleted

## 2023-11-08 DIAGNOSIS — C342 Malignant neoplasm of middle lobe, bronchus or lung: Secondary | ICD-10-CM | POA: Diagnosis not present

## 2023-11-08 MED ORDER — GABAPENTIN 300 MG PO CAPS: ORAL_CAPSULE | ORAL | 3 refills | Status: DC

## 2023-11-08 NOTE — Progress Notes (Signed)
 Patient called regarding gabapentin RX. Spoke with Pharmacy and adjusted quantity of prescription to match directions of use. Patient aware.

## 2023-11-14 DIAGNOSIS — I10 Essential (primary) hypertension: Secondary | ICD-10-CM | POA: Diagnosis not present

## 2023-11-14 DIAGNOSIS — F419 Anxiety disorder, unspecified: Secondary | ICD-10-CM | POA: Diagnosis not present

## 2023-11-14 DIAGNOSIS — M4716 Other spondylosis with myelopathy, lumbar region: Secondary | ICD-10-CM | POA: Diagnosis not present

## 2023-11-14 DIAGNOSIS — T797XXD Traumatic subcutaneous emphysema, subsequent encounter: Secondary | ICD-10-CM | POA: Diagnosis not present

## 2023-11-14 DIAGNOSIS — Z483 Aftercare following surgery for neoplasm: Secondary | ICD-10-CM | POA: Diagnosis not present

## 2023-11-14 DIAGNOSIS — M8580 Other specified disorders of bone density and structure, unspecified site: Secondary | ICD-10-CM | POA: Diagnosis not present

## 2023-11-14 DIAGNOSIS — C342 Malignant neoplasm of middle lobe, bronchus or lung: Secondary | ICD-10-CM | POA: Diagnosis not present

## 2023-11-14 DIAGNOSIS — K5730 Diverticulosis of large intestine without perforation or abscess without bleeding: Secondary | ICD-10-CM | POA: Diagnosis not present

## 2023-11-14 DIAGNOSIS — M1990 Unspecified osteoarthritis, unspecified site: Secondary | ICD-10-CM | POA: Diagnosis not present

## 2023-11-15 ENCOUNTER — Encounter (HOSPITAL_COMMUNITY): Payer: Self-pay

## 2023-11-20 ENCOUNTER — Other Ambulatory Visit: Payer: Self-pay | Admitting: Thoracic Surgery (Cardiothoracic Vascular Surgery)

## 2023-11-20 DIAGNOSIS — R911 Solitary pulmonary nodule: Secondary | ICD-10-CM

## 2023-11-20 DIAGNOSIS — H353132 Nonexudative age-related macular degeneration, bilateral, intermediate dry stage: Secondary | ICD-10-CM | POA: Diagnosis not present

## 2023-11-20 DIAGNOSIS — H3581 Retinal edema: Secondary | ICD-10-CM | POA: Diagnosis not present

## 2023-11-21 ENCOUNTER — Ambulatory Visit (INDEPENDENT_AMBULATORY_CARE_PROVIDER_SITE_OTHER): Payer: Self-pay | Admitting: Thoracic Surgery (Cardiothoracic Vascular Surgery)

## 2023-11-21 ENCOUNTER — Ambulatory Visit
Admission: RE | Admit: 2023-11-21 | Discharge: 2023-11-21 | Disposition: A | Discharge status: Home / Self Care | Source: Ambulatory Visit | Attending: Thoracic Surgery (Cardiothoracic Vascular Surgery) | Admitting: Thoracic Surgery (Cardiothoracic Vascular Surgery)

## 2023-11-21 VITALS — BP 147/82 | HR 71 | Resp 18 | Ht 62.0 in | Wt 156.0 lb

## 2023-11-21 DIAGNOSIS — K449 Diaphragmatic hernia without obstruction or gangrene: Secondary | ICD-10-CM | POA: Diagnosis not present

## 2023-11-21 DIAGNOSIS — R911 Solitary pulmonary nodule: Secondary | ICD-10-CM | POA: Diagnosis not present

## 2023-11-21 DIAGNOSIS — Z902 Acquired absence of lung [part of]: Secondary | ICD-10-CM

## 2023-11-21 NOTE — Progress Notes (Signed)
 [address]       [place] [address]             [phone]      HPI: [name] returns for a scheduled follow-up visit after recent right middle lobectomy.  Audrey [name] is a 78 year old woman who is a lifelong non-smoker.  She was found to have hypercalcemia.  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  A PET/CT was done to evaluate that and she was found to have a hypermetabolic right middle lobe mass.  She underwent right middle lobectomy on 10/12/2023.  The mass was a T2a, N0, stage Ib adenocarcinoma.  Final margin was negative.  She went home on postoperative day #4.  She was having a lot of issues with cutaneous hypersensitivity.  Her gabapentin was increased to 300 mg morning and afternoon and 600 mg at night.  She still has it to some degree but that has helped.  Also complains of a relatively frequent cough.  She has not taken anything for that.  Not using any narcotics.  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     atorvastatin (LIPITOR) 40 MG tablet Take 40 mg by mouth daily.     Cholecalciferol (VITAMIN D) 50 MCG (2000 UT) tablet Take 2,000 Units by mouth daily.     FLUoxetine (PROZAC) 20 MG capsule Take 20 mg by mouth daily.     gabapentin (NEURONTIN) 300 MG capsule Take 1 tablet (300 mg) in the morning and afternoon.  Take 2 tablets (600 mg) at bedtime 120 capsule 3   guaiFENesin (MUCINEX) 600 MG 12 hr  tablet Take 1 tablet (600 mg total) by mouth 2 (two) times daily as needed for to loosen phlegm or cough.     levothyroxine (SYNTHROID, LEVOTHROID) 25 MCG tablet TAKE 1 TABLET BY MOUTH BEFORE BREAKFAST (Patient taking differently: Take 25 mcg by mouth daily before breakfast.) 90 tablet 3   methocarbamol (ROBAXIN) 500 MG tablet Take 1 tablet (500 mg total) by mouth 3 (three) times daily as needed for muscle spasms. 90 tablet 0   Multiple Vitamins-Minerals (PRESERVISION AREDS) CAPS Take 1 capsule by mouth in the morning and at bedtime.     naproxen sodium (ALEVE) 220 MG tablet Take 220 mg by mouth daily as needed (pain).     nystatin-triamcinolone (MYCOLOG II) cream Apply 1 application topically daily as needed (Vaginal itcing).      pantoprazole (PROTONIX) 40 MG tablet Take 40 mg by mouth daily.     SYSTANE ULTRA 0.4-0.3 % SOLN Place 1 drop into both eyes in the morning and at bedtime.     No current facility-administered medications for this visit.    Physical Exam BP (!) 147/82 (BP Location: Right Arm)   Pulse 71   Resp  18   Ht 5\' 2"  (1.575 m)   Wt 156 lb (70.8 kg)   SpO2 94%   BMI 28.23 kg/m  78 year old woman in no acute distress Alert and oriented x 3 with no focal deficits Lungs clear with equal breath sounds bilaterally Cardiac regular rate and rhythm Incisions well-healed  Diagnostic Tests: I personally reviewed the chest x-ray images.  Postoperative changes from right middle lobectomy.  Nodular densities around staple lines improved compared to prior film.  Impression: Audrey [name] is a 78 year old woman incidentally found to have a right middle lobe mass during evaluation for hypercalcemia.  She underwent a right middle lobectomy.  Final pathology was a T2a, N0, stage Ib adenocarcinoma.  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  Adenocarcinoma-stage Ib with EGFR mutation.  Sees Dr. Candise Che next week.  Hopefully will be a candidate for targeted therapy.  Status  post right middle lobectomy-overall doing well.  She does have some incisional discomfort and cutaneous hypersensitivity.  She is not taking any narcotics.  Still taking gabapentin 3 times daily.  Total of 1200 mg a day.  I did caution her that when she wants to come off the gabapentin she should do so on a gradual basis dropping 1 pill at a time for a week before further decreasing.  If she needs a refill on her gabapentin she will contact us.  No restrictions on her activities at this point but she should build into new activities gradually.  She has had a cough.  It is not as bad as I have seen in some people but is very common after lobectomy.  She will try over-the-counter cough medication if that is effective we can give her a trial of prednisone. Plan: Follow-up with Dr. Candise Che next week I will plan to see her back in about 3 months to check on her progress She will call if she needs anything in the interim.  Loreli Slot, MD [facility] [phone]

## 2023-11-22 DIAGNOSIS — H353132 Nonexudative age-related macular degeneration, bilateral, intermediate dry stage: Secondary | ICD-10-CM | POA: Diagnosis not present

## 2023-11-23 DIAGNOSIS — M8580 Other specified disorders of bone density and structure, unspecified site: Secondary | ICD-10-CM | POA: Diagnosis not present

## 2023-11-23 DIAGNOSIS — Z483 Aftercare following surgery for neoplasm: Secondary | ICD-10-CM | POA: Diagnosis not present

## 2023-11-23 DIAGNOSIS — F419 Anxiety disorder, unspecified: Secondary | ICD-10-CM | POA: Diagnosis not present

## 2023-11-23 DIAGNOSIS — C342 Malignant neoplasm of middle lobe, bronchus or lung: Secondary | ICD-10-CM | POA: Diagnosis not present

## 2023-11-23 DIAGNOSIS — M1990 Unspecified osteoarthritis, unspecified site: Secondary | ICD-10-CM | POA: Diagnosis not present

## 2023-11-23 DIAGNOSIS — I10 Essential (primary) hypertension: Secondary | ICD-10-CM | POA: Diagnosis not present

## 2023-11-23 DIAGNOSIS — T797XXD Traumatic subcutaneous emphysema, subsequent encounter: Secondary | ICD-10-CM | POA: Diagnosis not present

## 2023-11-23 DIAGNOSIS — M4716 Other spondylosis with myelopathy, lumbar region: Secondary | ICD-10-CM | POA: Diagnosis not present

## 2023-11-23 DIAGNOSIS — K5730 Diverticulosis of large intestine without perforation or abscess without bleeding: Secondary | ICD-10-CM | POA: Diagnosis not present

## 2023-11-27 ENCOUNTER — Encounter: Payer: Self-pay | Admitting: Pharmacy Technician

## 2023-11-27 ENCOUNTER — Inpatient Hospital Stay: Attending: Hematology

## 2023-11-27 ENCOUNTER — Inpatient Hospital Stay: Attending: Hematology | Admitting: Hematology

## 2023-11-27 ENCOUNTER — Other Ambulatory Visit (HOSPITAL_COMMUNITY): Payer: Self-pay

## 2023-11-27 ENCOUNTER — Telehealth: Payer: Self-pay

## 2023-11-27 ENCOUNTER — Telehealth: Payer: Self-pay | Admitting: Pharmacy Technician

## 2023-11-27 VITALS — BP 104/58 | HR 64 | Temp 97.9°F | Resp 17 | Ht 62.0 in | Wt 157.7 lb

## 2023-11-27 DIAGNOSIS — Z79899 Other long term (current) drug therapy: Secondary | ICD-10-CM | POA: Insufficient documentation

## 2023-11-27 DIAGNOSIS — C342 Malignant neoplasm of middle lobe, bronchus or lung: Secondary | ICD-10-CM

## 2023-11-27 DIAGNOSIS — E237 Disorder of pituitary gland, unspecified: Secondary | ICD-10-CM

## 2023-11-27 LAB — CMP (CANCER CENTER ONLY)
ALT: 14 U/L (ref 0–44)
AST: 16 U/L (ref 15–41)
Albumin: 4.2 g/dL (ref 3.5–5.0)
Alkaline Phosphatase: 98 U/L (ref 38–126)
Anion gap: 7 (ref 5–15)
BUN: 15 mg/dL (ref 8–23)
CO2: 26 mmol/L (ref 22–32)
Calcium: 10.6 mg/dL — ABNORMAL HIGH (ref 8.9–10.3)
Chloride: 108 mmol/L (ref 98–111)
Creatinine: 0.63 mg/dL (ref 0.44–1.00)
GFR, Estimated: >60 mL/min (ref >60)
Glucose, Bld: 96 mg/dL (ref 70–99)
Potassium: 4.1 mmol/L (ref 3.5–5.1)
Sodium: 141 mmol/L (ref 135–145)
Total Bilirubin: 0.3 mg/dL (ref 0.0–1.2)
Total Protein: 7 g/dL (ref 6.5–8.1)

## 2023-11-27 LAB — CBC WITH DIFFERENTIAL (CANCER CENTER ONLY)
Abs Immature Granulocytes: 0.01 10*3/uL (ref 0.00–0.07)
Basophils Absolute: 0 10*3/uL (ref 0.0–0.1)
Basophils Relative: 1 %
Eosinophils Absolute: 0.3 10*3/uL (ref 0.0–0.5)
Eosinophils Relative: 5 %
HCT: 37 % (ref 36.0–46.0)
Hemoglobin: 11.9 g/dL — ABNORMAL LOW (ref 12.0–15.0)
Immature Granulocytes: 0 %
Lymphocytes Relative: 29 %
Lymphs Abs: 1.8 10*3/uL (ref 0.7–4.0)
MCH: 29.7 pg (ref 26.0–34.0)
MCHC: 32.2 g/dL (ref 30.0–36.0)
MCV: 92.3 fL (ref 80.0–100.0)
Monocytes Absolute: 0.5 10*3/uL (ref 0.1–1.0)
Monocytes Relative: 8 %
Neutro Abs: 3.7 10*3/uL (ref 1.7–7.7)
Neutrophils Relative %: 57 %
Platelet Count: 274 10*3/uL (ref 150–400)
RBC: 4.01 MIL/uL (ref 3.87–5.11)
RDW: 13.3 % (ref 11.5–15.5)
WBC Count: 6.3 10*3/uL (ref 4.0–10.5)
nRBC: 0 % (ref 0.0–0.2)

## 2023-11-27 LAB — MAGNESIUM: Magnesium: 2.2 mg/dL (ref 1.7–2.4)

## 2023-11-27 LAB — IRON AND IRON BINDING CAPACITY (CC-WL,HP ONLY)
Iron: 42 ug/dL (ref 28–170)
Saturation Ratios: 9 % — ABNORMAL LOW (ref 10.4–31.8)
TIBC: 447 ug/dL (ref 250–450)
UIBC: 405 ug/dL (ref 148–442)

## 2023-11-27 LAB — FERRITIN: Ferritin: 12 ng/mL (ref 11–307)

## 2023-11-27 MED ORDER — OSIMERTINIB MESYLATE 80 MG PO TABS
80.0000 mg | ORAL_TABLET | Freq: Every day | ORAL | 11 refills | Status: DC
  Filled 2023-11-29: qty 30, 30d supply, fill #0
  Filled 2023-12-19: qty 30, 30d supply, fill #1

## 2023-11-27 NOTE — Telephone Encounter (Signed)
 Oral Oncology Patient Advocate Encounter    Received notification that prior authorization for Tagrisso is required.   Insurance verification completed.   The patient is insured through [employer] .  PA required and submitted KEY/EOC/Request #: [id number] APPROVED from 09/06/23 to 09/04/24  $100 copay  PA Case ID #: [id number]  Sherilyn Dacosta, CPhT [facility] Patient Advocate Phone: [phone] Fax: [phone]

## 2023-11-27 NOTE — Progress Notes (Signed)
 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   HISTORY OF PRESENTING ILLNESS:   Audrey [name] is a wonderful 78 y.o. female who has been referred to [place] by Georgann Housekeeper, MD for evaluation and management of M spike on UPEP and hypercalcemia.  Today she is accompanied by her husband and daughter-in-law, who help provide history. Patient reports that her PCP is Dr. Katrinka Blazing. Patient reports that her calcium has been elevated over the last couple of months.   She denies any lumps/bumps, fever, chills, night sweats, unexplained weight loss, or night sweats. Patient reports normal eating habits. Patient was noted to be hard of hearing.   She complains of generalized aching after activity present over the last couple of years. If she rests, her symptoms resolve. She denies any  localized discomfort to a particular area. She denies any spine pain with palpation.  Patient has had falls in the past and has had compression fractures in the spine with pain in those areas. No lytics lesions were found on imaging. Patient reports that she last received imaging of the back 6-12 months ago.   She reports that she cannot lay on her right side as pressure to her hip causes pain. This symptom has been present  for a while and is stable.   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 Patient denies any major inctions such as COVID-19 or flu.  Patient has no known kidney disease or autoimmune conditions such as RA or lupus. Her daughter-in-law r(Dr Nguyet Mercer) reports that patient's son has Hoshimoto's thyroiditis and it is possible that patient may also have it. Patient does take thyroid medication.   Patient reports that she was recently diagnosed with  Osteoporosis and has not yet started treatment for it. Her daughter-in-law reports that patient is currently in the prior authorization stage for Prolia. Patient did use Fosamax a while ago.   She complains of brain fog, balance issues, and mild dizziness. Patient has had balance issues for several years. She has tried  balance physical therapy in the past.   Patient reports that she is constantly cold.   She reports that she is currently in the beginning stages of macular degeneration.   She generally does not drink any water and does drink caffeine regularly.   INTERVAL HISTORY:  Audrey [name] is a wonderful 78 y.o. 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   Patient was last seen by me on 10/02/2023 and she complained of bone pain, back pain, and joint pain.   Patient is accompanied by her husband and her daughter. Patient notes she has been doing well overall. She had a recent Robotic assisted thoracoscopy-right Middle Lobectomy on 10/12/2023, which went well without any new or severe toxicities.   She denies any new infection issues, fever, chills, night sweats, unexpected weight loss, back pain, chest pain, or leg swelling.     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; Dr Yetta Barre   Fracture    right 4th and 5th metacarpal   GERD (gastroesophageal reflux disease)    Hearing aid  worn    B/L   History of hiatal hernia    HOH (hard of hearing)    Hyperlipidemia    Hypothyroidism    Osteopenia after menopause    Pneumonia    Uterine prolapse    Wears glasses     SURGICAL HISTORY: Past Surgical History:  Procedure Laterality Date   CATARACT EXTRACTION Bilateral    COLONOSCOPY  09/05/2010   neg; [facility]   HEMORRHOID SURGERY     INTERCOSTAL NERVE BLOCK Right 10/12/2023    Procedure: INTERCOSTAL NERVE BLOCK;  Surgeon: Loreli Slot, MD;  Location: [facility] OR;  Service: Thoracic;  Laterality: Right;   NODE DISSECTION Right 10/12/2023   Procedure: NODE DISSECTION;  Surgeon: Loreli Slot, MD;  Location: [facility] OR;  Service: Thoracic;  Laterality: Right;   OPEN REDUCTION INTERNAL FIXATION (ORIF) METACARPAL Right 04/10/2020   Procedure: OPEN REDUCTION INTERNAL FIXATION (ORIF)  AS NECESSARY RIGHT FOURTH AND FIFTH METACARPAL FRACTURES;  Surgeon: Dominica Severin, MD;  Location: [facility] OR;  Service: Orthopedics;  Laterality: Right;  OPEN REDUCTION INTERNAL FIXATION (ORIF)  AS NECESSARY RIGHT FOURTH AND FIFTH METACARPAL FRACTURES    SOCIAL HISTORY: Social History   Socioeconomic History   Marital status: Married    Spouse name: Not on file   Number of children: 2   Years of education: Not on file   Highest education level: Not on file  Occupational History   Not on file  Tobacco Use   Smoking status: Never   Smokeless tobacco: Never  Vaping Use   Vaping status: Never Used  Substance and Sexual Activity   Alcohol use: No    Alcohol/week: 0.0 standard drinks of alcohol   Drug use: No   Sexual activity: Not on file  Other Topics Concern   Not on file  Social History Narrative   Lives with spouse;   One son in [place]   Dtr in [place]   2 grand children   2 twin girl; in [place] in July (11)   Social Drivers of Health   Financial Resource Strain: Low Risk  (07/12/2018)   Overall Financial Resource Strain (CARDIA)    Difficulty of Paying Living Expenses: Not hard at all  Food Insecurity: No Food Insecurity (10/14/2023)   Hunger Vital Sign    Worried About Running Out of Food in the Last Year: Never true    Ran Out of Food in the Last Year: Never true  Transportation Needs: No Transportation Needs (10/14/2023)   PRAPARE - Administrator, Civil Service (Medical): No    Lack of Transportation (Non-Medical): No  Physical Activity: Inactive  (07/12/2018)   Exercise Vital Sign    Days of Exercise per Week: 0 days    Minutes of Exercise per Session: 0 min  Stress: No Stress Concern Present (07/12/2018)   [employer] of Occupational Health - Occupational Stress Questionnaire    Feeling of Stress : Not at all  Social Connections: Unknown (10/15/2023)   Social Connection and Isolation Panel [NHANES]    Frequency of Communication with Friends and Family: More than three times a week    Frequency of Social Gatherings with Friends and Family: More than three times a week    Attends Religious Services: Patient declined    Active Member of Clubs or Organizations: Patient declined    Attends Banker Meetings: Patient declined    Marital Status: Married  Catering manager Violence: Not At Risk (10/14/2023)   Humiliation, Afraid,  Rape, and Kick questionnaire    Fear of Current or Ex-Partner: No    Emotionally Abused: No    Physically Abused: No    Sexually Abused: No    FAMILY HISTORY: Family History  Problem Relation Age of Onset   Heart attack Mother        in 10s   Uterine cancer Mother    Heart attack Father        in 5s   Coronary artery disease Brother        stent in 70s   Other Brother        ? leukemia   Stroke Maternal Grandmother    Heart attack Maternal Grandfather        in 67s   Diabetes Neg Hx    Colon cancer Neg Hx    Esophageal cancer Neg Hx    Rectal cancer Neg Hx    Stomach cancer Neg Hx     ALLERGIES:  is allergic to celecoxib and sulfonamide derivatives.  MEDICATIONS:  Current Outpatient Medications  Medication Sig Dispense Refill   acetaminophen (TYLENOL) 500 MG tablet Take 1-2 tablets (500-1,000 mg total) by mouth every 6 (six) hours as needed.     atorvastatin (LIPITOR) 40 MG tablet Take 40 mg by mouth daily.     Cholecalciferol (VITAMIN D) 50 MCG (2000 UT) tablet Take 2,000 Units by mouth daily.     FLUoxetine (PROZAC) 20 MG capsule Take 20 mg by mouth daily.     gabapentin  (NEURONTIN) 300 MG capsule Take 1 tablet (300 mg) in the morning and afternoon.  Take 2 tablets (600 mg) at bedtime 120 capsule 3   guaiFENesin (MUCINEX) 600 MG 12 hr tablet Take 1 tablet (600 mg total) by mouth 2 (two) times daily as needed for to loosen phlegm or cough.     levothyroxine (SYNTHROID, LEVOTHROID) 25 MCG tablet TAKE 1 TABLET BY MOUTH BEFORE BREAKFAST (Patient taking differently: Take 25 mcg by mouth daily before breakfast.) 90 tablet 3   methocarbamol (ROBAXIN) 500 MG tablet Take 1 tablet (500 mg total) by mouth 3 (three) times daily as needed for muscle spasms. 90 tablet 0   Multiple Vitamins-Minerals (PRESERVISION AREDS) CAPS Take 1 capsule by mouth in the morning and at bedtime.     naproxen sodium (ALEVE) 220 MG tablet Take 220 mg by mouth daily as needed (pain).     nystatin-triamcinolone (MYCOLOG II) cream Apply 1 application topically daily as needed (Vaginal itcing).      pantoprazole (PROTONIX) 40 MG tablet Take 40 mg by mouth daily.     SYSTANE ULTRA 0.4-0.3 % SOLN Place 1 drop into both eyes in the morning and at bedtime.     No current facility-administered medications for this visit.    REVIEW OF SYSTEMS:    negative except as noted above.  PHYSICAL EXAMINATION:  .BP (!) 104/58 (BP Location: Left Arm, Patient Position: Sitting) Comment: Nurse notified  Pulse 64   Temp 97.9 F (36.6 C) (Temporal)   Resp 17   Ht 5\' 2"  (1.575 m)   Wt 157 lb 11.2 oz (71.5 kg)   SpO2 100%   BMI 28.84 kg/m   GENERAL:alert, in no acute distress and comfortable SKIN: no acute rashes, no significant lesions EYES: conjunctiva are pink and non-injected, sclera anicteric OROPHARYNX: MMM, no exudates, no oropharyngeal erythema or ulceration NECK: supple, no JVD LYMPH:  no palpable lymphadenopathy in the cervical, axillary or inguinal regions LUNGS: clear to auscultation b/l with  normal respiratory effort HEART: regular rate & rhythm ABDOMEN:  normoactive bowel sounds , non tender,  not distended. Extremity: no pedal edema PSYCH: alert & oriented x 3 with fluent speech NEURO: no focal motor/sensory deficits  LABORATORY DATA:  I have reviewed the data as listed                .            Latest Ref Rng & Units 11/27/2023   11:15 AM 10/14/2023    2:09 AM 10/13/2023    2:10 AM  CBC  WBC 4.0 - 10.5 K/uL 6.3  9.3  11.9   Hemoglobin 12.0 - 15.0 g/dL 24.4  01.0  27.2   Hematocrit 36.0 - 46.0 % 37.0  32.3  33.9   Platelets 150 - 400 K/uL 274  208  226     .    Latest Ref Rng & Units 11/27/2023   11:15 AM 10/14/2023    2:09 AM 10/13/2023    2:10 AM  CMP  Glucose 70 - 99 mg/dL 96  536  644   BUN 8 - 23 mg/dL 15  14  10    Creatinine 0.44 - 1.00 mg/dL 0.34  7.42  5.95   Sodium 135 - 145 mmol/L 141  138  138   Potassium 3.5 - 5.1 mmol/L 4.1  4.1  4.3   Chloride 98 - 111 mmol/L 108  108  107   CO2 22 - 32 mmol/L 26  22  22    Calcium 8.9 - 10.3 mg/dL 63.8  9.6  9.9   Total Protein 6.5 - 8.1 g/dL 7.0  5.5    Total Bilirubin 0.0 - 1.2 mg/dL 0.3  0.6    Alkaline Phos 38 - 126 U/L 98  60    AST 15 - 41 U/L 16  26    ALT 0 - 44 U/L 14  12     Component     Latest Ref Rng 08/12/2023 08/16/2023  Total Protein, Urine-UPE24     Not Estab. mg/dL  8.5   Total Protein, Urine-Ur/day     30 - 150 mg/24 hr  106   ALBUMIN, U     %  23.3   ALPHA 1 URINE     %  6.7   Alpha 2, Urine     %  19.4   % BETA, Urine     %  21.6   GAMMA GLOBULIN URINE     %  28.9   Free Kappa Lt Chains,Ur     1.17 - 86.46 mg/L  146.10 (H)   Free Lambda Lt Chains,Ur     0.27 - 15.21 mg/L  6.36 (C)  Free Kappa/Lambda Ratio     1.83 - 14.26   22.97 (H) (C)  Immunofixation Result, Urine  Comment ! (C)  Total Volume  1,250   M-SPIKE %, Urine     Not Observed %  14.3 (H) (C)  M-Spike, mg/24 hr     Not Observed mg/24 hr  15 (H) (C)  NOTE:  Comment (C)  IgG (Immunoglobin G), Serum     586 - 1,602 mg/dL 756    IgA     64 - 433 mg/dL 95    IgM (Immunoglobulin M), Srm     26  - 217 mg/dL 70    Total Protein ELP     6.0 - 8.5 g/dL 6.4 (C)   Albumin SerPl Elph-Mcnc  2.9 - 4.4 g/dL 3.6 (C)   Alpha 1     0.0 - 0.4 g/dL 0.2 (C)   Alpha2 Glob SerPl Elph-Mcnc     0.4 - 1.0 g/dL 0.7 (C)   B-Globulin SerPl Elph-Mcnc     0.7 - 1.3 g/dL 1.0 (C)   Gamma Glob SerPl Elph-Mcnc     0.4 - 1.8 g/dL 0.8 (C)   M Protein SerPl Elph-Mcnc     Not Observed g/dL Not Observed (C)   Globulin, Total     2.2 - 3.9 g/dL 2.8 (C)   Albumin/Glob SerPl     0.7 - 1.7  1.3 (C)   IFE 1 Comment (C)   Please Note (HCV): Comment (C)   Kappa free light chain     3.3 - 19.4 mg/L 113.0 (H)    Lambda free light chains     5.7 - 26.3 mg/L 13.0    Kappa, lambda light chain ratio     0.26 - 1.65  8.69 (H)    Calcium, Ionized, Serum     4.5 - 5.6 mg/dL 5.5    LDH     98 - 244 U/L 186    Sed Rate     0 - 22 mm/hr 7    Beta-2 Microglobulin     0.6 - 2.4 mg/L 1.8      Legend: (H) High ! Abnormal (C) Corrected   SURGICAL PATHOLOGY RESULT from 10/12/2023:    Results Surgical pathology (Order [id number]) Surgical pathology Order: [id number]  Status: Edited Result - FINAL     Next appt: 01/22/2024 at 10:30 AM in Oncology ([facility])   Test Result Released: Yes (seen)   0 Result Notes    Component Ref Range & Units (hover) 1 mo ago  SURGICAL PATHOLOGY SURGICAL PATHOLOGY CASE: [id number] PATIENT: Maximiano Coss Surgical Pathology Report     Clinical History: right middle lobe lung mass (cf)     FINAL MICROSCOPIC DIAGNOSIS:  A. LYMPH NODE, LEVEL 12 #2, EXCISION:      One lymph node, negative for metastatic carcinoma (0/1).  B. LUNG, RIGHT UPPER LOBE WEDGE RESECTION:      Benign pulmonary parenchyma with hemorrhage and necrosis.      Negative for malignancy.  C. LYMPH NODE, LEVEL 12 #3, EXCISION:      One lymph node, negative for metastatic carcinoma (0/1).  D. LUNG, RIGHT MIDDLE LOBECTOMY:      Adenocarcinoma, acinar (90%), papillary (5%) and  micropapillary (5%) patterns.      Tumor size: 3.2 cm.      Lymphovascular invasion identified.      Vascular margin is positive for carcinoma (see final margins).      Bronchial margin is negative for carcinoma.      See oncology table.  E. LYMPH NODE, LEVEL 9, EXCISION:      One lymph node, negative for metastatic carcinoma (0/1).  F. LYMPH NODE, LEVEL 8, EXCISION:      One lymph node, negative for metastatic carcinoma (0/1).  G. LYMPH NODE, LEVEL 7, EXCISION:      One lymph node, negative for metastatic carcinoma (0/1).  H. LYMPH NODE, LEVEL 10, EXCISION:      One lymph node, negative for metastatic carcinoma (0/1).  I. LYMPH NODE, LEVEL 7 #2, EXCISION:      One lymph node, negative for metastatic carcinoma (0/1). J. LYMPH NODE, LEVEL 10 #2, EXCISION:      One lymph node, negative for metastatic carcinoma (0/1).  K. LYMPH NODE, LEVEL 10 #3, EXCISION:      One lymph node, negative for metastatic carcinoma (0/1).  L. LYMPH NODE, LEVEL 12, EXCISION:      One lymph node, negative for metastatic carcinoma (0/1).  M. VASCULAR MARGIN #1, EXCISION:      Vascular tissue, negative for malignancy.  N. VASCULAR MARGIN #2, EXCISION:      Vascular tissue, negative for malignancy.  O. VASCULAR MARGIN #3, EXCISION:      Vascular tissue, negative for malignancy.  P. VASCULAR MARGIN #4, EXCISION:      Vascular tissue, negative for malignancy.  Q. VASCULAR MARGIN #5, EXCISION:      Vascular tissue, negative for malignancy.  R. VASCULAR MARGIN #6, EXCISION:      Vascular tissue, negative for malignancy.  ONCOLOGY TABLE:  LUNG: Resection  Synchronous Tumors: Not applicable Total Number of Primary Tumors: One Procedure: Lobectomy Specimen Laterality: Right Tumor Focality: Unifocal Tumor Site: Right middle lobe Tumor Size: 3.2 cm      Total Tumor Size: 3.2 cm      Invasive Tumor Size (applies only to invasive nonmucinous adenocarcinoma with a lepidic           component):  3.2 cm Histologic Type: Adenocarcinoma Visceral Pleura Invasion: Not identified Direct Invasion of Adjacent Structures: No adjacent structures present Lymphovascular Invasion: Identified Margins: All margins negative for invasive carcinoma      Closest Margin(s) to Invasive Carcinoma: Cannot be determined due to separated margin      Margin(s) Involved by Invasive Carcinoma: None      Margin Status for Non-Invasive Tumor: None Treatment Effect: No known presurgical therapy      Percentage of Residual Viable Tumor: NA Regional Lymph Nodes:      Number of Lymph Nodes Involved: 0                           Nodal Sites with Tumor: NA      Number of Lymph Nodes Examined: 10                      Nodal Sites Examined: Level 7, 8, 9, 10, 12 Distant Metastasis:      Distant Site(s) Involved: Not applicable Pathologic Stage Classification (pTNM, AJCC 8th Edition): pT2a, pN0 Ancillary Studies: Can be performed upon request Representative Tumor Block: D2 Comment(s): See comment ([id number])  COMMENT:  Immunohistochemical stains for CK AE1/AE3 and TTF-1 were performed on vascular margins (N, O, R and P). There are no definitive positive cells. The margin is negative for carcinoma. Selected slides were peer-reviewed by Dr. Venetia Night who agrees with the interpretation on part N.  Controls worked appropriately.  INTRAOPERATIVE DIAGNOSIS:  A1. LYMPH NODE, LEVEL 12 #2, FROZEN SECTION:         Negative for malignancy.        Rapid intraoperative consult diagnosis called to Dr. Dorris Fetch by Dr. Reynolds Bowl @ 1400 10/12/2023.  B1. LUNG, RIGHT UPPER LOBE NODULE, FROZEN SECTION:         Highly necrotic nodules with surrounding atypical pneumocyte proliferation.        Rapid intraoperative consult diagnosis called to Dr. Dorris Fetch by Dr. Reynolds Bowl @ 1428 10/12/2023.  B2. LUNG, RIGHT UPPER LOBE MARGIN, FROZEN SECTION:         Negative for malignancy.        Rapid intraoperative consult diagnosis called  to Dr. Dorris Fetch by Dr. Reynolds Bowl @ 1428 10/12/2023.  C1. LYMPH NODE, LEVEL 12 #3, FROZEN SECTION:         Negative for malignancy.        Rapid intraoperative consult diagnosis called to Dr. Dorris Fetch by Dr. Reynolds Bowl @ 1432 10/12/2023.  D1. LUNG, RIGHT MIDDLE LOBE BRONCHIAL MARGIN, FROZEN SECTION:         Negative for carcinoma.        Rapid intraoperative consult diagnosis called to Dr. Dorris Fetch by Dr. Reynolds Bowl @ 1531 10/12/2023.  D2. LUNG, RIGHT MIDDLE LOBE MASS, FROZEN SECTION:         Non-small cell carcinoma, favor adenocarcinoma.        Rapid intraoperative consult diagnosis called to Dr. Dorris Fetch by Dr. Reynolds Bowl @ 1531 10/12/2023.  D3. LUNG, RIGHT MIDDLE LOBE VASCULAR MARGINS, FROZEN SECTION:         Positive for carcinoma.        Rapid intraoperative consult diagnosis called to Dr. Dorris Fetch by Dr. Reynolds Bowl @ 1531 10/12/2023.  M1. VASCULAR MARGIN #1, FROZEN SECTION:       Negative for malignancy.      Rapid intraoperative consult diagnosis called to Dr. Dorris Fetch by Dr. Reynolds Bowl @ 1610 10/12/2023.  N1. VASCULAR MARGIN #2, FROZEN SECTION:       Negative for malignancy.      Rapid intraoperative consult diagnosis called to Dr. Dorris Fetch by Dr. Reynolds Bowl @ 9604 10/12/2023.  O1. VASCULAR MARGIN #3, FROZEN SECTION:       Atypical cells.      Rapid intraoperative consult diagnosis called to Dr. Dorris Fetch by Dr. Reynolds Bowl @ 5409 10/12/2023.  P1. VASCULAR MARGIN #4, FROZEN SECTION:       Negative for malignancy.      Rapid intraoperative consult diagnosis called to Dr. Dorris Fetch by Dr. Reynolds Bowl @ 8119 10/12/2023.  Q. VASCULAR MARGIN #5:       No tissue available.      Rapid intraoperative consult diagnosis called to Dr. Dorris Fetch by Dr. Reynolds Bowl @ 1478 10/12/2023.  R1. VASCULAR MARGIN #6, FROZEN SECTION:       Negative for malignancy.      Rapid intraoperative consult diagnosis called to Dr. Dorris Fetch by Dr. Reynolds Bowl @ 2956 10/12/2023.   GROSS DESCRIPTION:  Specimen A: Received fresh for rapid  intraoperative consult evaluation by frozen section and labeled "level 12 node #2" is a 0.5 x 0.4 x 0.3 cm anthracotic soft tissue, submitted in 1 block for frozen section.  Specimen B: Received fresh for rapid intraoperative consult evaluations of nodule and margin by frozen section is an 8 g, 5.7 x 3 x 2.6 cm wedge of lung tissue clinically identified as "right upper lobe wedge".  The pleura is tan-pink to pink-purple with slight anthracosis, is smooth, and has 2 staple lines, 1 with a short suture identifying false margin and 1 with long suture identifying true margin.  On sectioning, there is a 0.8 cm dark red ill-defined nodule and adjacent 0.6 cm similar nodule. The nodules are 1.7 cm from the true margin. Block summary: Block 1 = section of nodules, submitted for frozen section Block 2 = parenchyma adjacent to true margin, submitted for frozen section Block 3 = remaining nodules, submitted for routine histology  Specimen C: Received fresh for rapid intraoperative consult evaluation by frozen section and labeled "level 12 node #3" is a 1.4 x 1 x 0.4 cm anthracotic soft tissue which is submitted in 1 block for frozen section.  Specimen D: Received fresh for rapid intraoperative consult evaluations of bronchial margin, mass  and vascular margins by frozen section is a 58 g, 9.2 x 5 x 3.9 cm portion of lung clinically identified as "right middle lobe".  Pleura is tan-pink to dark red-purple with scattered adhesions.  Found directly beneath the hilum is a 3.2 x 3 x 2.5 cm tan-white firm ill-defined mass which comes within 0.5 cm of the bronchial margin and is grossly adjacent to the vascular margins. Uninvolved parenchyma is tan-pink to dark red, spongy.  No separately identified lymph nodes are found. Block summary: Block 1 = bronchial margin, submitted for frozen section Block 2 = section of mass, submitted for frozen section Block 3 = vascular margins, submitted for frozen  section Blocks 4-6 = sections of mass submitted for routine histology  Specimen E: Received fresh labeled "level 9 node" is a 0.7 x 0.4 x 0.3 cm aggregate of yellow-pink to anthracotic soft tissue, submitted 1 block.  Specimen F: Received fresh labeled "level 8 node" is a 0.9 x 0.8 x 0.3 cm yellow to anthracotic soft tissue, submitted in 1 block.  Specimen G: Received fresh labeled "level 7 node" is a 0.8 x 0.6 x 0.3 cm aggregate of anthracotic soft tissue, submitted in 1 block.  Specimen H: Received fresh labeled "level 10 node" is a 0.5 x 0.4 x 0.2 cm yellow to anthracotic soft tissue, submitted in 1 block.  Specimen I: Received fresh labeled "level 7 node #2" is a 1.8 x 1.6 x 0.4 cm aggregate of yellow-red to anthracotic soft tissue, submitted 1 block.  Specimen J: Received fresh labeled "level 10 node #2" is a 0.7 x 0.5 x 0.3 cm anthracotic soft tissue, submitted 1 block.  Specimen K: Received fresh labeled "level 10 node #3" is a 0.7 cm soft anthracotic nodule, in toto, one block.  Specimen L: Received fresh labeled "level 12 node" is a 0.7 x 0.7 x 0.3 cm aggregate of yellow-red to anthracotic soft tissue, submitted in 1 block.  Specimen M: Received fresh for rapid intraoperative consult evaluation by frozen section and labeled "vascular margin #1" is a minute gray-white soft tissue, submitted in 1 block for frozen section.  Specimen N: Received fresh for rapid intraoperative consult evaluation by frozen section and labeled "vascular margin #2" is a minute gray-white soft tissue, submitted in 1 block for frozen section.  Specimen O: Received fresh for rapid intraoperative consult evaluation by frozen section and labeled "vascular margin #3" is a 0.2 cm gray-white soft tissue, submitted in 1 block for frozen section.  Specimen P: Received fresh for rapid intraoperative consult evaluation by frozen section and labeled "vascular margin #4" is a 0.2 cm gray-white soft  tissue, submitted in 1 block for frozen section.  Specimen Q: Received labeled "vascular margin #5" is a container of saline with no grossly identifiable tissue.  Specimen R: Received fresh for rapid intraoperative consult evaluation by frozen section and labeled "vascular margin #6" is a 0.8 x 0.7 x 0.25 cm gray-white soft tissue with several embedded staples and a suture on new margin.  A section of tissue at the new margin is submitted in 1 block for frozen section.  SW 10/13/2023  Final Diagnosis performed by Lance Coon, MD.   Electronically signed 10/20/2023 Technical component performed at [facility]. [facility], [address], [place], [place] [address].  Professional component performed at [facility]. [address] [place], [place] [address]  Immunohistochemistry Technical component (if applicable) was performed at [employer]. [address],  [place], [place] [address].  IMMUNOHISTOCHEMISTRY DISCLAIMER (if applicable): Some of these immunohistochemical stains may have been developed and the performance characteristics determine by [facility]. Some may not have been cleared or approved by the U.S. Food and Drug Administration. The FDA has determined that such clearance or approval is not necessary. This test is used for clinical purposes. It should not be regarded as investigational or for research. This laboratory is certified under the Clinical Laboratory Improvement Amendments of 1988 (CLIA-88) as qualified to perform high complexity clinical laboratory testing.  The controls stained appropriately.  Resulting Agency [facility]        Specimen Collected: 10/12/23 13:00 Last Resulted: 10/20/23 14:52   MOLECULAR PATHOLOGY RESULT 10/12/2023:    RADIOGRAPHIC STUDIES: I have personally reviewed the radiological images as listed and agreed with the findings in the report. DG Chest 2  View Result Date: 11/21/2023 CLINICAL DATA:  Lung nodule. EXAM: CHEST - 2 VIEW COMPARISON:  October 31, 2023. FINDINGS: Stable cardiomediastinal silhouette. Hiatal hernia is again noted. Stable elongated nodular densities are noted in the right lung base. Postsurgical changes are also noted in the right mid and basilar regions. No pneumothorax is noted. Old right rib fractures are again noted. IMPRESSION: Stable hiatal hernia. Stable elongated nodular densities are noted in right lung base. Postsurgical changes are also noted in right lung. Electronically Signed   By: Lupita Raider M.D.   On: 11/21/2023 15:22   DG Chest 2 View Result Date: 10/31/2023 CLINICAL DATA:  Right lung nodule. EXAM: CHEST - 2 VIEW COMPARISON:  10/15/2023. FINDINGS: The heart size and mediastinal contours are within normal limits. There is atherosclerotic calcification of the aorta. Surgical changes are noted in the mid right lung. Multiple nodular opacities are noted in the right lung measuring up to 2.7 cm. There is blunting of the right costophrenic angle. No pneumothorax is seen. There is bony deformity of the ribs on the right. IMPRESSION: 1. Nodular densities in the right lung, not significantly changed from the prior exam. 2. Small right pleural effusion.  No pneumothorax is seen. Electronically Signed   By: Thornell Sartorius M.D.   On: 10/31/2023 10:56   MR Brain W Wo Contrast Result Date: 10/30/2023 CLINICAL DATA:  Provided history: Lung nodule. Metastatic disease evaluation. EXAM: MRI HEAD WITHOUT AND WITH CONTRAST TECHNIQUE: Multiplanar, multiecho pulse sequences of the brain and surrounding structures were obtained without and with intravenous contrast. CONTRAST:  6.5 mL Vueway intravenous contrast. COMPARISON:  Head CT 04/07/2020. FINDINGS: Brain: Mild generalized cerebral atrophy. 3 mm hypoenhancing or non-enhancing lesion within the pituitary gland (for instance as seen on series 117, image 12) (series 15, image 63). No  pathologic intracranial enhancement elsewhere. Multifocal T2 FLAIR hyperintense signal abnormality within the cerebral white matter, nonspecific but compatible with minimal chronic small vessel ischemic disease. No cortical encephalomalacia is identified. There is no acute infarct. No chronic intracranial blood products. No extra-axial fluid collection. No midline shift. Vascular: Maintained flow voids within the proximal large arterial vessels. Skull and upper cervical spine: No focal worrisome marrow lesion. Incompletely assessed cervical spondylosis. Sinuses/Orbits: No mass or acute finding within the imaged orbits. Prior bilateral ocular lens replacement. Mild mucosal thickening within the bilateral ethmoid, left sphenoid and bilateral maxillary sinuses. Impression #1 will be called to the ordering clinician or representative by the Radiologist Assistant, and communication documented in the PACS or [employer]. IMPRESSION: 1. 3 mm hypoenhancing or non-enhancing lesion within the pituitary gland. While this may  reflect a cyst or a microadenoma, a pituitary metastasis cannot be excluded. A short-interval follow-up brain MRI (with and without contrast) is recommended in 3 months to ensure stability. 2. No evidence of intracranial metastatic disease elsewhere. 3. Minimal chronic small vessel ischemic changes within the cerebral white matter. 4. Mild generalized cerebral atrophy. 5. Mild paranasal sinus mucosal thickening. Electronically Signed   By: Jackey Loge D.O.   On: 10/30/2023 15:14    ASSESSMENT & PLAN:   78 y.o. 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   Consistent with Light chain MGUS  Myeloma FISH panel with no notable genetic mutation.  2. Diagnosis of Adenocarcinoma, stage T2aN0: Discussed surgical pathology result from 10/12/2023 in detail with the patient. Showed right lung: Adenocarcinoma, acinar (90%), papillary (5%) and micropapillary (5%) patterns.        Tumor size: 3.2 cm.       Lymphovascular invasion identified.       Vascular margin is positive for carcinoma (see final margins).       Bronchial margin is negative for carcinoma.  -Discussed Molecular pathology results from 10/12/2023 in detail with the patient. Positive for EGFR mutation. Negative for HRD signature.  -Educated the patient about Adenocarcinoma.   3.  Hypercalcemia this has improved on repeat labs and is down to 10.5 with normal ionized calcium of 5.5. This could have been from dehydration but patient is also being worked up for plasma cell dyscrasia and also has an FDG avid right middle lobe lung lesion which is squamous cell carcinoma could also be a factor  4.  FDG avid right middle lobe lung lesion concerning for primary bronchogenic carcinoma.  No overt lymphadenopathy noted.  PLAN: --Discussed surgical pathology result from 10/12/2023 in detail with the patient. Showed right lung: Adenocarcinoma, acinar (90%), papillary (5%) and micropapillary (5%) patterns.       Tumor size: 3.2 cm.       Lymphovascular invasion identified.       Vascular margin is positive for carcinoma (see final margins).       Bronchial margin is negative for carcinoma.  -Discussed Molecular pathology results from 10/12/2023 in detail with the patient. Positive for EGFR mutation. Negative for HRD signature.  -Educated the patient about Adenocarcinoma.  -Discussed that the staging is T2aN0. -Discussed the Brain MRI results from 10/30/2023 in detail with the patient. Showed 3 mm hypoenhancing or non-enhancing lesion within the pituitary gland. While this may reflect a cyst or a microadenoma, a pituitary metastasis cannot be excluded. A short-interval follow-up brain MRI (with and without contrast) is recommended in 3 months to ensure stability. 2. No evidence of  intracranial metastatic disease elsewhere. 3. Minimal chronic small vessel schemic changes within the cerebral white matter. 4. Mild  generalized cerebral atrophy. 5. Mild paranasal sinus mucosal thickening. -Discussed with the patient that the 3 mm lesion in the pituitary gland is a incidental finding and not related to the Adenocarcinoma.  -Discussed with the patient that we will refer her to Neuro-oncologist, pt agrees.  -Refer the patient to Dr. Barbaraann Cao.  -Educated the patient regarding EGFR mutation.  -Discussed the treatment options for non-small cancer: adenocarcinoma. Treatment would be: targeted-therapy treatment of Tagrisso (Osimertinib). There is not indication to start chemotherapy treatment.  -Discussed with the patient that there is an option of no treatment with continued observation or targeted-therapy treatment. We would get an CT scan every 71-months if pt decides not to proceed with treatment.  -Discussed that the  duration of her treatment would be around 3-years.  -Educated the patient regarding the risks and benefits of Tagrisso (Osimertinib). -Patient and her family wants to proceed with the targeted-therapy treatment: Tagrisso (Osimertinib). -Will prescribe Tagrisso (Osimertinib). -Answered all of patient's questions regarding the molecular/surgical pathology results and treatment options.  -Discussed that the repeat Ct chest  in June/July 2025 -EKG today and Labs today.   . Orders Placed This Encounter  Procedures   CBC with Differential (Cancer Center Only)    Standing Status:   Future    Number of Occurrences:   1    Expected Date:   11/27/2023    Expiration Date:   11/26/2024   CMP (Cancer Center only)    Standing Status:   Future    Number of Occurrences:   1    Expected Date:   11/27/2023    Expiration Date:   11/26/2024   Magnesium    Standing Status:   Future    Number of Occurrences:   1    Expected Date:   11/27/2023    Expiration Date:   11/26/2024   Ferritin    Standing Status:   Future    Number of Occurrences:   1    Expected Date:   11/27/2023    Expiration Date:   11/26/2024   Iron  and Iron Binding Capacity ([facility] only)    Standing Status:   Future    Number of Occurrences:   1    Expected Date:   11/27/2023    Expiration Date:   11/26/2024   EKG 12-Lead    Ordered by an unspecified provider    EKG 12-Lead    Ordered by an unspecified provider    EKG 12-Lead    Ordered by an unspecified provider    EKG 12-Lead    Ordered by an unspecified provider     FOLLOW-UP: EKG and labs today RTC with Dr Candise Che with labs in 6 weeks  The total time spent in the appointment was 40 minutes* .  All of the patient's questions were answered with apparent satisfaction. The patient knows to call the clinic with any problems, questions or concerns.   Wyvonnia Lora MD MS AAHIVMS [facility] Hematology/Oncology Physician [facility]  .*Total Encounter Time as defined by the Centers for Medicare and Medicaid Services includes, in addition to the face-to-face time of a patient visit (documented in the note above) non-face-to-face time: obtaining and reviewing outside history, ordering and reviewing medications, tests or procedures, care coordination (communications with other health care professionals or caregivers) and documentation in the medical record.   I,Param Shah,acting as a Neurosurgeon for Wyvonnia Lora, MD.,have documented all relevant documentation on the behalf of Wyvonnia Lora, MD,as directed by  Wyvonnia Lora, MD while in the presence of Wyvonnia Lora, MD.  .I have reviewed the above documentation for accuracy and completeness, and I agree with the above. Johney Maine MD

## 2023-11-27 NOTE — Telephone Encounter (Signed)
 Oral Oncology Pharmacist Encounter  Received new prescription for Tagrisso (osimertinib) for the adjuvant treatment of EGFR mutated non-small cell lung cancer, planned duration for a total of 3 years or until disease progression or unacceptable toxicity.  Labs from 11/27/2023 assessed, no interventions needed. EKG from 11/27/23 completed and QTcB is 407. Prescription dose and frequency assessed for appropriateness.   Current medication list in Epic reviewed, DDIs with Tagrisso identified: - atorvastatin (cat C): tagrisso may increase the concentration of BCRP/ABCG2 substrates. Will notify patient and MD to monitor for increased effects of statins. - pantoprazole (cat B): PPIs may diminish the therapeutic effect of tagrisso. PPIs may also increase the concentration of tagrisso. No action is needed as it is category B.   Evaluated chart and no patient barriers to medication adherence noted.   Prescription has been e-scribed to the [facility] for benefits analysis and approval.  Oral Oncology Clinic will continue to follow for insurance authorization, copayment issues, initial counseling and start date.  Bethel Born, PharmD Hematology/Oncology Clinical Pharmacist [facility] [phone] 11/27/2023 2:24 PM

## 2023-11-27 NOTE — Telephone Encounter (Signed)
 A user error has taken place: encounter opened in error, closed for administrative reasons.

## 2023-11-29 ENCOUNTER — Other Ambulatory Visit: Payer: Self-pay | Admitting: Pharmacy Technician

## 2023-11-29 ENCOUNTER — Other Ambulatory Visit: Payer: Self-pay

## 2023-11-29 NOTE — Progress Notes (Signed)
 Specialty Pharmacy Initial Fill Coordination Note  Audrey [name] is a 78 y.o. female contacted today regarding refills of specialty medication(s) Osimertinib Mesylate (TAGRISSO) .  Patient requested Daryll Drown at [facility] at [place]  on 12/01/23   Medication will be filled on 11/30/23.   Patient is aware of $100 copayment.

## 2023-11-29 NOTE — Progress Notes (Signed)
 Patient counseled on Tagrisso in telephone encounter opened on 11/27/23.  Bethel Born, PharmD Hematology/Oncology Clinical Pharmacist [facility] [phone]

## 2023-11-29 NOTE — Telephone Encounter (Addendum)
 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   Counseled patient on administration, dosing, side effects, monitoring, drug-food interactions, safe handling, storage, and disposal.  Patient will take Tagrisso 80 tablets, 1 tablet by mouth once daily, without regard to food.  Tagrisso start date: 12/04/2023  Adverse effects include but are not limited to: diarrhea, mouth sores, fatigue, dry skin, rash, nail changes, altered cardiac conduction, and decreased blood counts or electrolytes.  Patient will obtain anti diarrheal and alert the office of 4 or more loose stools above baseline.   Reviewed with patient importance of keeping a medication schedule and plan for any missed doses. No barriers to medication adherence identified.   Medication reconciliation performed and medication/allergy list updated. Patient informed the pharmacy will reach out 5-7 days prior to needing next fill of Tagrisso to coordinate continued medication acquisition to prevent break in therapy.  All questions answered. Patient voiced understanding and appreciation. Patient does not have appointments added at this time yet.   Medication education handout placed in mail for patient. Patient knows to call the office with questions or concerns. Oral Chemotherapy Clinic phone number provided to patient.   Bethel Born, PharmD Hematology/Oncology Clinical Pharmacist [facility]otherapy [facility] [phone] 11/29/2023   11:05 AM

## 2023-11-30 ENCOUNTER — Telehealth: Payer: Self-pay | Admitting: Hematology

## 2023-11-30 ENCOUNTER — Other Ambulatory Visit: Payer: Self-pay

## 2023-11-30 NOTE — Telephone Encounter (Signed)
 Left patient a vm regarding upcoming appointment

## 2023-12-07 ENCOUNTER — Other Ambulatory Visit: Payer: Self-pay

## 2023-12-07 ENCOUNTER — Inpatient Hospital Stay: Attending: Hematology | Admitting: Internal Medicine

## 2023-12-07 ENCOUNTER — Inpatient Hospital Stay: Attending: Hematology

## 2023-12-07 VITALS — BP 134/80 | HR 64 | Temp 98.0°F | Resp 18 | Ht 62.0 in | Wt 158.0 lb

## 2023-12-07 DIAGNOSIS — Z79899 Other long term (current) drug therapy: Secondary | ICD-10-CM | POA: Diagnosis not present

## 2023-12-07 DIAGNOSIS — E237 Disorder of pituitary gland, unspecified: Secondary | ICD-10-CM | POA: Insufficient documentation

## 2023-12-07 DIAGNOSIS — E236 Other disorders of pituitary gland: Secondary | ICD-10-CM | POA: Diagnosis not present

## 2023-12-07 LAB — CORTISOL: Cortisol, Plasma: 2.7 ug/dL

## 2023-12-07 NOTE — Progress Notes (Signed)
 [facility] at [facility] [address]  [place], [place] [address] [phone]   New Patient Evaluation  Date of Service: 12/07/23 Patient Name: HANIA CERONE Patient MRN: [id number] Patient DOB: 03-Feb-1946 Provider: Henreitta Leber, MD  Identifying Statement:  MAIZEY MENENDEZ is a 78 y.o. 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    Referring Provider: Merri Brunette, MD [address] [place],  [place] [address]  Primary Cancer:  Oncologic History: Oncology History   No history exists.    History of Present Illness: The patient's records from the referring physician were obtained and reviewed and the patient interviewed to confirm this HPI.  Chevis Pretty presents for evaluation following recent abnormal MRI brain.  She has started on Tagrisso for lung cancer through Dr. Candise Che.  Denies any neurologic symptoms.  No headaches, seizures.  Remains functionally independent.  Medications: Current Outpatient Medications on File Prior to Visit  Medication Sig Dispense Refill   acetaminophen (TYLENOL) 500 MG tablet Take 1-2 tablets (500-1,000 mg total) by mouth every 6 (six) hours as needed.     atorvastatin (LIPITOR) 40 MG tablet Take 40 mg by mouth daily.     Cholecalciferol (VITAMIN D) 50 MCG (2000 UT) tablet Take 2,000 Units by mouth daily.     FLUoxetine (PROZAC) 20 MG capsule Take 20 mg by mouth daily.     gabapentin (NEURONTIN) 300 MG capsule Take 1 tablet (300 mg) in the morning and afternoon.  Take 2 tablets (600 mg) at bedtime 120 capsule 3   guaiFENesin (MUCINEX) 600 MG 12 hr tablet Take 1 tablet (600 mg total) by mouth 2 (two) times daily as needed for to loosen phlegm or cough.     levothyroxine (SYNTHROID, LEVOTHROID) 25 MCG tablet TAKE 1 TABLET BY MOUTH BEFORE BREAKFAST (Patient taking differently: Take 25 mcg by mouth daily  before breakfast.) 90 tablet 3   methocarbamol (ROBAXIN) 500 MG tablet Take 1 tablet (500 mg total) by mouth 3 (three) times daily as needed for muscle spasms. 90 tablet 0   Multiple Vitamins-Minerals (PRESERVISION AREDS) CAPS Take 1 capsule by mouth in the morning and at bedtime.     naproxen sodium (ALEVE) 220 MG tablet Take 220 mg by mouth daily as needed (pain).     nystatin-triamcinolone (MYCOLOG II) cream Apply 1 application topically daily as needed (Vaginal itcing).      osimertinib mesylate (TAGRISSO) 80 MG tablet Take 1 tablet (80 mg total) by mouth daily. 30 tablet 11   pantoprazole (PROTONIX) 40 MG tablet Take 40 mg by mouth daily.     SYSTANE ULTRA 0.4-0.3 % SOLN Place 1 drop into both eyes in the morning and at bedtime.     No current facility-administered medications on file prior to visit.    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; Dr Yetta Barre   Fracture    right 4th and 5th metacarpal   GERD (gastroesophageal reflux disease)    Hearing aid worn    B/L   History of hiatal hernia    HOH (hard of hearing)    Hyperlipidemia    Hypothyroidism  Osteopenia after menopause    Pneumonia    Uterine prolapse    Wears glasses    Past Surgical History:  Past Surgical History:  Procedure Laterality Date   CATARACT EXTRACTION Bilateral    COLONOSCOPY  09/05/2010   neg; [facility]   HEMORRHOID SURGERY     INTERCOSTAL NERVE BLOCK Right 10/12/2023   Procedure: INTERCOSTAL NERVE BLOCK;  Surgeon: Loreli Slot, MD;  Location: [facility] OR;  Service: Thoracic;  Laterality: Right;   NODE DISSECTION Right 10/12/2023   Procedure: NODE DISSECTION;  Surgeon: Loreli Slot, MD;  Location: [facility] OR;  Service: Thoracic;  Laterality: Right;   OPEN REDUCTION INTERNAL FIXATION  (ORIF) METACARPAL Right 04/10/2020   Procedure: OPEN REDUCTION INTERNAL FIXATION (ORIF)  AS NECESSARY RIGHT FOURTH AND FIFTH METACARPAL FRACTURES;  Surgeon: Dominica Severin, MD;  Location: [facility] OR;  Service: Orthopedics;  Laterality: Right;  OPEN REDUCTION INTERNAL FIXATION (ORIF)  AS NECESSARY RIGHT FOURTH AND FIFTH METACARPAL FRACTURES   Social History:  Social History   Socioeconomic History   Marital status: Married    Spouse name: Not on file   Number of children: 2   Years of education: Not on file   Highest education level: Not on file  Occupational History   Not on file  Tobacco Use   Smoking status: Never   Smokeless tobacco: Never  Vaping Use   Vaping status: Never Used  Substance and Sexual Activity   Alcohol use: No    Alcohol/week: 0.0 standard drinks of alcohol   Drug use: No   Sexual activity: Not on file  Other Topics Concern   Not on file  Social History Narrative   Lives with spouse;   One son in [place]   Dtr in [place]   2 grand children   2 twin girl; in [place] in July (11)   Social Drivers of Health   Financial Resource Strain: Low Risk  (07/12/2018)   Overall Financial Resource Strain (CARDIA)    Difficulty of Paying Living Expenses: Not hard at all  Food Insecurity: No Food Insecurity (10/14/2023)   Hunger Vital Sign    Worried About Running Out of Food in the Last Year: Never true    Ran Out of Food in the Last Year: Never true  Transportation Needs: No Transportation Needs (10/14/2023)   PRAPARE - Administrator, Civil Service (Medical): No    Lack of Transportation (Non-Medical): No  Physical Activity: Inactive (07/12/2018)   Exercise Vital Sign    Days of Exercise per Week: 0 days    Minutes of Exercise per Session: 0 min  Stress: No Stress Concern Present (07/12/2018)   [employer] of Occupational Health - Occupational Stress Questionnaire    Feeling of Stress : Not at all  Social Connections: Unknown (10/15/2023)   Social  Connection and Isolation Panel [NHANES]    Frequency of Communication with Friends and Family: More than three times a week    Frequency of Social Gatherings with Friends and Family: More than three times a week    Attends Religious Services: Patient declined    Database administrator or Organizations: Patient declined    Attends Banker Meetings: Patient declined    Marital Status: Married  Catering manager Violence: Not At Risk (10/14/2023)   Humiliation, Afraid, Rape, and Kick questionnaire    Fear of Current or Ex-Partner: No    Emotionally Abused: No    Physically Abused: No  Sexually Abused: No   Family History:  Family History  Problem Relation Age of Onset   Heart attack Mother        in 58s   Uterine cancer Mother    Heart attack Father        in 34s   Coronary artery disease Brother        stent in 25s   Other Brother        ? leukemia   Stroke Maternal Grandmother    Heart attack Maternal Grandfather        in 6s   Diabetes Neg Hx    Colon cancer Neg Hx    Esophageal cancer Neg Hx    Rectal cancer Neg Hx    Stomach cancer Neg Hx     Review of Systems: Constitutional: Doesn't report fevers, chills or abnormal weight loss Eyes: Doesn't report blurriness of vision Ears, nose, mouth, throat, and face: Doesn't report sore throat Respiratory: Doesn't report cough, dyspnea or wheezes Cardiovascular: Doesn't report palpitation, chest discomfort  Gastrointestinal:  Doesn't report nausea, constipation, diarrhea GU: Doesn't report incontinence Skin: Doesn't report skin rashes Neurological: Per HPI Musculoskeletal: Doesn't report joint pain Behavioral/Psych: Doesn't report anxiety  Physical Exam: Vitals:   12/07/23 1530  BP: 134/80  Pulse: 64  Resp: 18  Temp: 98 F (36.7 C)  SpO2: 98%   KPS: 90. General: Alert, cooperative, pleasant, in no acute distress Head: Normal EENT: No conjunctival injection or scleral icterus.  Lungs: Resp effort  normal Cardiac: Regular rate Abdomen: Non-distended abdomen Skin: No rashes cyanosis or petechiae. Extremities: No clubbing or edema  Neurologic Exam: Mental Status: Awake, alert, attentive to examiner. Oriented to self and environment. Language is fluent with intact comprehension.  Cranial Nerves: Visual acuity is grossly normal. Visual fields are full. Extra-ocular movements intact. No ptosis. Face is symmetric Motor: Tone and bulk are normal. Power is full in both arms and legs. Reflexes are symmetric, no pathologic reflexes present.  Sensory: Intact to light touch Gait: Normal.   Labs: I have reviewed the data as listed    Component Value Date/Time   NA 141 11/27/2023 1115   K 4.1 11/27/2023 1115   CL 108 11/27/2023 1115   CO2 26 11/27/2023 1115   GLUCOSE 96 11/27/2023 1115   BUN 15 11/27/2023 1115   CREATININE 0.63 11/27/2023 1115   CALCIUM 10.6 (H) 11/27/2023 1115   PROT 7.0 11/27/2023 1115   ALBUMIN 4.2 11/27/2023 1115   AST 16 11/27/2023 1115   ALT 14 11/27/2023 1115   ALKPHOS 98 11/27/2023 1115   BILITOT 0.3 11/27/2023 1115   GFRNONAA >60 11/27/2023 1115   Lab Results  Component Value Date   WBC 6.3 11/27/2023   NEUTROABS 3.7 11/27/2023   HGB 11.9 (L) 11/27/2023   HCT 37.0 11/27/2023   MCV 92.3 11/27/2023   PLT 274 11/27/2023    Imaging:  DG Chest 2 View Result Date: 11/21/2023 CLINICAL DATA:  Lung nodule. EXAM: CHEST - 2 VIEW COMPARISON:  October 31, 2023. FINDINGS: Stable cardiomediastinal silhouette. Hiatal hernia is again noted. Stable elongated nodular densities are noted in the right lung base. Postsurgical changes are also noted in the right mid and basilar regions. No pneumothorax is noted. Old right rib fractures are again noted. IMPRESSION: Stable hiatal hernia. Stable elongated nodular densities are noted in right lung base. Postsurgical changes are also noted in right lung. Electronically Signed   By: Lupita Raider M.D.   On: 11/21/2023 15:22  [facility] Clinician Interpretation: I have personally reviewed the radiological images as listed.  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  Metastasis is less likely given lack of enhancement and size.  Regardless, we do recommend repeating a pituitary protocol study in 3 months to rule out frank metastasis.    Will check TSH, prolactin, growth hormone, FSH/LH, cortisol in the serum.  MRI can be scheduled for June.  She will con't Tagrisso and follow up with [facility] in the interim.  We spent twenty additional minutes teaching regarding the natural history, biology, and historical experience in the treatment of neurologic complications of cancer.   We appreciate the opportunity to participate in the care of ANAHIT KLUMB.   We ask that HOWARD PATTON return to clinic in 3 months via phone following next brain MRI, or sooner as needed.  All questions were answered. The patient knows to call the clinic with any problems, questions or concerns. No barriers to learning were detected.  The total time spent in the encounter was 40 minutes and more than 50% was on counseling and review of test results   Henreitta Leber, MD Medical Director of Neuro-Oncology [facility] 12/07/23 3:45 PM

## 2023-12-08 ENCOUNTER — Telehealth: Payer: Self-pay | Admitting: Internal Medicine

## 2023-12-08 LAB — PROLACTIN: Prolactin: 18.9 ng/mL (ref 3.6–25.2)

## 2023-12-08 LAB — LUTEINIZING HORMONE: LH: 15.8 m[IU]/mL (ref 7.7–58.5)

## 2023-12-08 LAB — INSULIN-LIKE GROWTH FACTOR: Somatomedin C: 93 ng/mL (ref 42–185)

## 2023-12-08 LAB — FOLLICLE STIMULATING HORMONE: FSH: 43.1 m[IU]/mL (ref 25.8–134.8)

## 2023-12-08 LAB — TSH: TSH: 2.7 u[IU]/mL (ref 0.350–4.500)

## 2023-12-08 NOTE — Telephone Encounter (Signed)
 Patient scheduled appointments. Patient is aware of all appointment details.

## 2023-12-11 ENCOUNTER — Encounter: Payer: Self-pay | Admitting: Hematology

## 2023-12-11 NOTE — Progress Notes (Signed)
 Contacted pt's Daughter-in-Law regarding MyChart message. Instructed pt to take imodium for a few days and if there is no change or pt is still having frequent loose stools , pt is to notify Dr Candise Che. Per Dr Candise Che pt may have to stop medication, Tagrisso, for a period until diarrhea stops. Pt's daughter in law verbalized understanding.

## 2023-12-18 DIAGNOSIS — M810 Age-related osteoporosis without current pathological fracture: Secondary | ICD-10-CM | POA: Diagnosis not present

## 2023-12-18 DIAGNOSIS — F419 Anxiety disorder, unspecified: Secondary | ICD-10-CM | POA: Diagnosis not present

## 2023-12-18 DIAGNOSIS — E7800 Pure hypercholesterolemia, unspecified: Secondary | ICD-10-CM | POA: Diagnosis not present

## 2023-12-18 DIAGNOSIS — C3491 Malignant neoplasm of unspecified part of right bronchus or lung: Secondary | ICD-10-CM | POA: Diagnosis not present

## 2023-12-18 DIAGNOSIS — E237 Disorder of pituitary gland, unspecified: Secondary | ICD-10-CM | POA: Diagnosis not present

## 2023-12-18 DIAGNOSIS — E039 Hypothyroidism, unspecified: Secondary | ICD-10-CM | POA: Diagnosis not present

## 2023-12-18 DIAGNOSIS — D472 Monoclonal gammopathy: Secondary | ICD-10-CM | POA: Diagnosis not present

## 2023-12-18 DIAGNOSIS — K219 Gastro-esophageal reflux disease without esophagitis: Secondary | ICD-10-CM | POA: Diagnosis not present

## 2023-12-19 ENCOUNTER — Other Ambulatory Visit (HOSPITAL_COMMUNITY): Payer: Self-pay

## 2023-12-19 NOTE — Progress Notes (Signed)
 Specialty Pharmacy Refill Coordination Note  Audrey [name] is a 78 y.o. female contacted today regarding refills of specialty medication(s) Osimertinib Mesylate (TAGRISSO)   Patient requested [name] [place] date: 12/28/23   Medication will be filled on 12/27/23.

## 2023-12-19 NOTE — Progress Notes (Signed)
 Specialty Pharmacy Ongoing Clinical Assessment Note  Audrey [name] is a 78 y.o. female who is being followed by the specialty pharmacy service for RxSp Oncology   Patient's specialty medication(s) reviewed today: Osimertinib Mesylate (TAGRISSO)   Missed doses in the last 4 weeks: 0   Patient/Caregiver did not have any additional questions or concerns.   Therapeutic benefit summary: Unable to assess   Adverse events/side effects summary: No adverse events/side effects   Patient's therapy is appropriate to: Continue    Goals Addressed             This Visit's Progress    Maintain optimal adherence to therapy   On track    Patient is initiating therapy. Patient will maintain adherence         Follow up:  3 months  Malachi Screws Specialty Pharmacist

## 2023-12-27 ENCOUNTER — Other Ambulatory Visit: Payer: Self-pay

## 2024-01-11 ENCOUNTER — Other Ambulatory Visit: Payer: Self-pay

## 2024-01-11 DIAGNOSIS — D472 Monoclonal gammopathy: Secondary | ICD-10-CM

## 2024-01-11 NOTE — Progress Notes (Signed)
 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   HISTORY OF PRESENTING ILLNESS:   Audrey [name] is a wonderful 78 y.o. female who has been referred to us  by Jearldine Mina, MD for evaluation and management of M spike on UPEP and hypercalcemia.  Today she is accompanied by her husband and daughter-in-law, who help provide history. Patient reports that her PCP is Dr. Felipe Horton. Patient reports that her calcium  has been elevated over the last couple of months.   She denies any lumps/bumps, fever, chills, night sweats, unexplained weight loss, or night sweats. Patient reports normal eating habits. Patient was noted to be hard of hearing.   She complains of generalized aching after activity present over the last couple of years. If she rests, her symptoms resolve. She denies any  localized discomfort to a particular area. She denies any spine pain with palpation.  Patient has had falls in the past and has had compression fractures in the spine with pain in those areas. No lytics lesions were found on imaging. Patient reports that she last received imaging of the back 6-12 months ago.   She reports that she cannot lay on her right side as pressure to her hip causes pain. This symptom has been present  for a while and is stable.   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 Patient denies any major inctions such as COVID-19 or flu.  Patient has no known kidney disease or autoimmune conditions such as RA or lupus. Her daughter-in-law r(Dr Grisel Travassos) reports that patient's son has Hoshimoto's thyroiditis and it is possible that patient may also have it. Patient does take thyroid  medication.   Patient reports that she was recently diagnosed with  Osteoporosis and has not yet started treatment for it. Her daughter-in-law reports that patient is currently in the prior authorization stage for Prolia. Patient did use Fosamax a while ago.   She complains of brain fog, balance issues, and mild dizziness. Patient has had balance issues for several years. She has tried  balance physical therapy in the past.   Patient reports that she is constantly cold.   She reports that she is currently in the beginning stages of macular degeneration.   She generally does not drink any water and does drink caffeine regularly.   INTERVAL HISTORY:  Audrey [name] is a wonderful 78 y.o. 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   Patient was last seen by me on 11/27/2023 and was doing well overall.   She was seen by Dr. Mark Sil on 12/07/2023. Per Dr. Boris Byars note: Etiology is favored microadenoma, vs cyst. Metastasis is less likely given lack of enhancement and size. Regardless, we do recommend repeating a pituitary protocol study in 3 months to rule out frank metastasis."  Today, she is accompanied by her husband. Patient reports that she has been on Tagrisso  for 1 month and complains of diarrhea. She continues to be on Tagrisso  at this time.   Patient denies any concern for diarrhea prior to being on Tagrisso .   Patient has only needed to take immodium 3-4 times. She is not taking Imodium on a daily basis. Patient reports an episode of having 6-7 bowel movements  daily with watery stools prior to taking Imodium.  Her dairrhea is spontaneous though it has not been bothersome on a daily basis. Patient's diarrhea is generally bothersome every 1.5 weeks, which is generally how often she takes an Imodium. She reports that her diarrhea has not gotten progressively worse.   She reports that her Tagrisso  was recently refilled.   Patient denies any skin  rashes. Patient noted to have had mouth sores even prior to Tigresso. Her mouth sores may have mildly worsened since being on medication, though her symptoms are improved at this time. She also reports having dry mouth before starting the medication.   She reports low energy and chronic back pain.   Patient complains of sensitive skin around area of her surgery in the right abdomen. She reports that she is working on de-sensitizing it, which has helped to some extent.   She denies any infection issues.   She reports that deep breathing causes coughing. Siting up straight improves symptoms.   She denies any new lumps/bumps, back pain, abdominal pain, or leg swelling.  Patient received robotic-assisted Thoracoscopy in early February.   Patient was seen by Dr. Mark Sil earlier in April and he is planning to repeat MRI in a few months prior to July. Hormonal blood testing showed no obviously elevated hormones.   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; Dr Rochelle Chu   Fracture    right 4th and 5th metacarpal   GERD (gastroesophageal reflux disease)    Hearing aid worn    B/L   History of hiatal hernia    HOH (hard of hearing)    Hyperlipidemia    Hypothyroidism    Osteopenia after menopause    Pneumonia    Uterine prolapse    Wears glasses     SURGICAL HISTORY: Past Surgical History:  Procedure Laterality Date   CATARACT EXTRACTION Bilateral    COLONOSCOPY  09/05/2010   neg; [facility]   HEMORRHOID SURGERY     INTERCOSTAL NERVE BLOCK Right 10/12/2023   Procedure: INTERCOSTAL NERVE BLOCK;  Surgeon: Zelphia Higashi, MD;  Location: [facility] OR;  Service: Thoracic;  Laterality: Right;   NODE DISSECTION Right 10/12/2023   Procedure: NODE DISSECTION;  Surgeon: Zelphia Higashi, MD;  Location: [facility] OR;  Service: Thoracic;  Laterality: Right;   OPEN REDUCTION INTERNAL FIXATION (ORIF) METACARPAL Right  04/10/2020   Procedure: OPEN REDUCTION INTERNAL FIXATION (ORIF)  AS NECESSARY RIGHT FOURTH AND FIFTH METACARPAL FRACTURES;  Surgeon: Ronn Cohn, MD;  Location: [facility] OR;  Service: Orthopedics;  Laterality: Right;  OPEN REDUCTION INTERNAL FIXATION (ORIF)  AS NECESSARY RIGHT FOURTH AND FIFTH METACARPAL FRACTURES    SOCIAL HISTORY: Social History   Socioeconomic History   Marital status: Married    Spouse name: Not on file   Number of children: 2   Years of education: Not on file   Highest education level: Not on file  Occupational History   Not on file  Tobacco Use   Smoking status: Never   Smokeless tobacco: Never  Vaping Use   Vaping status: Never Used  Substance and Sexual Activity   Alcohol  use: No    Alcohol /week: 0.0 standard drinks of alcohol    Drug use: No   Sexual activity: Not on file  Other Topics Concern   Not on file  Social History Narrative   Lives with spouse;   One  son in [place]   Dtr in [place]   2 grand children   2 twin girl; in [place] in July (11)   Social Drivers of Health   Financial Resource Strain: Low Risk  (07/12/2018)   Overall Financial Resource Strain (CARDIA)    Difficulty of Paying Living Expenses: Not hard at all  Food Insecurity: No Food Insecurity (10/14/2023)   Hunger Vital Sign    Worried About Running Out of Food in the Last Year: Never true    Ran Out of Food in the Last Year: Never true  Transportation Needs: No Transportation Needs (10/14/2023)   PRAPARE - Administrator, Civil Service (Medical): No    Lack of Transportation (Non-Medical): No  Physical Activity: Inactive (07/12/2018)   Exercise Vital Sign    Days of Exercise per Week: 0 days    Minutes of Exercise per Session: 0 min  Stress: No Stress Concern Present (07/12/2018)   [employer] of Occupational Health - Occupational Stress Questionnaire    Feeling of Stress : Not at all  Social Connections: Unknown (10/15/2023)   Social Connection and Isolation  Panel [NHANES]    Frequency of Communication with Friends and Family: More than three times a week    Frequency of Social Gatherings with Friends and Family: More than three times a week    Attends Religious Services: Patient declined    Database administrator or Organizations: Patient declined    Attends Banker Meetings: Patient declined    Marital Status: Married  Catering manager Violence: Not At Risk (10/14/2023)   Humiliation, Afraid, Rape, and Kick questionnaire    Fear of Current or Ex-Partner: No    Emotionally Abused: No    Physically Abused: No    Sexually Abused: No    FAMILY HISTORY: Family History  Problem Relation Age of Onset   Heart attack Mother        in 42s   Uterine cancer Mother    Heart attack Father        in 75s   Coronary artery disease Brother        stent in 74s   Other Brother        ? leukemia   Stroke Maternal Grandmother    Heart attack Maternal Grandfather        in 70s   Diabetes Neg Hx    Colon cancer Neg Hx    Esophageal cancer Neg Hx    Rectal cancer Neg Hx    Stomach cancer Neg Hx     ALLERGIES:  is allergic to celecoxib and sulfonamide derivatives.  MEDICATIONS:  Current Outpatient Medications  Medication Sig Dispense Refill   acetaminophen  (TYLENOL ) 500 MG tablet Take 1-2 tablets (500-1,000 mg total) by mouth every 6 (six) hours as needed.     atorvastatin  (LIPITOR) 40 MG tablet Take 40 mg by mouth daily.     Cholecalciferol (VITAMIN D ) 50 MCG (2000 UT) tablet Take 2,000 Units by mouth daily.     FLUoxetine  (PROZAC ) 20 MG capsule Take 20 mg by mouth daily.     gabapentin  (NEURONTIN ) 300 MG capsule Take 1 tablet (300 mg) in the morning and afternoon.  Take 2 tablets (600 mg) at bedtime 120 capsule 3   guaiFENesin  (MUCINEX ) 600 MG 12 hr tablet Take 1 tablet (600 mg total) by mouth 2 (two) times daily as needed for to loosen phlegm or cough.     levothyroxine  (SYNTHROID , LEVOTHROID) 25 MCG tablet  TAKE 1 TABLET BY MOUTH  BEFORE BREAKFAST (Patient taking differently: Take 25 mcg by mouth daily before breakfast.) 90 tablet 3   methocarbamol  (ROBAXIN ) 500 MG tablet Take 1 tablet (500 mg total) by mouth 3 (three) times daily as needed for muscle spasms. 90 tablet 0   Multiple Vitamins-Minerals (PRESERVISION AREDS) CAPS Take 1 capsule by mouth in the morning and at bedtime.     naproxen sodium (ALEVE) 220 MG tablet Take 220 mg by mouth daily as needed (pain).     nystatin-triamcinolone (MYCOLOG II) cream Apply 1 application topically daily as needed (Vaginal itcing).      osimertinib  mesylate (TAGRISSO ) 80 MG tablet Take 1 tablet (80 mg total) by mouth daily. 30 tablet 11   pantoprazole  (PROTONIX ) 40 MG tablet Take 40 mg by mouth daily.     SYSTANE ULTRA 0.4-0.3 % SOLN Place 1 drop into both eyes in the morning and at bedtime.     No current facility-administered medications for this visit.    REVIEW OF SYSTEMS:    10 Point review of Systems was done is negative except as noted above.   PHYSICAL EXAMINATION:  .There were no vitals taken for this visit.   GENERAL:alert, in no acute distress and comfortable SKIN: no acute rashes, no significant lesions EYES: conjunctiva are pink and non-injected, sclera anicteric OROPHARYNX: MMM, no exudates, no oropharyngeal erythema or ulceration NECK: supple, no JVD LYMPH:  no palpable lymphadenopathy in the cervical, axillary or inguinal regions LUNGS: clear to auscultation b/l with normal respiratory effort HEART: regular rate & rhythm ABDOMEN:  normoactive bowel sounds , non tender, not distended. Extremity: no pedal edema PSYCH: alert & oriented x 3 with fluent speech NEURO: no focal motor/sensory deficits   LABORATORY DATA:  I have reviewed the data as listed                .            Latest Ref Rng & Units 11/27/2023   11:15 AM 10/14/2023    2:09 AM 10/13/2023    2:10 AM  CBC  WBC 4.0 - 10.5 K/uL 6.3  9.3  11.9   Hemoglobin 12.0 - 15.0  g/dL 40.9  81.1  91.4   Hematocrit 36.0 - 46.0 % 37.0  32.3  33.9   Platelets 150 - 400 K/uL 274  208  226     .    Latest Ref Rng & Units 11/27/2023   11:15 AM 10/14/2023    2:09 AM 10/13/2023    2:10 AM  CMP  Glucose 70 - 99 mg/dL 96  782  956   BUN 8 - 23 mg/dL 15  14  10    Creatinine 0.44 - 1.00 mg/dL 2.13  0.86  5.78   Sodium 135 - 145 mmol/L 141  138  138   Potassium 3.5 - 5.1 mmol/L 4.1  4.1  4.3   Chloride 98 - 111 mmol/L 108  108  107   CO2 22 - 32 mmol/L 26  22  22    Calcium  8.9 - 10.3 mg/dL 46.9  9.6  9.9   Total Protein 6.5 - 8.1 g/dL 7.0  5.5    Total Bilirubin 0.0 - 1.2 mg/dL 0.3  0.6    Alkaline Phos 38 - 126 U/L 98  60    AST 15 - 41 U/L 16  26    ALT 0 - 44 U/L 14  12     Component  Latest Ref Rng 08/12/2023 08/16/2023  Total Protein, Urine-UPE24     Not Estab. mg/dL  8.5   Total Protein, Urine-Ur/day     30 - 150 mg/24 hr  106   ALBUMIN , U     %  23.3   ALPHA 1 URINE     %  6.7   Alpha 2, Urine     %  19.4   % BETA, Urine     %  21.6   GAMMA GLOBULIN URINE     %  28.9   Free Kappa Lt Chains,Ur     1.17 - 86.46 mg/L  146.10 (H)   Free Lambda Lt Chains,Ur     0.27 - 15.21 mg/L  6.36 (C)  Free Kappa/Lambda Ratio     1.83 - 14.26   22.97 (H) (C)  Immunofixation Result, Urine  Comment ! (C)  Total Volume  1,250   M-SPIKE %, Urine     Not Observed %  14.3 (H) (C)  M-Spike, mg/24 hr     Not Observed mg/24 hr  15 (H) (C)  NOTE:  Comment (C)  IgG (Immunoglobin G), Serum     586 - 1,602 mg/dL 161    IgA     64 - 096 mg/dL 95    IgM (Immunoglobulin M), Srm     26 - 217 mg/dL 70    Total Protein ELP     6.0 - 8.5 g/dL 6.4 (C)   Albumin  SerPl Elph-Mcnc     2.9 - 4.4 g/dL 3.6 (C)   Alpha 1     0.0 - 0.4 g/dL 0.2 (C)   Alpha2 Glob SerPl Elph-Mcnc     0.4 - 1.0 g/dL 0.7 (C)   B-Globulin SerPl Elph-Mcnc     0.7 - 1.3 g/dL 1.0 (C)   Gamma Glob SerPl Elph-Mcnc     0.4 - 1.8 g/dL 0.8 (C)   M Protein SerPl Elph-Mcnc     Not Observed g/dL Not  Observed (C)   Globulin, Total     2.2 - 3.9 g/dL 2.8 (C)   Albumin /Glob SerPl     0.7 - 1.7  1.3 (C)   IFE 1 Comment (C)   Please Note (HCV): Comment (C)   Kappa free light chain     3.3 - 19.4 mg/L 113.0 (H)    Lambda free light chains     5.7 - 26.3 mg/L 13.0    Kappa, lambda light chain ratio     0.26 - 1.65  8.69 (H)    Calcium , Ionized, Serum     4.5 - 5.6 mg/dL 5.5    LDH     98 - 045 U/L 186    Sed Rate     0 - 22 mm/hr 7    Beta-2  Microglobulin     0.6 - 2.4 mg/L 1.8      Legend: (H) High ! Abnormal (C) Corrected   SURGICAL PATHOLOGY RESULT from 10/12/2023:    Results Surgical pathology (Order [id number]) Surgical pathology Order: [id number]  Status: Edited Result - FINAL     Next appt: 01/22/2024 at 10:30 AM in Oncology ([facility])   Test Result Released: Yes (seen)   0 Result Notes    Component Ref Range & Units (hover) 1 mo ago  SURGICAL PATHOLOGY SURGICAL PATHOLOGY CASE: [id number] PATIENT: Arvid Birk Surgical Pathology Report     Clinical History: right middle lobe lung mass (cf)     FINAL MICROSCOPIC DIAGNOSIS:  A. LYMPH NODE, LEVEL 12 #2, EXCISION:      One lymph node, negative for metastatic carcinoma (0/1).  B. LUNG, RIGHT UPPER LOBE WEDGE RESECTION:      Benign pulmonary parenchyma with hemorrhage and necrosis.      Negative for malignancy.  C. LYMPH NODE, LEVEL 12 #3, EXCISION:      One lymph node, negative for metastatic carcinoma (0/1).  D. LUNG, RIGHT MIDDLE LOBECTOMY:      Adenocarcinoma, acinar (90%), papillary (5%) and micropapillary (5%) patterns.      Tumor size: 3.2 cm.      Lymphovascular invasion identified.      Vascular margin is positive for carcinoma (see final margins).      Bronchial margin is negative for carcinoma.      See oncology table.  E. LYMPH NODE, LEVEL 9, EXCISION:      One lymph node, negative for metastatic carcinoma (0/1).  F. LYMPH NODE, LEVEL 8, EXCISION:      One lymph  node, negative for metastatic carcinoma (0/1).  G. LYMPH NODE, LEVEL 7, EXCISION:      One lymph node, negative for metastatic carcinoma (0/1).  H. LYMPH NODE, LEVEL 10, EXCISION:      One lymph node, negative for metastatic carcinoma (0/1).  I. LYMPH NODE, LEVEL 7 #2, EXCISION:      One lymph node, negative for metastatic carcinoma (0/1). J. LYMPH NODE, LEVEL 10 #2, EXCISION:      One lymph node, negative for metastatic carcinoma (0/1).  K. LYMPH NODE, LEVEL 10 #3, EXCISION:      One lymph node, negative for metastatic carcinoma (0/1).  L. LYMPH NODE, LEVEL 12, EXCISION:      One lymph node, negative for metastatic carcinoma (0/1).  M. VASCULAR MARGIN #1, EXCISION:      Vascular tissue, negative for malignancy.  N. VASCULAR MARGIN #2, EXCISION:      Vascular tissue, negative for malignancy.  O. VASCULAR MARGIN #3, EXCISION:      Vascular tissue, negative for malignancy.  P. VASCULAR MARGIN #4, EXCISION:      Vascular tissue, negative for malignancy.  Q. VASCULAR MARGIN #5, EXCISION:      Vascular tissue, negative for malignancy.  R. VASCULAR MARGIN #6, EXCISION:      Vascular tissue, negative for malignancy.  ONCOLOGY TABLE:  LUNG: Resection  Synchronous Tumors: Not applicable Total Number of Primary Tumors: One Procedure: Lobectomy Specimen Laterality: Right Tumor Focality: Unifocal Tumor Site: Right middle lobe Tumor Size: 3.2 cm      Total Tumor Size: 3.2 cm      Invasive Tumor Size (applies only to invasive nonmucinous adenocarcinoma with a lepidic           component): 3.2 cm Histologic Type: Adenocarcinoma Visceral Pleura Invasion: Not identified Direct Invasion of Adjacent Structures: No adjacent structures present Lymphovascular Invasion: Identified Margins: All margins negative for invasive carcinoma      Closest Margin(s) to Invasive Carcinoma: Cannot be determined due to separated margin      Margin(s) Involved by Invasive Carcinoma: None       Margin Status for Non-Invasive Tumor: None Treatment Effect: No known presurgical therapy      Percentage of Residual Viable Tumor: NA Regional Lymph Nodes:      Number of Lymph Nodes Involved: 0                           Nodal Sites with Tumor:  NA      Number of Lymph Nodes Examined: 10                      Nodal Sites Examined: Level 7, 8, 9, 10, 12 Distant Metastasis:      Distant Site(s) Involved: Not applicable Pathologic Stage Classification (pTNM, AJCC 8th Edition): pT2a, pN0 Ancillary Studies: Can be performed upon request Representative Tumor Block: D2 Comment(s): See comment ([id number])  COMMENT:  Immunohistochemical stains for CK AE1/AE3 and TTF-1 were performed on vascular margins (N, O, R and P). There are no definitive positive cells. The margin is negative for carcinoma. Selected slides were peer-reviewed by Dr. Kavin Parsley who agrees with the interpretation on part N.  Controls worked appropriately.  INTRAOPERATIVE DIAGNOSIS:  A1. LYMPH NODE, LEVEL 12 #2, FROZEN SECTION:         Negative for malignancy.        Rapid intraoperative consult diagnosis called to Dr. Luna Salinas by Dr. Talmadge Fail @ 1400 10/12/2023.  B1. LUNG, RIGHT UPPER LOBE NODULE, FROZEN SECTION:         Highly necrotic nodules with surrounding atypical pneumocyte proliferation.        Rapid intraoperative consult diagnosis called to Dr. Luna Salinas by Dr. Talmadge Fail @ 1428 10/12/2023.  B2. LUNG, RIGHT UPPER LOBE MARGIN, FROZEN SECTION:         Negative for malignancy.        Rapid intraoperative consult diagnosis called to Dr. Luna Salinas by Dr. Talmadge Fail @ 1428 10/12/2023.  C1. LYMPH NODE, LEVEL 12 #3, FROZEN SECTION:         Negative for malignancy.        Rapid intraoperative consult diagnosis called to Dr. Luna Salinas by Dr. Talmadge Fail @ 1432 10/12/2023.  D1. LUNG, RIGHT MIDDLE LOBE BRONCHIAL MARGIN, FROZEN SECTION:         Negative for carcinoma.        Rapid intraoperative consult diagnosis called to Dr.  Luna Salinas by Dr. Talmadge Fail @ 1531 10/12/2023.  D2. LUNG, RIGHT MIDDLE LOBE MASS, FROZEN SECTION:         Non-small cell carcinoma, favor adenocarcinoma.        Rapid intraoperative consult diagnosis called to Dr. Luna Salinas by Dr. Talmadge Fail @ 1531 10/12/2023.  D3. LUNG, RIGHT MIDDLE LOBE VASCULAR MARGINS, FROZEN SECTION:         Positive for carcinoma.        Rapid intraoperative consult diagnosis called to Dr. Luna Salinas by Dr. Talmadge Fail @ 1531 10/12/2023.  M1. VASCULAR MARGIN #1, FROZEN SECTION:       Negative for malignancy.      Rapid intraoperative consult diagnosis called to Dr. Luna Salinas by Dr. Talmadge Fail @ 4098 10/12/2023.  N1. VASCULAR MARGIN #2, FROZEN SECTION:       Negative for malignancy.      Rapid intraoperative consult diagnosis called to Dr. Luna Salinas by Dr. Talmadge Fail @ 1191 10/12/2023.  O1. VASCULAR MARGIN #3, FROZEN SECTION:       Atypical cells.      Rapid intraoperative consult diagnosis called to Dr. Luna Salinas by Dr. Talmadge Fail @ 4782 10/12/2023.  P1. VASCULAR MARGIN #4, FROZEN SECTION:       Negative for malignancy.      Rapid intraoperative consult diagnosis called to Dr. Luna Salinas by Dr. Talmadge Fail @ 9562 10/12/2023.  Q. VASCULAR MARGIN #5:       No tissue available.      Rapid intraoperative consult diagnosis called to Dr. Luna Salinas by Dr. Talmadge Fail @ [phone]  10/12/2023.  R1. VASCULAR MARGIN #6, FROZEN SECTION:       Negative for malignancy.      Rapid intraoperative consult diagnosis called to Dr. Luna Salinas by Dr. Talmadge Fail @ 1610 10/12/2023.   GROSS DESCRIPTION:  Specimen A: Received fresh for rapid intraoperative consult evaluation by frozen section and labeled "level 12 node #2" is a 0.5 x 0.4 x 0.3 cm anthracotic soft tissue, submitted in 1 block for frozen section.  Specimen B: Received fresh for rapid intraoperative consult evaluations of nodule and margin by frozen section is an 8 g, 5.7 x 3 x 2.6 cm wedge of lung tissue clinically identified as "right upper lobe wedge".  The pleura  is tan-pink to pink-purple with slight anthracosis, is smooth, and has 2 staple lines, 1 with a short suture identifying false margin and 1 with long suture identifying true margin.  On sectioning, there is a 0.8 cm dark red ill-defined nodule and adjacent 0.6 cm similar nodule. The nodules are 1.7 cm from the true margin. Block summary: Block 1 = section of nodules, submitted for frozen section Block 2 = parenchyma adjacent to true margin, submitted for frozen section Block 3 = remaining nodules, submitted for routine histology  Specimen C: Received fresh for rapid intraoperative consult evaluation by frozen section and labeled "level 12 node #3" is a 1.4 x 1 x 0.4 cm anthracotic soft tissue which is submitted in 1 block for frozen section.  Specimen D: Received fresh for rapid intraoperative consult evaluations of bronchial margin, mass and vascular margins by frozen section is a 58 g, 9.2 x 5 x 3.9 cm portion of lung clinically identified as "right middle lobe".  Pleura is tan-pink to dark red-purple with scattered adhesions.  Found directly beneath the hilum is a 3.2 x 3 x 2.5 cm tan-white firm ill-defined mass which comes within 0.5 cm of the bronchial margin and is grossly adjacent to the vascular margins. Uninvolved parenchyma is tan-pink to dark red, spongy.  No separately identified lymph nodes are found. Block summary: Block 1 = bronchial margin, submitted for frozen section Block 2 = section of mass, submitted for frozen section Block 3 = vascular margins, submitted for frozen section Blocks 4-6 = sections of mass submitted for routine histology  Specimen E: Received fresh labeled "level 9 node" is a 0.7 x 0.4 x 0.3 cm aggregate of yellow-pink to anthracotic soft tissue, submitted 1 block.  Specimen F: Received fresh labeled "level 8 node" is a 0.9 x 0.8 x 0.3 cm yellow to anthracotic soft tissue, submitted in 1 block.  Specimen G: Received fresh labeled "level 7  node" is a 0.8 x 0.6 x 0.3 cm aggregate of anthracotic soft tissue, submitted in 1 block.  Specimen H: Received fresh labeled "level 10 node" is a 0.5 x 0.4 x 0.2 cm yellow to anthracotic soft tissue, submitted in 1 block.  Specimen I: Received fresh labeled "level 7 node #2" is a 1.8 x 1.6 x 0.4 cm aggregate of yellow-red to anthracotic soft tissue, submitted 1 block.  Specimen J: Received fresh labeled "level 10 node #2" is a 0.7 x 0.5 x 0.3 cm anthracotic soft tissue, submitted 1 block.  Specimen K: Received fresh labeled "level 10 node #3" is a 0.7 cm soft anthracotic nodule, in toto, one block.  Specimen L: Received fresh labeled "level 12 node" is a 0.7 x 0.7 x 0.3 cm aggregate of yellow-red to anthracotic soft tissue, submitted in 1 block.  Specimen M: Received fresh  for rapid intraoperative consult evaluation by frozen section and labeled "vascular margin #1" is a minute gray-white soft tissue, submitted in 1 block for frozen section.  Specimen N: Received fresh for rapid intraoperative consult evaluation by frozen section and labeled "vascular margin #2" is a minute gray-white soft tissue, submitted in 1 block for frozen section.  Specimen O: Received fresh for rapid intraoperative consult evaluation by frozen section and labeled "vascular margin #3" is a 0.2 cm gray-white soft tissue, submitted in 1 block for frozen section.  Specimen P: Received fresh for rapid intraoperative consult evaluation by frozen section and labeled "vascular margin #4" is a 0.2 cm gray-white soft tissue, submitted in 1 block for frozen section.  Specimen Q: Received labeled "vascular margin #5" is a container of saline with no grossly identifiable tissue.  Specimen R: Received fresh for rapid intraoperative consult evaluation by frozen section and labeled "vascular margin #6" is a 0.8 x 0.7 x 0.25 cm gray-white soft tissue with several embedded staples and a suture on new margin.  A  section of tissue at the new margin is submitted in 1 block for frozen section.  SW 10/13/2023  Final Diagnosis performed by Dillard Frame, MD.   Electronically signed 10/20/2023 Technical component performed at [facility], [address] [place], [place] [address].  Professional component performed at [facility]. [address] [place], [place] [address]  Immunohistochemistry Technical component (if applicable) was performed at [facility] [employer]. [address], [address], [place], [place] [address].  IMMUNOHISTOCHEMISTRY DISCLAIMER (if applicable): Some of these immunohistochemical stains may have been developed and the performance characteristics determine by [facility]. Some may not have been cleared or approved by the U.S. Food and Drug Administration. The FDA has determined that such clearance or approval is not necessary. This test is used for clinical purposes. It should not be regarded as investigational or for research. This laboratory is certified under the Clinical Laboratory Improvement Amendments of 1988 (CLIA-88) as qualified to perform high complexity clinical laboratory testing.  The controls stained appropriately.  Resulting Agency [facility]        Specimen Collected: 10/12/23 13:00 Last Resulted: 10/20/23 14:52   MOLECULAR PATHOLOGY RESULT 10/12/2023:    RADIOGRAPHIC STUDIES: I have personally reviewed the radiological images as listed and agreed with the findings in the report. No results found.   ASSESSMENT & PLAN:   78 y.o. 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   Consistent with Light chain MGUS  Myeloma FISH panel with no notable genetic mutation.  2. Diagnosis of Adenocarcinoma, stage T2aN0: Discussed surgical pathology result from 10/12/2023 in detail with the patient. Showed right lung: Adenocarcinoma, acinar (90%), papillary (5%) and micropapillary  (5%) patterns.       Tumor size: 3.2 cm.       Lymphovascular invasion identified.       Vascular margin is positive for carcinoma (see final margins).       Bronchial margin is negative for carcinoma.  -Discussed Molecular pathology results from 10/12/2023 in detail with the patient. Positive for EGFR mutation. Negative for HRD signature.  -Educated the patient about Adenocarcinoma.   3.  Hypercalcemia this has improved on repeat labs and is down to 10.5 with normal ionized calcium  of 5.5. This could have been from dehydration but patient is also being worked up for plasma cell dyscrasia and also has an FDG avid right middle lobe lung lesion which  is squamous cell carcinoma could also be a factor  4.  FDG avid right middle lobe lung lesion concerning for primary bronchogenic carcinoma.  No overt lymphadenopathy noted.  PLAN:  -Discussed lab results on 01/12/2024 in detail with patient. CBC stable, showed WBC of 4.3K, hemoglobin of 11.2, and platelets of 153K. -there is minimal anemia fairly similar to her baseline -CMP normal - no sign of dehydration or low electrolytes from diarrhea -kidney and liver function normal -Cortisol levels noted to be slightly low at 2.7 - need to se eif that needs replacement -discussed that patient is not behaving like sometime who has low adrenal hormones  -will connect with Dr. Mark Sil to see if there is any need for further evaluation of low cortisol levels -discussed the need to ensure that her Tagrisso  medication is manageable especially given that she will likely be on it for 2-3 years -Patient has grade 1-2 diarrhea symptoms  -her diarrhea is not getting progressively worse while being on Tagrisso   -patient's Tagrisso  80 MG tablet was recently refilled -take 80 MG Tagrisso  tablet every other day. Then will refill Tagrisso  at 40 MG to take once daily -discussed that Tagrisso  is part of Adjuvant therapy to treat microscopic disease  -advised patient to let  us  know if her diarrhea is bothersome or if she has any other issues prior to her next visit -discussed that mild inflammation causing her cough should settle down eventually -recommend breathing exercises -will continue to monitor labs including light chains -will repeat chest scan in 3 months -patient shall return to clinic with labs in 3 months -answered all of patient's questions in detail  FOLLOW-UP: Ct chest w/o Contrast in 10 weeks RTC with Dr Salomon Cree in 12 weeks 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  All of the patient's questions were answered with apparent satisfaction. The patient knows to call the clinic with any problems, questions or concerns.   Jacquelyn Matt MD MS AAHIVMS [facility] Hematology/Oncology Physician [facility]  .*Total Encounter Time as defined by the Centers for Medicare and Medicaid Services includes, in addition to the face-to-face time of a patient visit (documented in the note above) non-face-to-face time: obtaining and reviewing outside history, ordering and reviewing medications, tests or procedures, care coordination (communications with other health care professionals or caregivers) and documentation in the medical record.    I,Mitra Faeizi,acting as a Neurosurgeon for Jacquelyn Matt, MD.,have documented all relevant documentation on the behalf of Jacquelyn Matt, MD,as directed by  Jacquelyn Matt, MD while in the presence of Jacquelyn Matt, MD.  ***

## 2024-01-12 ENCOUNTER — Other Ambulatory Visit (HOSPITAL_COMMUNITY): Payer: Self-pay

## 2024-01-12 ENCOUNTER — Other Ambulatory Visit: Payer: Self-pay

## 2024-01-12 ENCOUNTER — Inpatient Hospital Stay

## 2024-01-12 ENCOUNTER — Inpatient Hospital Stay: Attending: Hematology | Admitting: Hematology

## 2024-01-12 VITALS — BP 115/63 | HR 66 | Temp 97.9°F | Resp 13 | Wt 153.7 lb

## 2024-01-12 DIAGNOSIS — Z8049 Family history of malignant neoplasm of other genital organs: Secondary | ICD-10-CM | POA: Diagnosis not present

## 2024-01-12 DIAGNOSIS — C342 Malignant neoplasm of middle lobe, bronchus or lung: Secondary | ICD-10-CM | POA: Diagnosis not present

## 2024-01-12 DIAGNOSIS — M8929 Other disorders of bone development and growth, multiple sites: Secondary | ICD-10-CM | POA: Diagnosis not present

## 2024-01-12 DIAGNOSIS — E8809 Other disorders of plasma-protein metabolism, not elsewhere classified: Secondary | ICD-10-CM | POA: Diagnosis not present

## 2024-01-12 DIAGNOSIS — M549 Dorsalgia, unspecified: Secondary | ICD-10-CM | POA: Diagnosis not present

## 2024-01-12 DIAGNOSIS — Z79899 Other long term (current) drug therapy: Secondary | ICD-10-CM | POA: Insufficient documentation

## 2024-01-12 DIAGNOSIS — C3491 Malignant neoplasm of unspecified part of right bronchus or lung: Secondary | ICD-10-CM | POA: Insufficient documentation

## 2024-01-12 DIAGNOSIS — R197 Diarrhea, unspecified: Secondary | ICD-10-CM | POA: Insufficient documentation

## 2024-01-12 DIAGNOSIS — D472 Monoclonal gammopathy: Secondary | ICD-10-CM

## 2024-01-12 LAB — CBC WITH DIFFERENTIAL (CANCER CENTER ONLY)
Abs Immature Granulocytes: 0.01 10*3/uL (ref 0.00–0.07)
Basophils Absolute: 0 10*3/uL (ref 0.0–0.1)
Basophils Relative: 1 %
Eosinophils Absolute: 0.2 10*3/uL (ref 0.0–0.5)
Eosinophils Relative: 5 %
HCT: 34.1 % — ABNORMAL LOW (ref 36.0–46.0)
Hemoglobin: 11.2 g/dL — ABNORMAL LOW (ref 12.0–15.0)
Immature Granulocytes: 0 %
Lymphocytes Relative: 22 %
Lymphs Abs: 0.9 10*3/uL (ref 0.7–4.0)
MCH: 28.6 pg (ref 26.0–34.0)
MCHC: 32.8 g/dL (ref 30.0–36.0)
MCV: 87.2 fL (ref 80.0–100.0)
Monocytes Absolute: 0.5 10*3/uL (ref 0.1–1.0)
Monocytes Relative: 11 %
Neutro Abs: 2.6 10*3/uL (ref 1.7–7.7)
Neutrophils Relative %: 61 %
Platelet Count: 153 10*3/uL (ref 150–400)
RBC: 3.91 MIL/uL (ref 3.87–5.11)
RDW: 14 % (ref 11.5–15.5)
WBC Count: 4.3 10*3/uL (ref 4.0–10.5)
nRBC: 0 % (ref 0.0–0.2)

## 2024-01-12 LAB — IRON AND IRON BINDING CAPACITY (CC-WL,HP ONLY)
Iron: 57 ug/dL (ref 28–170)
Saturation Ratios: 14 % (ref 10.4–31.8)
TIBC: 403 ug/dL (ref 250–450)
UIBC: 346 ug/dL (ref 148–442)

## 2024-01-12 LAB — CMP (CANCER CENTER ONLY)
ALT: 10 U/L (ref 0–44)
AST: 16 U/L (ref 15–41)
Albumin: 3.8 g/dL (ref 3.5–5.0)
Alkaline Phosphatase: 73 U/L (ref 38–126)
Anion gap: 6 (ref 5–15)
BUN: 14 mg/dL (ref 8–23)
CO2: 26 mmol/L (ref 22–32)
Calcium: 10 mg/dL (ref 8.9–10.3)
Chloride: 108 mmol/L (ref 98–111)
Creatinine: 0.77 mg/dL (ref 0.44–1.00)
GFR, Estimated: >60 mL/min (ref >60)
Glucose, Bld: 91 mg/dL (ref 70–99)
Potassium: 4.1 mmol/L (ref 3.5–5.1)
Sodium: 140 mmol/L (ref 135–145)
Total Bilirubin: 0.4 mg/dL (ref 0.0–1.2)
Total Protein: 6.8 g/dL (ref 6.5–8.1)

## 2024-01-12 LAB — MAGNESIUM: Magnesium: 2 mg/dL (ref 1.7–2.4)

## 2024-01-12 LAB — FERRITIN: Ferritin: 27 ng/mL (ref 11–307)

## 2024-01-12 MED ORDER — OSIMERTINIB MESYLATE 40 MG PO TABS
40.0000 mg | ORAL_TABLET | Freq: Every day | ORAL | 5 refills | Status: DC
  Filled 2024-01-12 – 2024-01-18 (×3): qty 30, 30d supply, fill #0
  Filled 2024-02-21: qty 30, 30d supply, fill #1
  Filled 2024-03-26: qty 30, 30d supply, fill #2
  Filled 2024-04-16: qty 30, 30d supply, fill #3
  Filled 2024-05-17: qty 30, 30d supply, fill #4
  Filled 2024-06-21: qty 30, 30d supply, fill #5

## 2024-01-15 ENCOUNTER — Telehealth: Payer: Self-pay | Admitting: Hematology

## 2024-01-15 NOTE — Telephone Encounter (Signed)
 Let patient know about upcoming appointment

## 2024-01-17 ENCOUNTER — Other Ambulatory Visit: Payer: Self-pay

## 2024-01-18 ENCOUNTER — Other Ambulatory Visit: Payer: Self-pay

## 2024-01-18 ENCOUNTER — Other Ambulatory Visit: Payer: Self-pay | Admitting: Pharmacy Technician

## 2024-01-18 NOTE — Progress Notes (Signed)
 Specialty Pharmacy Refill Coordination Note  Audrey [name] is a 78 y.o. female contacted today regarding refills of specialty medication(s) Osimertinib  Mesylate (TAGRISSO )   Patient requested [name] at [facility] at [place] date: 01/30/24   Medication will be filled on 01/30/24.

## 2024-01-22 ENCOUNTER — Other Ambulatory Visit: Payer: Medicare PPO

## 2024-01-23 ENCOUNTER — Other Ambulatory Visit (HOSPITAL_COMMUNITY): Payer: Self-pay

## 2024-01-30 ENCOUNTER — Other Ambulatory Visit (HOSPITAL_COMMUNITY): Payer: Self-pay

## 2024-02-05 ENCOUNTER — Telehealth: Payer: Medicare PPO | Admitting: Hematology

## 2024-02-06 DIAGNOSIS — L304 Erythema intertrigo: Secondary | ICD-10-CM | POA: Diagnosis not present

## 2024-02-06 DIAGNOSIS — L03317 Cellulitis of buttock: Secondary | ICD-10-CM | POA: Diagnosis not present

## 2024-02-06 DIAGNOSIS — L989 Disorder of the skin and subcutaneous tissue, unspecified: Secondary | ICD-10-CM | POA: Diagnosis not present

## 2024-02-19 ENCOUNTER — Other Ambulatory Visit: Payer: Self-pay | Admitting: Thoracic Surgery (Cardiothoracic Vascular Surgery)

## 2024-02-19 DIAGNOSIS — R911 Solitary pulmonary nodule: Secondary | ICD-10-CM

## 2024-02-20 ENCOUNTER — Ambulatory Visit
Admission: RE | Admit: 2024-02-20 | Discharge: 2024-02-20 | Disposition: A | Discharge status: Home / Self Care | Source: Ambulatory Visit | Attending: Cardiovascular Disease | Admitting: Cardiovascular Disease

## 2024-02-20 ENCOUNTER — Ambulatory Visit: Admitting: Thoracic Surgery (Cardiothoracic Vascular Surgery)

## 2024-02-20 ENCOUNTER — Encounter: Payer: Self-pay | Admitting: Thoracic Surgery (Cardiothoracic Vascular Surgery)

## 2024-02-20 VITALS — BP 106/61 | HR 74 | Resp 20 | Ht 62.0 in | Wt 149.2 lb

## 2024-02-20 DIAGNOSIS — K449 Diaphragmatic hernia without obstruction or gangrene: Secondary | ICD-10-CM | POA: Insufficient documentation

## 2024-02-20 DIAGNOSIS — C342 Malignant neoplasm of middle lobe, bronchus or lung: Secondary | ICD-10-CM

## 2024-02-20 DIAGNOSIS — J90 Pleural effusion, not elsewhere classified: Secondary | ICD-10-CM | POA: Insufficient documentation

## 2024-02-20 DIAGNOSIS — Z902 Acquired absence of lung [part of]: Secondary | ICD-10-CM | POA: Diagnosis not present

## 2024-02-20 DIAGNOSIS — J929 Pleural plaque without asbestos: Secondary | ICD-10-CM | POA: Diagnosis not present

## 2024-02-20 DIAGNOSIS — R911 Solitary pulmonary nodule: Secondary | ICD-10-CM | POA: Insufficient documentation

## 2024-02-20 NOTE — Progress Notes (Signed)
 [address]       Audrey [name] [address]             [phone]      HPI: Audrey [name] returns for a scheduled follow-up visit  Audrey [name] is a 78 year old woman with a history of hypercalcemia due to a plasma cell dyscrasia.  She had a PET/CT for evaluation which revealed a hypermetabolic right middle lobe mass.  She underwent a right middle lobectomy on 10/12/2023.  The mass turned out to be a T2a, N0, stage Ib adenocarcinoma.  She was EGFR positive and is currently on Tagrisso .  Initially she had a lot of issues with cutaneous hypersensitivity.  She is on gabapentin  for that, 300 mg morning and afternoon and 600 mg at night.  Also had frequent cough.  She feels well.  Still has a little bit of skin hypersensitivity but is much better.  She is not having any respiratory issues.  She is tolerating Tagrisso .  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     atorvastatin  (LIPITOR) 40 MG tablet Take 40 mg by mouth daily.     Cholecalciferol (VITAMIN D ) 50 MCG (2000 UT) tablet Take 2,000 Units by mouth daily.     FLUoxetine  (PROZAC ) 20 MG capsule Take 20 mg by mouth daily.     gabapentin  (NEURONTIN ) 300 MG capsule Take 1 tablet (300 mg) in the morning and afternoon.  Take 2 tablets (600 mg) at bedtime 120 capsule 3   guaiFENesin  (MUCINEX ) 600 MG 12 hr tablet Take 1 tablet (600 mg total) by mouth 2 (two) times daily as needed for to  loosen phlegm or cough.     levothyroxine  (SYNTHROID , LEVOTHROID) 25 MCG tablet TAKE 1 TABLET BY MOUTH BEFORE BREAKFAST (Patient taking differently: Take 25 mcg by mouth daily before breakfast.) 90 tablet 3   Multiple Vitamins-Minerals (PRESERVISION AREDS) CAPS Take 1 capsule by mouth in the morning and at bedtime.     naproxen sodium (ALEVE) 220 MG tablet Take 220 mg by mouth daily as needed (pain).     nystatin-triamcinolone (MYCOLOG II) cream Apply 1 application topically daily as needed (Vaginal itcing).      osimertinib  mesylate (TAGRISSO ) 40 MG tablet Take 1 tablet (40 mg total) by mouth daily. 30 tablet 5   pantoprazole  (PROTONIX ) 40 MG tablet Take 40 mg by mouth daily.     SYSTANE ULTRA 0.4-0.3 % SOLN Place 1 drop into both eyes in the morning and at bedtime.     No current facility-administered medications for this visit.    Physical Exam BP 106/61 (BP Location: Right Arm, Patient Position: Sitting, Cuff Size: Normal)   Pulse 74   Resp 20   Ht 5' 2 (1.575 m)   Wt 149 lb 3.2 oz (67.7  kg)   SpO2 97% Comment: RA  BMI 27.23 kg/m  78 year old woman in no acute distress Alert and oriented x 3 with no focal deficits Lungs clear bilaterally Incisions well-healed Cardiac regular rate and rhythm  Diagnostic Tests: I personally reviewed her chest x-ray images.  No worrisome findings.  Impression: Audrey [name] is a 78 year old non-smoker who was incidentally found to have a right middle lobe mass on workup for a plasma cell dyscrasia.  She underwent a right middle lobectomy.  The mass turned out to be a stage Ib adenocarcinoma.  It was EGFR positive.  She is currently on Tagrisso .  She is now about 4 months out from surgery.  She feels well.  No respiratory issues.  Her cutaneous hypersensitivity is remarkably improved.    She asked about gabapentin .  I told her we can try weaning off that at any time but she should not stop the medication suddenly.  I would recommend continuing  the morning and afternoon dosing and then dropping down to 300 mg at night.  She should wait a couple of weeks and see if there is any change.  If no worsening she could then drop the afternoon dose.  Wait a couple more weeks possibly drop the morning dose and then a couple more weeks before stopping altogether.  Plan: Continue to follow-up with Dr. Salomon Cree. Return in 9 months for 1 year follow-up.  Audrey Higashi, MD [facility] [phone]

## 2024-02-21 ENCOUNTER — Other Ambulatory Visit: Payer: Self-pay

## 2024-02-21 ENCOUNTER — Encounter (INDEPENDENT_AMBULATORY_CARE_PROVIDER_SITE_OTHER): Payer: Self-pay

## 2024-02-21 ENCOUNTER — Other Ambulatory Visit (HOSPITAL_COMMUNITY): Payer: Self-pay

## 2024-02-21 NOTE — Progress Notes (Signed)
 Specialty Pharmacy Refill Coordination Note  MyChart Questionnaire Submission  Audrey [name] is a 78 y.o. female contacted today regarding refills of specialty medication(s) Osimertinib  Mesylate (TAGRISSO )  Patient requested: [name] at [facility] at [place] date: 02/26/24  Medication will be filled on 02/23/24.

## 2024-02-23 ENCOUNTER — Other Ambulatory Visit: Payer: Self-pay

## 2024-02-29 ENCOUNTER — Other Ambulatory Visit: Payer: Self-pay

## 2024-02-29 MED ORDER — GABAPENTIN 300 MG PO CAPS: ORAL_CAPSULE | ORAL | 3 refills | Status: AC

## 2024-02-29 NOTE — Progress Notes (Signed)
 Patient seen in the office for follow up. She was advised by Dr. Kerrin to continue taking Gabapentin  but she could wean off slowly. Advised to take 1 tablet in the morning and afternoon and 1 tablet in the evening. New prescription send into patient's preferred pharmacy, [facility] [address] in [place]. Dr. Kerrin aware.

## 2024-03-03 ENCOUNTER — Encounter: Payer: Self-pay | Admitting: Hematology

## 2024-03-04 ENCOUNTER — Other Ambulatory Visit: Payer: Self-pay

## 2024-03-04 DIAGNOSIS — C342 Malignant neoplasm of middle lobe, bronchus or lung: Secondary | ICD-10-CM

## 2024-03-04 MED ORDER — ONDANSETRON HCL 4 MG PO TABS: 4.0000 mg | ORAL_TABLET | Freq: Three times a day (TID) | ORAL | 0 refills | Status: AC | PRN

## 2024-03-05 ENCOUNTER — Other Ambulatory Visit (HOSPITAL_COMMUNITY): Payer: Self-pay

## 2024-03-05 ENCOUNTER — Other Ambulatory Visit: Payer: Self-pay

## 2024-03-05 DIAGNOSIS — B029 Zoster without complications: Secondary | ICD-10-CM | POA: Diagnosis not present

## 2024-03-05 NOTE — Progress Notes (Signed)
 Specialty Pharmacy Ongoing Clinical Assessment Note  Audrey [name] is a 78 y.o. female who is being followed by the specialty pharmacy service for RxSp Oncology   Patient's specialty medication(s) reviewed today: Osimertinib  Mesylate (TAGRISSO )   Missed doses in the last 4 weeks: 0   Patient/Caregiver did not have any additional questions or concerns.   Therapeutic benefit summary: Patient is achieving benefit   Adverse events/side effects summary: Experienced adverse events/side effects (occasional diarrhea, uses Imodium PRN and rare nausea)   Patient's therapy is appropriate to: Continue    Goals Addressed             This Visit's Progress    Maintain optimal adherence to therapy   On track    Patient is initiating therapy. Patient will maintain adherence         Follow up: 3 months  Silvano LOISE [name] Specialty Pharmacist    Clinical Intervention Note  Clinical Intervention Notes: Zofran  and Tagrisso  can both increase the QT interval, patient reports that she only uses the prn Zofran  prn very rarely. She was counseled on symptoms of QT interval prolongation to report to her provider if they occur. She was understanding.   Clinical Intervention Outcomes: Prevention of an adverse drug event   Silvano LOISE [name] Karel Santa

## 2024-03-06 ENCOUNTER — Other Ambulatory Visit: Payer: Self-pay

## 2024-03-15 ENCOUNTER — Encounter (HOSPITAL_COMMUNITY): Payer: Self-pay | Admitting: Radiology

## 2024-03-15 ENCOUNTER — Ambulatory Visit (HOSPITAL_COMMUNITY)
Admission: RE | Admit: 2024-03-15 | Discharge: 2024-03-15 | Disposition: A | Discharge status: Home / Self Care | Source: Ambulatory Visit | Attending: Internal Medicine | Admitting: Internal Medicine

## 2024-03-15 DIAGNOSIS — E236 Other disorders of pituitary gland: Secondary | ICD-10-CM | POA: Insufficient documentation

## 2024-03-15 DIAGNOSIS — R9089 Other abnormal findings on diagnostic imaging of central nervous system: Secondary | ICD-10-CM | POA: Diagnosis not present

## 2024-03-15 DIAGNOSIS — D352 Benign neoplasm of pituitary gland: Secondary | ICD-10-CM | POA: Diagnosis not present

## 2024-03-15 MED ORDER — GADOBUTROL 1 MMOL/ML IV SOLN
7.0000 mL | Freq: Once | INTRAVENOUS | Status: AC | PRN
  Administered 2024-03-15: 7 mL via INTRAVENOUS

## 2024-03-18 ENCOUNTER — Inpatient Hospital Stay: Attending: Hematology | Admitting: Internal Medicine

## 2024-03-18 DIAGNOSIS — E236 Other disorders of pituitary gland: Secondary | ICD-10-CM

## 2024-03-18 NOTE — Progress Notes (Signed)
 I connected with Audrey [name] on 03/18/24 at  3:00 PM EDT by telephone visit and verified that I am speaking with the correct person using two identifiers.  I discussed the limitations, risks, security and privacy concerns of performing an evaluation and management service by telemedicine and the availability of in-person appointments. I also discussed with the patient that there may be a patient responsible charge related to this service. The patient expressed understanding and agreed to proceed.   Other persons participating in the visit and their role in the encounter:  spouse  Patient's location:  Home Provider's location:  Office Chief Complaint:  Pituitary mass [facility])  History of Present Ilness: Audrey [name] reports no clinical changes today.  No changes with vision, no headaches or seizures.   Observations: Language and cognition at baseline  Imaging:  [facility] Clinician Interpretation: I have personally reviewed the CNS images as listed.  My interpretation, in the context of the patient's clinical presentation, is stable disease  MR BRAIN W WO CONTRAST Result Date: 03/15/2024 CLINICAL DATA:  Brain/CNS neoplasm, assess treatment response. Pituitary micro adenoma. History of lung nodule. EXAM: MRI HEAD WITHOUT AND WITH CONTRAST TECHNIQUE: Multiplanar, multiecho pulse sequences of the brain and surrounding structures were obtained without and with intravenous contrast. CONTRAST:  7mL GADAVIST  GADOBUTROL  1 MMOL/ML IV SOLN COMPARISON:  MRI head 10/30/2023. FINDINGS: Brain: No acute infarct. No evidence of intracranial hemorrhage. Minimal white matter signal abnormality. Mild age related parenchymal volume loss. 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 No edema, mass effect, or midline shift. The basilar cisterns are patent. No extra-axial fluid collections. No enhancing lesions. Pituitary: Similar appearance of hypoenhancing versus nonenhancing lesion within the pituitary within  the posterior aspect of the pituitary slightly left of midline measuring up to 3 mm best appreciated on series 26, image 64. There is no involvement of the suprasellar cistern. The pituitary infundibulum is midline. Normal appearance of the optic chiasm and hypothalamus. Ventricles: Normal size and configuration of the ventricles. Vascular: Skull base flow voids are visualized. Skull and upper cervical spine: No focal abnormality. Sinuses/Orbits: Bilateral lens replacement. Mild mucosal thickening in the ethmoid sinuses. Other: Mastoid air cells are clear. IMPRESSION: Similar appearance of 3 mm hypoenhancing versus nonenhancing focus within the pituitary suggestive of pituitary micro adenoma. No enhancing lesions in the brain parenchyma. Mild age related parenchymal volume loss. Electronically Signed   By: Donnice Mania M.D.   On: 03/15/2024 12:02   DG Chest 2 View Result Date: 02/20/2024 CLINICAL DATA:  RATS , lung nodule EXAM: CHEST - 2 VIEW COMPARISON:  November 21, 2023, August 25, 2023 FINDINGS: Biapical pleural thickening. Linear scarring in the right mid lung. Trace right pleural effusion. No pneumothorax or airspace consolidation. Hiatal hernia. No cardiomegaly. No acute fracture or destructive lesion. IMPRESSION: Trace right pleural effusion.  No pneumothorax. Electronically Signed   By: Rogelia Myers M.D.   On: 02/20/2024 14:29    Assessment and Plan: Pituitary mass (HCC)  Clinically stable, MRI demonstrates no changes.  Hormone levels were WNL.  Follow Up Instructions: We ask that Audrey [name] return to clinic in 12 months following next brain MRI, or sooner as needed.  I discussed the assessment and treatment plan with the patient.  The patient was provided an opportunity to ask questions and all were answered.  The patient agreed with the plan and demonstrated understanding of the instructions.    The patient was advised to call back or seek an  in-person evaluation if the symptoms  worsen or if the condition fails to improve as anticipated.    Prabhleen Montemayor K Jolana Runkles, MD   I provided 20 minutes of non face-to-face telephone visit time during this encounter, and > 50% was spent counseling as documented under my assessment & plan.

## 2024-03-19 DIAGNOSIS — L989 Disorder of the skin and subcutaneous tissue, unspecified: Secondary | ICD-10-CM | POA: Diagnosis not present

## 2024-03-19 DIAGNOSIS — M545 Low back pain, unspecified: Secondary | ICD-10-CM | POA: Diagnosis not present

## 2024-03-19 DIAGNOSIS — R203 Hyperesthesia: Secondary | ICD-10-CM | POA: Diagnosis not present

## 2024-03-19 DIAGNOSIS — G8929 Other chronic pain: Secondary | ICD-10-CM | POA: Diagnosis not present

## 2024-03-19 DIAGNOSIS — M7062 Trochanteric bursitis, left hip: Secondary | ICD-10-CM | POA: Diagnosis not present

## 2024-03-21 ENCOUNTER — Ambulatory Visit (HOSPITAL_COMMUNITY)
Admission: RE | Admit: 2024-03-21 | Discharge: 2024-03-21 | Disposition: A | Discharge status: Home / Self Care | Source: Ambulatory Visit | Attending: Hematology | Admitting: Hematology

## 2024-03-21 DIAGNOSIS — J984 Other disorders of lung: Secondary | ICD-10-CM | POA: Diagnosis not present

## 2024-03-21 DIAGNOSIS — C342 Malignant neoplasm of middle lobe, bronchus or lung: Secondary | ICD-10-CM | POA: Insufficient documentation

## 2024-03-21 DIAGNOSIS — I700 Atherosclerosis of aorta: Secondary | ICD-10-CM | POA: Diagnosis not present

## 2024-03-21 DIAGNOSIS — K449 Diaphragmatic hernia without obstruction or gangrene: Secondary | ICD-10-CM | POA: Diagnosis not present

## 2024-03-26 ENCOUNTER — Other Ambulatory Visit: Payer: Self-pay

## 2024-03-26 ENCOUNTER — Encounter (INDEPENDENT_AMBULATORY_CARE_PROVIDER_SITE_OTHER): Payer: Self-pay

## 2024-03-26 NOTE — Progress Notes (Signed)
 Specialty Pharmacy Refill Coordination Note  Audrey [name] is a 78 y.o. female contacted today regarding refills of specialty medication(s) Osimertinib  Mesylate (TAGRISSO )   Patient requested (Patient-Rptd) Pickup at [facility] date: (Patient-Rptd) 03/29/24   Medication will be filled on 07.24.25.

## 2024-03-27 ENCOUNTER — Other Ambulatory Visit: Payer: Self-pay

## 2024-04-04 DIAGNOSIS — M545 Low back pain, unspecified: Secondary | ICD-10-CM | POA: Diagnosis not present

## 2024-04-07 NOTE — Progress Notes (Signed)
 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   HISTORY OF PRESENTING ILLNESS:   Audrey [name] is a wonderful 78 y.o. female who has been referred to us  by Ransom Other, MD for evaluation and management of M spike on UPEP and hypercalcemia.  Today she is accompanied by her husband and daughter-in-law, who help provide history. Patient reports that her PCP is Dr. Claudene. Patient reports that her calcium  has been elevated over the last couple of months.   She denies any lumps/bumps, fever, chills, night sweats, unexplained weight loss, or night sweats. Patient reports normal eating habits. Patient was noted to be hard of hearing.   She complains of generalized aching after activity present over the last couple of years. If she rests, her symptoms resolve. She denies any  localized discomfort to a particular area. She denies any spine pain with palpation.  Patient has had falls in the past and has had compression fractures in the spine with pain in those areas. No lytics lesions were found on imaging. Patient reports that she last received imaging of the back 6-12 months ago.   She reports that she cannot lay on her right side as pressure to her hip causes pain. This symptom has been present  for a while and is stable.   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 Patient denies any major inctions such as COVID-19 or flu.  Patient has no known kidney disease or autoimmune conditions such as RA or lupus. Her daughter-in-law r(Dr Alfred Eckley) reports that patient's son has Hoshimoto's thyroiditis and it is possible that patient may also have it. Patient does take thyroid   medication.   Patient reports that she was recently diagnosed with Osteoporosis and has not yet started treatment for it. Her daughter-in-law reports that patient is currently in the prior authorization stage for Prolia. Patient did use Fosamax a while ago.   She complains of brain fog, balance issues, and mild dizziness. Patient has had balance issues for several years. She has tried  balance physical therapy in the past.   Patient reports that she is constantly cold.   She reports that she is currently in the beginning stages of macular degeneration.   She generally does not drink any water and does drink caffeine regularly.   INTERVAL HISTORY:  Audrey [name] is a wonderful 78 y.o. 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   Patient was last seen by me on 01/12/2024 and reported diarrhea, low energy, chronic back pain, coughing triggered by deep breathing, and improved mouth sores. She also noted having sensitive skin around the area of her surgery in the right abdomen.  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 Per, Dr. Eward note, pt was recommended to return to clinic with him in 1 year following her next brain MRI, or sooner if needed.   Patient notes she has been doing well overall.  Has lost about 10 to 15 pounds but this has significantly been from dietary changes and cutting out sugars.  She notes some mild fatigue.  Grade 1 diarrhea with 1-3 softer bowel movements a day.  Has not been bothersome enough for her to use Imodium and she finds it tolerable. She has been family eating a lot of fresh fruits and vegetables.  No skin rashes no new shortness of breath no chest pain. No significant infection issues. Had a question about getting an RSV vaccination which we recommended she get.   MEDICAL HISTORY:  Past Medical History:  Diagnosis Date    Arthritis    Cancer (HCC)    basal cell cancer on face removed; Dr Joshua   Fracture    right 4th and 5th metacarpal   GERD (gastroesophageal reflux disease)    Hearing aid worn    B/L   History of hiatal hernia    HOH (hard of hearing)    Hyperlipidemia    Hypothyroidism    Osteopenia after menopause    Pneumonia    Uterine prolapse    Wears glasses     SURGICAL HISTORY: Past Surgical History:  Procedure Laterality Date   CATARACT EXTRACTION Bilateral    COLONOSCOPY  09/05/2010   neg; [facility]   HEMORRHOID SURGERY     INTERCOSTAL NERVE BLOCK Right 10/12/2023   Procedure: INTERCOSTAL NERVE BLOCK;  Surgeon: Kerrin Elspeth BROCKS, MD;  Location: [facility] OR;  Service: Thoracic;  Laterality: Right;   NODE DISSECTION Right 10/12/2023   Procedure: NODE DISSECTION;  Surgeon: Kerrin Elspeth BROCKS, MD;  Location: [facility] OR;  Service: Thoracic;  Laterality: Right;   OPEN REDUCTION INTERNAL FIXATION (ORIF) METACARPAL Right 04/10/2020   Procedure: OPEN REDUCTION INTERNAL FIXATION (ORIF)  AS NECESSARY RIGHT FOURTH AND FIFTH METACARPAL FRACTURES;  Surgeon: Camella Fallow, MD;  Location: [facility] OR;  Service: Orthopedics;  Laterality: Right;  OPEN REDUCTION INTERNAL FIXATION (ORIF)  AS NECESSARY RIGHT FOURTH AND FIFTH METACARPAL FRACTURES    SOCIAL HISTORY: Social History   Socioeconomic History   Marital status: Married    Spouse name: Not on file   Number of children: 2   Years of education: Not on file   Highest education level: Not on file  Occupational History   Not on file  Tobacco Use   Smoking status: Never   Smokeless tobacco: Never  Vaping Use   Vaping status: Never Used  Substance and Sexual Activity   Alcohol  use: No    Alcohol /week: 0.0 standard drinks of alcohol    Drug use: No   Sexual activity: Not on file  Other Topics Concern   Not on file  Social History Narrative   Lives with spouse;   One son in [place]   Dtr in [place]   2 grand children   2 twin girl; in  [place] in July (11)   Social Drivers of Health   Financial Resource Strain: Low Risk  (07/12/2018)   Overall Financial Resource Strain (CARDIA)    Difficulty of Paying Living Expenses: Not hard at all  Food Insecurity: No Food Insecurity (10/14/2023)   Hunger Vital Sign    Worried About Running Out of Food in the Last Year: Never true    Ran Out of Food in the Last Year: Never true  Transportation Needs: No Transportation Needs (10/14/2023)   PRAPARE - Administrator, Civil Service (Medical): No    Lack of Transportation (Non-Medical): No  Physical Activity: Inactive (07/12/2018)   Exercise Vital Sign    Days of Exercise per Week: 0 days    Minutes of Exercise per Session: 0 min  Stress: No Stress Concern Present (07/12/2018)   [employer] of Occupational  Health - Occupational Stress Questionnaire    Feeling of Stress : Not at all  Social Connections: Unknown (10/15/2023)   Social Connection and Isolation Panel    Frequency of Communication with Friends and Family: More than three times a week    Frequency of Social Gatherings with Friends and Family: More than three times a week    Attends Religious Services: Patient declined    Database administrator or Organizations: Patient declined    Attends Banker Meetings: Patient declined    Marital Status: Married  Catering manager Violence: Not At Risk (10/14/2023)   Humiliation, Afraid, Rape, and Kick questionnaire    Fear of Current or Ex-Partner: No    Emotionally Abused: No    Physically Abused: No    Sexually Abused: No    FAMILY HISTORY: Family History  Problem Relation Age of Onset   Heart attack Mother        in 91s   Uterine cancer Mother    Heart attack Father        in 61s   Coronary artery disease Brother        stent in 46s   Other Brother        ? leukemia   Stroke Maternal Grandmother    Heart attack Maternal Grandfather        in 20s   Diabetes Neg Hx    Colon cancer Neg Hx     Esophageal cancer Neg Hx    Rectal cancer Neg Hx    Stomach cancer Neg Hx     ALLERGIES:  is allergic to celecoxib and sulfonamide derivatives.  MEDICATIONS:  Current Outpatient Medications  Medication Sig Dispense Refill   acyclovir  (ZOVIRAX ) 400 MG tablet Take 1 tablet (400 mg total) by mouth 2 (two) times daily. 60 tablet 5   acetaminophen  (TYLENOL ) 500 MG tablet Take 1-2 tablets (500-1,000 mg total) by mouth every 6 (six) hours as needed.     atorvastatin  (LIPITOR) 40 MG tablet Take 40 mg by mouth daily.     Cholecalciferol (VITAMIN D ) 50 MCG (2000 UT) tablet Take 2,000 Units by mouth daily.     FLUoxetine  (PROZAC ) 20 MG capsule Take 20 mg by mouth daily.     gabapentin  (NEURONTIN ) 300 MG capsule Take 1 tablet (300 mg) in the morning and afternoon.  Take 1 tablets (300 mg) at bedtime 120 capsule 3   guaiFENesin  (MUCINEX ) 600 MG 12 hr tablet Take 1 tablet (600 mg total) by mouth 2 (two) times daily as needed for to loosen phlegm or cough.     levothyroxine  (SYNTHROID , LEVOTHROID) 25 MCG tablet TAKE 1 TABLET BY MOUTH BEFORE BREAKFAST (Patient taking differently: Take 25 mcg by mouth daily before breakfast.) 90 tablet 3   Multiple Vitamins-Minerals (PRESERVISION AREDS) CAPS Take 1 capsule by mouth in the morning and at bedtime.     naproxen sodium (ALEVE) 220 MG tablet Take 220 mg by mouth daily as needed (pain).     nystatin-triamcinolone (MYCOLOG II) cream Apply 1 application topically daily as needed (Vaginal itcing).      ondansetron  (ZOFRAN ) 4 MG tablet Take 1 tablet (4 mg total) by mouth every 8 (eight) hours as needed for nausea or vomiting. 20 tablet 0   osimertinib  mesylate (TAGRISSO ) 40 MG tablet Take 1 tablet (40 mg total) by mouth daily. 30 tablet 5   pantoprazole  (PROTONIX ) 40 MG tablet Take 40 mg by mouth daily.     SYSTANE ULTRA 0.4-0.3 %  SOLN Place 1 drop into both eyes in the morning and at bedtime.     No current facility-administered medications for this visit.     REVIEW OF SYSTEMS:    10 Point review of Systems was done is negative except as noted above.   PHYSICAL EXAMINATION:  .BP 119/62   Pulse 63   Temp (!) 97.5 F (36.4 C)   Resp 18   Wt 144 lb 14.4 oz (65.7 kg)   SpO2 94%   BMI 26.50 kg/m   GENERAL:alert, in no acute distress and comfortable SKIN: no acute rashes, no significant lesions EYES: conjunctiva are pink and non-injected, sclera anicteric OROPHARYNX: MMM, no exudates, no oropharyngeal erythema or ulceration NECK: supple, no JVD LYMPH:  no palpable lymphadenopathy in the cervical, axillary or inguinal regions LUNGS: clear to auscultation b/l with normal respiratory effort HEART: regular rate & rhythm ABDOMEN:  normoactive bowel sounds , non tender, not distended. Extremity: no pedal edema PSYCH: alert & oriented x 3 with fluent speech NEURO: no focal motor/sensory deficits   LABORATORY DATA:  I have reviewed the data as listed      Latest Ref Rng & Units 04/08/2024   11:26 AM 01/12/2024    8:58 AM 11/27/2023   11:15 AM  CBC  WBC 4.0 - 10.5 K/uL 4.6  4.3  6.3   Hemoglobin 12.0 - 15.0 g/dL 87.6  88.7  88.0   Hematocrit 36.0 - 46.0 % 37.9  34.1  37.0   Platelets 150 - 400 K/uL 153  153  274     .    Latest Ref Rng & Units 04/08/2024   11:26 AM 01/12/2024    8:58 AM 11/27/2023   11:15 AM  CMP  Glucose 70 - 99 mg/dL 98  91  96   BUN 8 - 23 mg/dL 9  14  15    Creatinine 0.44 - 1.00 mg/dL 9.19  9.22  9.36   Sodium 135 - 145 mmol/L 140  140  141   Potassium 3.5 - 5.1 mmol/L 4.9  4.1  4.1   Chloride 98 - 111 mmol/L 107  108  108   CO2 22 - 32 mmol/L 27  26  26    Calcium  8.9 - 10.3 mg/dL 89.2  89.9  89.3   Total Protein 6.5 - 8.1 g/dL 7.2  6.8  7.0   Total Bilirubin 0.0 - 1.2 mg/dL 0.5  0.4  0.3   Alkaline Phos 38 - 126 U/L 82  73  98   AST 15 - 41 U/L 21  16  16    ALT 0 - 44 U/L 18  10  14      SURGICAL PATHOLOGY RESULT from 10/12/2023:    Results Surgical pathology (Order [id number]) Surgical  pathology Order: [id number]  Status: Edited Result - FINAL     Next appt: 01/22/2024 at 10:30 AM in Oncology ([facility])   Test Result Released: Yes (seen)   0 Result Notes    Component Ref Range & Units (hover) 1 mo ago  SURGICAL PATHOLOGY SURGICAL PATHOLOGY CASE: [id number] PATIENT: Audrey [name] Surgical Pathology Report     Clinical History: right middle lobe lung mass (cf)     FINAL MICROSCOPIC DIAGNOSIS:  A. LYMPH NODE, LEVEL 12 #2, EXCISION:      One lymph node, negative for metastatic carcinoma (0/1).  B. LUNG, RIGHT UPPER LOBE WEDGE RESECTION:      Benign pulmonary parenchyma with hemorrhage and necrosis.      Negative  for malignancy.  C. LYMPH NODE, LEVEL 12 #3, EXCISION:      One lymph node, negative for metastatic carcinoma (0/1).  D. LUNG, RIGHT MIDDLE LOBECTOMY:      Adenocarcinoma, acinar (90%), papillary (5%) and micropapillary (5%) patterns.      Tumor size: 3.2 cm.      Lymphovascular invasion identified.      Vascular margin is positive for carcinoma (see final margins).      Bronchial margin is negative for carcinoma.      See oncology table.  E. LYMPH NODE, LEVEL 9, EXCISION:      One lymph node, negative for metastatic carcinoma (0/1).  F. LYMPH NODE, LEVEL 8, EXCISION:      One lymph node, negative for metastatic carcinoma (0/1).  G. LYMPH NODE, LEVEL 7, EXCISION:      One lymph node, negative for metastatic carcinoma (0/1).  H. LYMPH NODE, LEVEL 10, EXCISION:      One lymph node, negative for metastatic carcinoma (0/1).  I. LYMPH NODE, LEVEL 7 #2, EXCISION:      One lymph node, negative for metastatic carcinoma (0/1). J. LYMPH NODE, LEVEL 10 #2, EXCISION:      One lymph node, negative for metastatic carcinoma (0/1).  K. LYMPH NODE, LEVEL 10 #3, EXCISION:      One lymph node, negative for metastatic carcinoma (0/1).  L. LYMPH NODE, LEVEL 12, EXCISION:      One lymph node, negative for metastatic carcinoma (0/1).  M.  VASCULAR MARGIN #1, EXCISION:      Vascular tissue, negative for malignancy.  N. VASCULAR MARGIN #2, EXCISION:      Vascular tissue, negative for malignancy.  O. VASCULAR MARGIN #3, EXCISION:      Vascular tissue, negative for malignancy.  P. VASCULAR MARGIN #4, EXCISION:      Vascular tissue, negative for malignancy.  Q. VASCULAR MARGIN #5, EXCISION:      Vascular tissue, negative for malignancy.  R. VASCULAR MARGIN #6, EXCISION:      Vascular tissue, negative for malignancy.  ONCOLOGY TABLE:  LUNG: Resection  Synchronous Tumors: Not applicable Total Number of Primary Tumors: One Procedure: Lobectomy Specimen Laterality: Right Tumor Focality: Unifocal Tumor Site: Right middle lobe Tumor Size: 3.2 cm      Total Tumor Size: 3.2 cm      Invasive Tumor Size (applies only to invasive nonmucinous adenocarcinoma with a lepidic           component): 3.2 cm Histologic Type: Adenocarcinoma Visceral Pleura Invasion: Not identified Direct Invasion of Adjacent Structures: No adjacent structures present Lymphovascular Invasion: Identified Margins: All margins negative for invasive carcinoma      Closest Margin(s) to Invasive Carcinoma: Cannot be determined due to separated margin      Margin(s) Involved by Invasive Carcinoma: None      Margin Status for Non-Invasive Tumor: None Treatment Effect: No known presurgical therapy      Percentage of Residual Viable Tumor: NA Regional Lymph Nodes:      Number of Lymph Nodes Involved: 0                           Nodal Sites with Tumor: NA      Number of Lymph Nodes Examined: 10                      Nodal Sites Examined: Level 7, 8, 9, 10, 12 Distant Metastasis:  Distant Site(s) Involved: Not applicable Pathologic Stage Classification (pTNM, AJCC 8th Edition): pT2a, pN0 Ancillary Studies: Can be performed upon request Representative Tumor Block: D2 Comment(s): See comment ([id number])  COMMENT:  Immunohistochemical stains for CK  AE1/AE3 and TTF-1 were performed on vascular margins (N, O, R and P). There are no definitive positive cells. The margin is negative for carcinoma. Selected slides were peer-reviewed by Dr. Reed who agrees with the interpretation on part N.  Controls worked appropriately.  INTRAOPERATIVE DIAGNOSIS:  A1. LYMPH NODE, LEVEL 12 #2, FROZEN SECTION:         Negative for malignancy.        Rapid intraoperative consult diagnosis called to Dr. Kerrin by Dr. Macie @ 1400 10/12/2023.  B1. LUNG, RIGHT UPPER LOBE NODULE, FROZEN SECTION:         Highly necrotic nodules with surrounding atypical pneumocyte proliferation.        Rapid intraoperative consult diagnosis called to Dr. Kerrin by Dr. Macie @ 1428 10/12/2023.  B2. LUNG, RIGHT UPPER LOBE MARGIN, FROZEN SECTION:         Negative for malignancy.        Rapid intraoperative consult diagnosis called to Dr. Kerrin by Dr. Macie @ 1428 10/12/2023.  C1. LYMPH NODE, LEVEL 12 #3, FROZEN SECTION:         Negative for malignancy.        Rapid intraoperative consult diagnosis called to Dr. Kerrin by Dr. Macie @ 1432 10/12/2023.  D1. LUNG, RIGHT MIDDLE LOBE BRONCHIAL MARGIN, FROZEN SECTION:         Negative for carcinoma.        Rapid intraoperative consult diagnosis called to Dr. Kerrin by Dr. Macie @ 1531 10/12/2023.  D2. LUNG, RIGHT MIDDLE LOBE MASS, FROZEN SECTION:         Non-small cell carcinoma, favor adenocarcinoma.        Rapid intraoperative consult diagnosis called to Dr. Kerrin by Dr. Macie @ 1531 10/12/2023.  D3. LUNG, RIGHT MIDDLE LOBE VASCULAR MARGINS, FROZEN SECTION:         Positive for carcinoma.        Rapid intraoperative consult diagnosis called to Dr. Kerrin by Dr. Macie @ 1531 10/12/2023.  M1. VASCULAR MARGIN #1, FROZEN SECTION:       Negative for malignancy.      Rapid intraoperative consult diagnosis called to Dr. Kerrin by Dr. Macie @ 8367 10/12/2023.  N1. VASCULAR MARGIN #2, FROZEN  SECTION:       Negative for malignancy.      Rapid intraoperative consult diagnosis called to Dr. Kerrin by Dr. Macie @ 8367 10/12/2023.  O1. VASCULAR MARGIN #3, FROZEN SECTION:       Atypical cells.      Rapid intraoperative consult diagnosis called to Dr. Kerrin by Dr. Macie @ 8367 10/12/2023.  P1. VASCULAR MARGIN #4, FROZEN SECTION:       Negative for malignancy.      Rapid intraoperative consult diagnosis called to Dr. Kerrin by Dr. Macie @ 8367 10/12/2023.  Q. VASCULAR MARGIN #5:       No tissue available.      Rapid intraoperative consult diagnosis called to Dr. Kerrin by Dr. Macie @ 8367 10/12/2023.  R1. VASCULAR MARGIN #6, FROZEN SECTION:       Negative for malignancy.      Rapid intraoperative consult diagnosis called to Dr. Kerrin by Dr. Macie @ 8367 10/12/2023.   GROSS DESCRIPTION:  Specimen A: Received fresh for rapid intraoperative  consult evaluation by frozen section and labeled level 12 node #2 is a 0.5 x 0.4 x 0.3 cm anthracotic soft tissue, submitted in 1 block for frozen section.  Specimen B: Received fresh for rapid intraoperative consult evaluations of nodule and margin by frozen section is an 8 g, 5.7 x 3 x 2.6 cm wedge of lung tissue clinically identified as right upper lobe wedge.  The pleura is tan-pink to pink-purple with slight anthracosis, is smooth, and has 2 staple lines, 1 with a short suture identifying false margin and 1 with long suture identifying true margin.  On sectioning, there is a 0.8 cm dark red ill-defined nodule and adjacent 0.6 cm similar nodule. The nodules are 1.7 cm from the true margin. Block summary: Block 1 = section of nodules, submitted for frozen section Block 2 = parenchyma adjacent to true margin, submitted for frozen section Block 3 = remaining nodules, submitted for routine histology  Specimen C: Received fresh for rapid intraoperative consult evaluation by frozen section and labeled level 12 node  #3 is a 1.4 x 1 x 0.4 cm anthracotic soft tissue which is submitted in 1 block for frozen section.  Specimen D: Received fresh for rapid intraoperative consult evaluations of bronchial margin, mass and vascular margins by frozen section is a 58 g, 9.2 x 5 x 3.9 cm portion of lung clinically identified as right middle lobe.  Pleura is tan-pink to dark red-purple with scattered adhesions.  Found directly beneath the hilum is a 3.2 x 3 x 2.5 cm tan-white firm ill-defined mass which comes within 0.5 cm of the bronchial margin and is grossly adjacent to the vascular margins. Uninvolved parenchyma is tan-pink to dark red, spongy.  No separately identified lymph nodes are found. Block summary: Block 1 = bronchial margin, submitted for frozen section Block 2 = section of mass, submitted for frozen section Block 3 = vascular margins, submitted for frozen section Blocks 4-6 = sections of mass submitted for routine histology  Specimen E: Received fresh labeled level 9 node is a 0.7 x 0.4 x 0.3 cm aggregate of yellow-pink to anthracotic soft tissue, submitted 1 block.  Specimen F: Received fresh labeled level 8 node is a 0.9 x 0.8 x 0.3 cm yellow to anthracotic soft tissue, submitted in 1 block.  Specimen G: Received fresh labeled level 7 node is a 0.8 x 0.6 x 0.3 cm aggregate of anthracotic soft tissue, submitted in 1 block.  Specimen H: Received fresh labeled level 10 node is a 0.5 x 0.4 x 0.2 cm yellow to anthracotic soft tissue, submitted in 1 block.  Specimen I: Received fresh labeled level 7 node #2 is a 1.8 x 1.6 x 0.4 cm aggregate of yellow-red to anthracotic soft tissue, submitted 1 block.  Specimen J: Received fresh labeled level 10 node #2 is a 0.7 x 0.5 x 0.3 cm anthracotic soft tissue, submitted 1 block.  Specimen K: Received fresh labeled level 10 node #3 is a 0.7 cm soft anthracotic nodule, in toto, one block.  Specimen L: Received fresh labeled level 12  node is a 0.7 x 0.7 x 0.3 cm aggregate of yellow-red to anthracotic soft tissue, submitted in 1 block.  Specimen M: Received fresh for rapid intraoperative consult evaluation by frozen section and labeled vascular margin #1 is a minute gray-white soft tissue, submitted in 1 block for frozen section.  Specimen N: Received fresh for rapid intraoperative consult evaluation by frozen section and labeled vascular margin #2 is a minute gray-white  soft tissue, submitted in 1 block for frozen section.  Specimen O: Received fresh for rapid intraoperative consult evaluation by frozen section and labeled vascular margin #3 is a 0.2 cm gray-white soft tissue, submitted in 1 block for frozen section.  Specimen P: Received fresh for rapid intraoperative consult evaluation by frozen section and labeled vascular margin #4 is a 0.2 cm gray-white soft tissue, submitted in 1 block for frozen section.  Specimen Q: Received labeled vascular margin #5 is a container of saline with no grossly identifiable tissue.  Specimen R: Received fresh for rapid intraoperative consult evaluation by frozen section and labeled vascular margin #6 is a 0.8 x 0.7 x 0.25 cm gray-white soft tissue with several embedded staples and a suture on new margin.  A section of tissue at the new margin is submitted in 1 block for frozen section.  SW 10/13/2023  Final Diagnosis performed by Pepper Dutton, MD.   Electronically signed 10/20/2023 Technical component performed at [facility]. [facility], [address], [place], [place] [address].  Professional component performed at [facility]. [address] [place], [place] [address]  Immunohistochemistry Technical component (if applicable) was performed at [facility]. [address], [place], [place] [address].  IMMUNOHISTOCHEMISTRY DISCLAIMER (if applicable): Some of these immunohistochemical stains may  have been developed and the performance characteristics determine by [facility]. Some may not have been cleared or approved by the U.S. Food and Drug Administration. The FDA has determined that such clearance or approval is not necessary. This test is used for clinical purposes. It should not be regarded as investigational or for research. This laboratory is certified under the Clinical Laboratory Improvement Amendments of 1988 (CLIA-88) as qualified to perform high complexity clinical laboratory testing.  The controls stained appropriately.  Resulting Agency [facility]        Specimen Collected: 10/12/23 13:00 Last Resulted: 10/20/23 14:52   MOLECULAR PATHOLOGY RESULT 10/12/2023:    RADIOGRAPHIC STUDIES: I have personally reviewed the radiological images as listed and agreed with the findings in the report. CT Chest Wo Contrast Result Date: 03/28/2024 CLINICAL DATA:  Right middle lobe non-small cell lung cancer. Restaging. EXAM: CT CHEST WITHOUT CONTRAST TECHNIQUE: Multidetector CT imaging of the chest was performed following the standard protocol without IV contrast. RADIATION DOSE REDUCTION: This exam was performed according to the departmental dose-optimization program which includes automated exposure control, adjustment of the mA and/or kV according to patient size and/or use of iterative reconstruction technique. COMPARISON:  Chest PA Lat 02/20/2024, 11/21/2023, PET-CT 08/25/2023, and chest CT without contrast 04/07/2020. FINDINGS: Cardiovascular: The pulmonary arteries and veins are normal in caliber. The heart is not enlarged. There is atherosclerosis in the aorta and great vessels. No aortic aneurysm is seen. No pericardial effusion. Mediastinum/Nodes: No adenopathy is seen without contrast. Moderate to large hiatal hernia is again shown. Negative thyroid  gland, axillary spaces, thoracic trachea, both main bronchi. Lungs/Pleura: There postsurgical changes of surgical  resection of the right middle lobe neoplasm noted on PET-CT with right middle lobectomy, and associated scarring changes in the right lower lung field. Asymmetric pleural-parenchymal scarring in the right upper lobe is again noted seen previously. There is mild subpleural reticulation in the lateral right lower lobe base and scattered linear scar-like opacities in the left lower lobe with small posterior left diaphragmatic fat herniation. There is a calcified granuloma in the lingular base, another in the posterior left lower lobe base. There is a 3  mm opacity in the right lower lobe on 5:106 consistent with a subsegmental bronchial impaction. There is no consolidation, effusion or pneumothorax. Rest of the lungs are clear. Upper Abdomen: There are small stones layering in the posterior gallbladder without wall thickening or bile duct dilatation. No acute upper abdominal findings are seen. The abdominal aorta demonstrates scattered calcification. There is a 1.8 cm Bosniak 1 cyst in the posterior lower right kidney, Hounsfield density is 9. No follow-up imaging recommended. Musculoskeletal: There is osteopenia and degenerative change of the spine with mild kypho dextroscoliosis. No regional bone metastasis is suspected. The ribcage is intact. No chest wall mass is seen. Portions of the outer breasts could not be included in the field. IMPRESSION: 1. Postsurgical changes of right middle lobectomy with associated scarring changes in the right lower lung field. 2. No evidence of local recurrence or metastatic disease. 3. 3 mm subsegmental bronchial impaction in the right lower lobe. 4. Aortic atherosclerosis. 5. Moderate to large hiatal hernia. 6. Cholelithiasis. 7. Osteopenia and degenerative change. Aortic Atherosclerosis (ICD10-I70.0). Electronically Signed   By: Francis Quam M.D.   On: 03/28/2024 04:21   MR BRAIN W WO CONTRAST Result Date: 03/15/2024 CLINICAL DATA:  Brain/CNS neoplasm, assess treatment  response. Pituitary micro adenoma. History of lung nodule. EXAM: MRI HEAD WITHOUT AND WITH CONTRAST TECHNIQUE: Multiplanar, multiecho pulse sequences of the brain and surrounding structures were obtained without and with intravenous contrast. CONTRAST:  7mL GADAVIST  GADOBUTROL  1 MMOL/ML IV SOLN COMPARISON:  MRI head 10/30/2023. FINDINGS: Brain: No acute infarct. No evidence of intracranial hemorrhage. Minimal white matter signal abnormality. Mild age related parenchymal volume loss. 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 No edema, mass effect, or midline shift. The basilar cisterns are patent. No extra-axial fluid collections. No enhancing lesions. Pituitary: Similar appearance of hypoenhancing versus nonenhancing lesion within the pituitary within the posterior aspect of the pituitary slightly left of midline measuring up to 3 mm best appreciated on series 26, image 64. There is no involvement of the suprasellar cistern. The pituitary infundibulum is midline. Normal appearance of the optic chiasm and hypothalamus. Ventricles: Normal size and configuration of the ventricles. Vascular: Skull base flow voids are visualized. Skull and upper cervical spine: No focal abnormality. Sinuses/Orbits: Bilateral lens replacement. Mild mucosal thickening in the ethmoid sinuses. Other: Mastoid air cells are clear. IMPRESSION: Similar appearance of 3 mm hypoenhancing versus nonenhancing focus within the pituitary suggestive of pituitary micro adenoma. No enhancing lesions in the brain parenchyma. Mild age related parenchymal volume loss. Electronically Signed   By: Donnice Mania M.D.   On: 03/15/2024 12:02     ASSESSMENT & PLAN:   78 y.o. 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   Consistent with Light chain MGUS  Myeloma FISH panel with no notable genetic mutation. Plan - Patient has no clinical symptoms suggestive of progression for plasma cell dyscrasia. - Labs done today are  currently pending   2.  Lung adenocarcinoma stage T2aN0: Discussed surgical pathology result from 10/12/2023 in detail with the patient. Showed right lung: Adenocarcinoma, acinar (90%), papillary (5%) and micropapillary (5%) patterns.       Tumor size: 3.2 cm.       Lymphovascular invasion identified.       Vascular margin is positive for carcinoma (see final margins).       Bronchial margin is negative for carcinoma.  -Discussed Molecular pathology results from 10/12/2023 in detail with the patient.  Positive for EGFR  mutation. Negative for HRD signature.  -Educated the patient about Adenocarcinoma. PLAN - Patient has no clinical concerns for new pulmonary symptoms. - CBC and CMP are stable. -EKG shows no QT prolongation. - CT chest without contrast done recently showed no evidence of new lung nodules or signs of lung cancer progression. - Patient is tolerating the Tagrisso  at 40 mg p.o. daily better than she was at 80 mg.  Her GI symptoms have been much more manageable with only grade 1 diarrhea. - She has not needed Imodium but was counseled that she could take this if needed. - She has had some weight loss but that appears to be voluntarily related to changes in her diet. - Her thyroid  functions were checked today and are within normal limits.  She will continue on her current dose of Synthroid  with her PCP. - We will plan to repeat CT chest without contrast in 6 months.  3.  Hypercalcemia calcium  10.7 but corrected calcium  within normal limits.  4.  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  Hiatal hernia with GERD -Follow-up with PCP  FOLLOW-UP:  EKG today Labs today RTC with Dr Onesimo with labs in 3 months   The total time spent in the appointment was 20 minutes* .  All of the patient's questions were answered with apparent satisfaction. The patient knows to call the clinic with any problems, questions or concerns.   Emaline Onesimo MD MS AAHIVMS [facility]  [facility] Hematology/Oncology Physician [facility]  .*Total Encounter Time as defined by the Centers for Medicare and Medicaid Services includes, in addition to the face-to-face time of a patient visit (documented in the note above) non-face-to-face time: obtaining and reviewing outside history, ordering and reviewing medications, tests or procedures, care coordination (communications with other health care professionals or caregivers) and documentation in the medical record.   I,Mitra Faeizi,acting as a Neurosurgeon for Emaline Onesimo, MD.,have documented all relevant documentation on the behalf of Emaline Onesimo, MD,as directed by  Emaline Onesimo, MD while in the presence of Emaline Onesimo, MD.  .I have reviewed the above documentation for accuracy and completeness, and I agree with the above. .Mallery Harshman Kishore Rayn Enderson MD

## 2024-04-08 ENCOUNTER — Other Ambulatory Visit: Payer: Self-pay

## 2024-04-08 ENCOUNTER — Inpatient Hospital Stay

## 2024-04-08 ENCOUNTER — Inpatient Hospital Stay: Attending: Hematology | Admitting: Hematology

## 2024-04-08 VITALS — BP 119/62 | HR 63 | Temp 97.5°F | Resp 18 | Wt 144.9 lb

## 2024-04-08 DIAGNOSIS — C342 Malignant neoplasm of middle lobe, bronchus or lung: Secondary | ICD-10-CM | POA: Diagnosis not present

## 2024-04-08 DIAGNOSIS — D352 Benign neoplasm of pituitary gland: Secondary | ICD-10-CM | POA: Insufficient documentation

## 2024-04-08 DIAGNOSIS — E236 Other disorders of pituitary gland: Secondary | ICD-10-CM

## 2024-04-08 DIAGNOSIS — E8809 Other disorders of plasma-protein metabolism, not elsewhere classified: Secondary | ICD-10-CM | POA: Diagnosis not present

## 2024-04-08 DIAGNOSIS — K219 Gastro-esophageal reflux disease without esophagitis: Secondary | ICD-10-CM | POA: Diagnosis not present

## 2024-04-08 DIAGNOSIS — K449 Diaphragmatic hernia without obstruction or gangrene: Secondary | ICD-10-CM | POA: Insufficient documentation

## 2024-04-08 DIAGNOSIS — D759 Disease of blood and blood-forming organs, unspecified: Secondary | ICD-10-CM | POA: Diagnosis not present

## 2024-04-08 DIAGNOSIS — D472 Monoclonal gammopathy: Secondary | ICD-10-CM

## 2024-04-08 DIAGNOSIS — Z8049 Family history of malignant neoplasm of other genital organs: Secondary | ICD-10-CM | POA: Insufficient documentation

## 2024-04-08 DIAGNOSIS — Z85828 Personal history of other malignant neoplasm of skin: Secondary | ICD-10-CM | POA: Diagnosis not present

## 2024-04-08 LAB — CMP (CANCER CENTER ONLY)
ALT: 18 U/L (ref 0–44)
AST: 21 U/L (ref 15–41)
Albumin: 4.4 g/dL (ref 3.5–5.0)
Alkaline Phosphatase: 82 U/L (ref 38–126)
Anion gap: 6 (ref 5–15)
BUN: 9 mg/dL (ref 8–23)
CO2: 27 mmol/L (ref 22–32)
Calcium: 10.7 mg/dL — ABNORMAL HIGH (ref 8.9–10.3)
Chloride: 107 mmol/L (ref 98–111)
Creatinine: 0.8 mg/dL (ref 0.44–1.00)
GFR, Estimated: >60 mL/min (ref >60)
Glucose, Bld: 98 mg/dL (ref 70–99)
Potassium: 4.9 mmol/L (ref 3.5–5.1)
Sodium: 140 mmol/L (ref 135–145)
Total Bilirubin: 0.5 mg/dL (ref 0.0–1.2)
Total Protein: 7.2 g/dL (ref 6.5–8.1)

## 2024-04-08 LAB — CBC WITH DIFFERENTIAL (CANCER CENTER ONLY)
Abs Immature Granulocytes: 0 K/uL (ref 0.00–0.07)
Basophils Absolute: 0 K/uL (ref 0.0–0.1)
Basophils Relative: 1 %
Eosinophils Absolute: 0.2 K/uL (ref 0.0–0.5)
Eosinophils Relative: 4 %
HCT: 37.9 % (ref 36.0–46.0)
Hemoglobin: 12.3 g/dL (ref 12.0–15.0)
Immature Granulocytes: 0 %
Lymphocytes Relative: 29 %
Lymphs Abs: 1.3 K/uL (ref 0.7–4.0)
MCH: 29.3 pg (ref 26.0–34.0)
MCHC: 32.5 g/dL (ref 30.0–36.0)
MCV: 90.2 fL (ref 80.0–100.0)
Monocytes Absolute: 0.4 K/uL (ref 0.1–1.0)
Monocytes Relative: 9 %
Neutro Abs: 2.6 K/uL (ref 1.7–7.7)
Neutrophils Relative %: 57 %
Platelet Count: 153 K/uL (ref 150–400)
RBC: 4.2 MIL/uL (ref 3.87–5.11)
RDW: 17.8 % — ABNORMAL HIGH (ref 11.5–15.5)
WBC Count: 4.6 K/uL (ref 4.0–10.5)
nRBC: 0 % (ref 0.0–0.2)

## 2024-04-08 LAB — MAGNESIUM: Magnesium: 2.3 mg/dL (ref 1.7–2.4)

## 2024-04-08 LAB — T4, FREE: Free T4: 0.9 ng/dL (ref 0.61–1.12)

## 2024-04-08 LAB — TSH: TSH: 3.45 u[IU]/mL (ref 0.350–4.500)

## 2024-04-08 MED ORDER — ACYCLOVIR 400 MG PO TABS: 400.0000 mg | ORAL_TABLET | Freq: Two times a day (BID) | ORAL | 5 refills | Status: DC

## 2024-04-09 LAB — KAPPA/LAMBDA LIGHT CHAINS
Kappa free light chain: 144.3 mg/L — ABNORMAL HIGH (ref 3.3–19.4)
Kappa, lambda light chain ratio: 8.64 — ABNORMAL HIGH (ref 0.26–1.65)
Lambda free light chains: 16.7 mg/L (ref 5.7–26.3)

## 2024-04-11 LAB — MULTIPLE MYELOMA PANEL, SERUM
Albumin SerPl Elph-Mcnc: 4 g/dL (ref 2.9–4.4)
Albumin/Glob SerPl: 1.7 (ref 0.7–1.7)
Alpha 1: 0.2 g/dL (ref 0.0–0.4)
Alpha2 Glob SerPl Elph-Mcnc: 0.7 g/dL (ref 0.4–1.0)
B-Globulin SerPl Elph-Mcnc: 0.9 g/dL (ref 0.7–1.3)
Gamma Glob SerPl Elph-Mcnc: 0.8 g/dL (ref 0.4–1.8)
Globulin, Total: 2.5 g/dL (ref 2.2–3.9)
IgA: 93 mg/dL (ref 64–422)
IgG (Immunoglobin G), Serum: 1031 mg/dL (ref 586–1602)
IgM (Immunoglobulin M), Srm: 64 mg/dL (ref 26–217)
Total Protein ELP: 6.5 g/dL (ref 6.0–8.5)

## 2024-04-16 ENCOUNTER — Encounter (INDEPENDENT_AMBULATORY_CARE_PROVIDER_SITE_OTHER): Payer: Self-pay

## 2024-04-16 ENCOUNTER — Other Ambulatory Visit (HOSPITAL_COMMUNITY): Payer: Self-pay

## 2024-04-16 ENCOUNTER — Other Ambulatory Visit: Payer: Self-pay

## 2024-04-16 NOTE — Progress Notes (Signed)
 Specialty Pharmacy Refill Coordination Note  MyChart Questionnaire Submission  Audrey [name] is a 78 y.o. female contacted today regarding refills of specialty medication(s) Tagrisso .  Doses on hand: (Patient-Rptd) 18   Patient requested: (Patient-Rptd) Pickup at [facility] at [facility] date: 04/22/24  Medication will be filled on 04/19/24.

## 2024-04-16 NOTE — Progress Notes (Signed)
 Clinical Intervention Note  Clinical Intervention Notes: Patient reported starting acyclovir . No DDIs identified with Tagrisso    Clinical Intervention Outcomes: Prevention of an adverse drug event   Advertising account planner

## 2024-04-19 ENCOUNTER — Other Ambulatory Visit: Payer: Self-pay

## 2024-05-09 DIAGNOSIS — T148XXA Other injury of unspecified body region, initial encounter: Secondary | ICD-10-CM | POA: Diagnosis not present

## 2024-05-09 DIAGNOSIS — Z23 Encounter for immunization: Secondary | ICD-10-CM | POA: Diagnosis not present

## 2024-05-17 ENCOUNTER — Other Ambulatory Visit: Payer: Self-pay

## 2024-05-17 NOTE — Progress Notes (Signed)
 Specialty Pharmacy Refill Coordination Note  Audrey [name] is a 78 y.o. female contacted today regarding refills of specialty medication(s) Osimertinib  Mesylate (TAGRISSO )   Patient requested Delivery   Pickup date: 05/28/24   Medication will be filled on 09.22.25.

## 2024-05-21 ENCOUNTER — Encounter: Payer: Self-pay | Admitting: Hematology

## 2024-05-21 ENCOUNTER — Other Ambulatory Visit: Payer: Self-pay

## 2024-05-21 NOTE — Progress Notes (Signed)
 Specialty Pharmacy Ongoing Clinical Assessment Note  Audrey [name] is a 78 y.o. female who is being followed by the specialty pharmacy service for RxSp Oncology   Patient's specialty medication(s) reviewed today: Osimertinib  Mesylate (TAGRISSO )   Missed doses in the last 4 weeks: No data recorded  Patient/Caregiver did not have any additional questions or concerns.   Therapeutic benefit summary: Patient is achieving benefit   Adverse events/side effects summary: Experienced adverse events/side effects (Patient's diarrhea has improved since lowering the dose, but she sitll has some diarrhea episodes (about 3 times per month- tolerable with Imodium, tastes disturbances, and fatigue; all are tolerable at this time)   Patient's therapy is appropriate to: Continue    Goals Addressed             This Visit's Progress    Maintain optimal adherence to therapy   On track    Patient is initiating therapy. Patient will maintain adherence         Follow up: 3 months  Silvano LOISE [name] Specialty Pharmacist

## 2024-05-21 NOTE — Progress Notes (Signed)
 Contacted pt regarding MyChart message. Pt can refill her lost/misplaced acyclovir  and pay for it out of pocket. Cost with a discount card will be ariound 20 dollars according to Pt's Pharmacy. ([facility] on [address]) Pt acknowledged information and verbalized understanding.

## 2024-05-24 DIAGNOSIS — H524 Presbyopia: Secondary | ICD-10-CM | POA: Diagnosis not present

## 2024-05-27 ENCOUNTER — Other Ambulatory Visit: Payer: Self-pay

## 2024-06-06 DIAGNOSIS — D2239 Melanocytic nevi of other parts of face: Secondary | ICD-10-CM | POA: Diagnosis not present

## 2024-06-06 DIAGNOSIS — D225 Melanocytic nevi of trunk: Secondary | ICD-10-CM | POA: Diagnosis not present

## 2024-06-06 DIAGNOSIS — I788 Other diseases of capillaries: Secondary | ICD-10-CM | POA: Diagnosis not present

## 2024-06-06 DIAGNOSIS — Z85828 Personal history of other malignant neoplasm of skin: Secondary | ICD-10-CM | POA: Diagnosis not present

## 2024-06-06 DIAGNOSIS — D3617 Benign neoplasm of peripheral nerves and autonomic nervous system of trunk, unspecified: Secondary | ICD-10-CM | POA: Diagnosis not present

## 2024-06-06 DIAGNOSIS — L821 Other seborrheic keratosis: Secondary | ICD-10-CM | POA: Diagnosis not present

## 2024-06-06 DIAGNOSIS — L720 Epidermal cyst: Secondary | ICD-10-CM | POA: Diagnosis not present

## 2024-06-21 ENCOUNTER — Other Ambulatory Visit: Payer: Self-pay

## 2024-06-21 ENCOUNTER — Encounter (INDEPENDENT_AMBULATORY_CARE_PROVIDER_SITE_OTHER): Payer: Self-pay

## 2024-06-21 NOTE — Progress Notes (Signed)
 Specialty Pharmacy Refill Coordination Note  Audrey [name] is a 78 y.o. female contacted today regarding refills of specialty medication(s) Osimertinib  Mesylate (TAGRISSO )   Patient requested (Patient-Rptd) Pickup at [facility] at [facility] date: (Patient-Rptd) 06/27/24   Medication will be filled on 06/26/24.

## 2024-06-26 ENCOUNTER — Other Ambulatory Visit: Payer: Self-pay

## 2024-07-02 DIAGNOSIS — E7800 Pure hypercholesterolemia, unspecified: Secondary | ICD-10-CM | POA: Diagnosis not present

## 2024-07-02 DIAGNOSIS — F419 Anxiety disorder, unspecified: Secondary | ICD-10-CM | POA: Diagnosis not present

## 2024-07-02 DIAGNOSIS — C3491 Malignant neoplasm of unspecified part of right bronchus or lung: Secondary | ICD-10-CM | POA: Diagnosis not present

## 2024-07-02 DIAGNOSIS — Z1331 Encounter for screening for depression: Secondary | ICD-10-CM | POA: Diagnosis not present

## 2024-07-02 DIAGNOSIS — D472 Monoclonal gammopathy: Secondary | ICD-10-CM | POA: Diagnosis not present

## 2024-07-02 DIAGNOSIS — Z0000 Encounter for general adult medical examination without abnormal findings: Secondary | ICD-10-CM | POA: Diagnosis not present

## 2024-07-02 DIAGNOSIS — E039 Hypothyroidism, unspecified: Secondary | ICD-10-CM | POA: Diagnosis not present

## 2024-07-02 DIAGNOSIS — N814 Uterovaginal prolapse, unspecified: Secondary | ICD-10-CM | POA: Diagnosis not present

## 2024-07-02 DIAGNOSIS — K219 Gastro-esophageal reflux disease without esophagitis: Secondary | ICD-10-CM | POA: Diagnosis not present

## 2024-07-05 DIAGNOSIS — I10 Essential (primary) hypertension: Secondary | ICD-10-CM | POA: Diagnosis not present

## 2024-07-05 DIAGNOSIS — D352 Benign neoplasm of pituitary gland: Secondary | ICD-10-CM | POA: Diagnosis not present

## 2024-07-05 DIAGNOSIS — E785 Hyperlipidemia, unspecified: Secondary | ICD-10-CM | POA: Diagnosis not present

## 2024-07-05 DIAGNOSIS — K219 Gastro-esophageal reflux disease without esophagitis: Secondary | ICD-10-CM | POA: Diagnosis not present

## 2024-07-05 DIAGNOSIS — E039 Hypothyroidism, unspecified: Secondary | ICD-10-CM | POA: Diagnosis not present

## 2024-07-05 DIAGNOSIS — F329 Major depressive disorder, single episode, unspecified: Secondary | ICD-10-CM | POA: Diagnosis not present

## 2024-07-05 DIAGNOSIS — F419 Anxiety disorder, unspecified: Secondary | ICD-10-CM | POA: Diagnosis not present

## 2024-07-05 DIAGNOSIS — M810 Age-related osteoporosis without current pathological fracture: Secondary | ICD-10-CM | POA: Diagnosis not present

## 2024-07-05 DIAGNOSIS — G319 Degenerative disease of nervous system, unspecified: Secondary | ICD-10-CM | POA: Diagnosis not present

## 2024-07-11 ENCOUNTER — Other Ambulatory Visit: Payer: Self-pay

## 2024-07-11 DIAGNOSIS — C342 Malignant neoplasm of middle lobe, bronchus or lung: Secondary | ICD-10-CM

## 2024-07-15 ENCOUNTER — Other Ambulatory Visit (HOSPITAL_COMMUNITY): Payer: Self-pay

## 2024-07-15 ENCOUNTER — Inpatient Hospital Stay: Admitting: Hematology

## 2024-07-15 ENCOUNTER — Other Ambulatory Visit: Payer: Self-pay

## 2024-07-15 ENCOUNTER — Inpatient Hospital Stay: Attending: Hematology

## 2024-07-15 VITALS — BP 121/63 | HR 63 | Temp 97.9°F | Resp 20 | Wt 137.6 lb

## 2024-07-15 DIAGNOSIS — K219 Gastro-esophageal reflux disease without esophagitis: Secondary | ICD-10-CM | POA: Diagnosis not present

## 2024-07-15 DIAGNOSIS — D7589 Other specified diseases of blood and blood-forming organs: Secondary | ICD-10-CM | POA: Diagnosis not present

## 2024-07-15 DIAGNOSIS — D352 Benign neoplasm of pituitary gland: Secondary | ICD-10-CM | POA: Insufficient documentation

## 2024-07-15 DIAGNOSIS — Z8049 Family history of malignant neoplasm of other genital organs: Secondary | ICD-10-CM | POA: Insufficient documentation

## 2024-07-15 DIAGNOSIS — C342 Malignant neoplasm of middle lobe, bronchus or lung: Secondary | ICD-10-CM

## 2024-07-15 DIAGNOSIS — K449 Diaphragmatic hernia without obstruction or gangrene: Secondary | ICD-10-CM | POA: Insufficient documentation

## 2024-07-15 DIAGNOSIS — H3530 Unspecified macular degeneration: Secondary | ICD-10-CM | POA: Diagnosis not present

## 2024-07-15 DIAGNOSIS — R197 Diarrhea, unspecified: Secondary | ICD-10-CM | POA: Insufficient documentation

## 2024-07-15 DIAGNOSIS — M810 Age-related osteoporosis without current pathological fracture: Secondary | ICD-10-CM | POA: Diagnosis not present

## 2024-07-15 DIAGNOSIS — Z79899 Other long term (current) drug therapy: Secondary | ICD-10-CM | POA: Insufficient documentation

## 2024-07-15 LAB — CBC WITH DIFFERENTIAL (CANCER CENTER ONLY)
Abs Immature Granulocytes: 0.01 K/uL (ref 0.00–0.07)
Basophils Absolute: 0 K/uL (ref 0.0–0.1)
Basophils Relative: 0 %
Eosinophils Absolute: 0.1 K/uL (ref 0.0–0.5)
Eosinophils Relative: 2 %
HCT: 34.6 % — ABNORMAL LOW (ref 36.0–46.0)
Hemoglobin: 11.6 g/dL — ABNORMAL LOW (ref 12.0–15.0)
Immature Granulocytes: 0 %
Lymphocytes Relative: 25 %
Lymphs Abs: 1.3 K/uL (ref 0.7–4.0)
MCH: 31.8 pg (ref 26.0–34.0)
MCHC: 33.5 g/dL (ref 30.0–36.0)
MCV: 94.8 fL (ref 80.0–100.0)
Monocytes Absolute: 0.4 K/uL (ref 0.1–1.0)
Monocytes Relative: 7 %
Neutro Abs: 3.4 K/uL (ref 1.7–7.7)
Neutrophils Relative %: 66 %
Platelet Count: 132 K/uL — ABNORMAL LOW (ref 150–400)
RBC: 3.65 MIL/uL — ABNORMAL LOW (ref 3.87–5.11)
RDW: 15.8 % — ABNORMAL HIGH (ref 11.5–15.5)
WBC Count: 5.2 K/uL (ref 4.0–10.5)
nRBC: 0 % (ref 0.0–0.2)

## 2024-07-15 LAB — TSH: TSH: 4.26 u[IU]/mL (ref 0.350–4.500)

## 2024-07-15 LAB — CMP (CANCER CENTER ONLY)
ALT: 16 U/L (ref 0–44)
AST: 19 U/L (ref 15–41)
Albumin: 4 g/dL (ref 3.5–5.0)
Alkaline Phosphatase: 78 U/L (ref 38–126)
Anion gap: 6 (ref 5–15)
BUN: 10 mg/dL (ref 8–23)
CO2: 27 mmol/L (ref 22–32)
Calcium: 10.2 mg/dL (ref 8.9–10.3)
Chloride: 108 mmol/L (ref 98–111)
Creatinine: 0.78 mg/dL (ref 0.44–1.00)
GFR, Estimated: >60 mL/min (ref >60)
Glucose, Bld: 87 mg/dL (ref 70–99)
Potassium: 4 mmol/L (ref 3.5–5.1)
Sodium: 141 mmol/L (ref 135–145)
Total Bilirubin: 0.4 mg/dL (ref 0.0–1.2)
Total Protein: 6.1 g/dL — ABNORMAL LOW (ref 6.5–8.1)

## 2024-07-15 LAB — MAGNESIUM: Magnesium: 2.1 mg/dL (ref 1.7–2.4)

## 2024-07-15 MED ORDER — MAGIC MOUTHWASH W/LIDOCAINE: 5.0000 mL | Freq: Three times a day (TID) | ORAL | 1 refills | Status: DC | PRN

## 2024-07-15 MED ORDER — B COMPLEX VITAMINS PO CAPS: 1.0000 | ORAL_CAPSULE | Freq: Every day | ORAL | Status: AC

## 2024-07-15 MED ORDER — NYSTATIN 100000 UNIT/ML MT SUSP
5.0000 mL | Freq: Three times a day (TID) | OROMUCOSAL | 1 refills | Status: DC
  Filled 2024-07-15 – 2024-07-16 (×2): qty 200, 14d supply, fill #0

## 2024-07-15 NOTE — Progress Notes (Signed)
 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   HISTORY OF PRESENTING ILLNESS: Audrey [name] is a wonderful 78 y.o. female who has been referred to us  by Ransom Other, MD for evaluation and management of M spike on UPEP and hypercalcemia.   Today she is accompanied by her husband and daughter-in-law, who help provide history. Patient reports that her PCP is Dr. Claudene. Patient reports that her calcium  has been elevated over the last couple of months.    She denies any lumps/bumps, fever, chills, night sweats, unexplained weight loss, or night sweats. Patient reports normal eating habits. Patient was noted to be hard of hearing.    She complains of generalized aching after activity present over the last couple of years. If she rests, her symptoms resolve. She denies any  localized discomfort to a particular area. She denies any spine pain with palpation.   Patient has had falls in the past and has had compression fractures in the spine with pain in those areas. No lytics lesions were found on imaging. Patient reports that she last received imaging of the back 6-12 months ago.    She reports that she cannot lay on her right side as pressure to her hip causes pain. This symptom has been present  for a while and is stable.    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 Patient denies any major inctions such as COVID-19 or flu.   Patient has no known kidney disease or autoimmune conditions such as RA or lupus. Her daughter-in-law r(Dr Kiyomi Pallo) reports that patient's son has Hoshimoto's thyroiditis and it is possible that  patient may also have it. Patient does take thyroid  medication.    Patient reports that she was recently diagnosed with Osteoporosis and has not yet started treatment for it. Her daughter-in-law reports that patient is currently in the prior authorization stage for Prolia. Patient did use Fosamax a while ago.    She complains of brain fog, balance issues, and mild dizziness. Patient has had balance issues for several years. She has tried  balance physical therapy in the past.    Patient reports that she is constantly cold.    She reports that she is currently in the beginning stages of macular degeneration.    She generally does not drink any water and does drink caffeine regularly.   SUMMARY OF ONCOLOGIC HISTORY: Oncology History   No history exists.   INTERVAL HISTORY: Audrey [name] is a 78 y.o. female who is here today for continued evaluation and management of non-small lung cancer; Adenocarcinoma; Positive for EGFR. Stage: T2aN0. accompanied by daughter and husband   she was last seen by me on 04/08/2024; at the time she did not have any concerns and was doing well.   Today, she says that her diarrhea has been more controlled with Tagrisso  40 mg daily, though this morning did have an episode of diarrhea, which she used Imodium to manage. Denies any new lumps/bumps, chest pain or SOB.  Her daughter say that it looks like she's lost more weight. Patient notes her goal was to weigh 135 LBS so that she could fit into older clothes and look better, so she  was intentionally trying to lose weight by eating more fruits and vegetables. She does note that she feels more fatigued, but always feels fatigued.   She says that she has noticed that her toenails have been blanching.   She mentions that her PCP wanted clearance for her to start Prolia for known Osteoporosis.   Mentions experiencing oral sores, today there is none reportedly, but they can onset suddenly. She is taking Vitamin-D.    Reports that she does not intend to update her Covid-19 vaccination - has not had any reactions to this in the past. Otherwise, she says that she is up to date on Flu, RSV, and PNA.  REVIEW OF SYSTEMS:   10 Point review of systems of done and is negative except as noted above.  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; Dr Joshua   Fracture    right 4th and 5th metacarpal   GERD (gastroesophageal reflux disease)    Hearing aid worn    B/L   History of hiatal hernia    HOH (hard of hearing)    Hyperlipidemia    Hypothyroidism    Osteopenia after menopause    Pneumonia    Uterine prolapse    Wears glasses     SURGICAL HISTORY Past Surgical History:  Procedure Laterality Date   CATARACT EXTRACTION Bilateral    COLONOSCOPY  09/05/2010   neg; [facility]   HEMORRHOID SURGERY     INTERCOSTAL NERVE BLOCK Right 10/12/2023   Procedure: INTERCOSTAL NERVE BLOCK;  Surgeon: Kerrin Elspeth BROCKS, MD;  Location: [facility] OR;  Service: Thoracic;  Laterality: Right;   NODE DISSECTION Right 10/12/2023   Procedure: NODE DISSECTION;  Surgeon: Kerrin Elspeth BROCKS, MD;  Location: [facility] OR;  Service: Thoracic;  Laterality: Right;   OPEN REDUCTION INTERNAL FIXATION (ORIF) METACARPAL Right 04/10/2020   Procedure: OPEN REDUCTION INTERNAL FIXATION (ORIF)  AS NECESSARY RIGHT FOURTH AND FIFTH METACARPAL FRACTURES;  Surgeon: Camella Fallow, MD;  Location: [facility] OR;  Service: Orthopedics;  Laterality: Right;  OPEN REDUCTION INTERNAL FIXATION (ORIF)  AS NECESSARY RIGHT FOURTH AND FIFTH METACARPAL FRACTURES    SOCIAL HISTORY Social History   Tobacco Use   Smoking status: Never   Smokeless tobacco: Never  Vaping Use   Vaping status: Never Used  Substance Use Topics   Alcohol  use: No    Alcohol /week: 0.0 standard drinks of alcohol    Drug use: No    Social History   Social History Narrative   Lives with spouse;   One son in  [place]   Dtr in [place]   2 grand children   2 twin girl; in [place] in July (10)    SOCIAL DRIVERS OF HEALTH SDOH Screenings   Food Insecurity: No Food Insecurity (10/14/2023)  Housing: Low Risk  (10/14/2023)  Transportation Needs: No Transportation Needs (10/14/2023)  Utilities: Not At Risk (10/14/2023)  Depression (PHQ2-9): Low Risk  (10/04/2018)  Financial Resource Strain: Low Risk  (07/12/2018)  Physical Activity: Inactive (07/12/2018)  Social Connections: Unknown (10/15/2023)  Stress: No Stress Concern Present (07/12/2018)  Tobacco Use: Low Risk  (02/20/2024)     FAMILY HISTORY Family History  Problem Relation Age of Onset   Heart attack Mother        in 37s   Uterine cancer Mother    Heart attack Father        in 64s   Coronary artery  disease Brother        stent in 20s   Other Brother        ? leukemia   Stroke Maternal Grandmother    Heart attack Maternal Grandfather        in 70s   Diabetes Neg Hx    Colon cancer Neg Hx    Esophageal cancer Neg Hx    Rectal cancer Neg Hx    Stomach cancer Neg Hx      ALLERGIES: is allergic to celecoxib and sulfonamide derivatives.  MEDICATIONS  Current Outpatient Medications  Medication Sig Dispense Refill   acetaminophen  (TYLENOL ) 500 MG tablet Take 1-2 tablets (500-1,000 mg total) by mouth every 6 (six) hours as needed.     acyclovir  (ZOVIRAX ) 400 MG tablet Take 1 tablet (400 mg total) by mouth 2 (two) times daily. 60 tablet 5   atorvastatin  (LIPITOR) 40 MG tablet Take 40 mg by mouth daily.     Cholecalciferol (VITAMIN D ) 50 MCG (2000 UT) tablet Take 2,000 Units by mouth daily.     FLUoxetine  (PROZAC ) 20 MG capsule Take 20 mg by mouth daily.     gabapentin  (NEURONTIN ) 300 MG capsule Take 1 tablet (300 mg) in the morning and afternoon.  Take 1 tablets (300 mg) at bedtime 120 capsule 3   guaiFENesin  (MUCINEX ) 600 MG 12 hr tablet Take 1 tablet (600 mg total) by mouth 2 (two) times daily as needed for to loosen phlegm or cough.      levothyroxine  (SYNTHROID , LEVOTHROID) 25 MCG tablet TAKE 1 TABLET BY MOUTH BEFORE BREAKFAST (Patient taking differently: Take 25 mcg by mouth daily before breakfast.) 90 tablet 3   Multiple Vitamins-Minerals (PRESERVISION AREDS) CAPS Take 1 capsule by mouth in the morning and at bedtime.     naproxen sodium (ALEVE) 220 MG tablet Take 220 mg by mouth daily as needed (pain).     nystatin-triamcinolone (MYCOLOG II) cream Apply 1 application topically daily as needed (Vaginal itcing).      ondansetron  (ZOFRAN ) 4 MG tablet Take 1 tablet (4 mg total) by mouth every 8 (eight) hours as needed for nausea or vomiting. 20 tablet 0   osimertinib  mesylate (TAGRISSO ) 40 MG tablet Take 1 tablet (40 mg total) by mouth daily. 30 tablet 5   pantoprazole  (PROTONIX ) 40 MG tablet Take 40 mg by mouth daily.     SYSTANE ULTRA 0.4-0.3 % SOLN Place 1 drop into both eyes in the morning and at bedtime.     No current facility-administered medications for this visit.    PHYSICAL EXAMINATION: ECOG PERFORMANCE STATUS: 1 - Symptomatic but completely ambulatory VITALS: Vitals:   07/15/24 0906  BP: 121/63  Pulse: 63  Resp: 20  Temp: 97.9 F (36.6 C)  SpO2: 99%   Filed Weights   07/15/24 0906  Weight: 137 lb 9.6 oz (62.4 kg)   Body mass index is 25.17 kg/m.  GENERAL: alert, in no acute distress and comfortable SKIN: no acute rashes, no significant lesions EYES: conjunctiva are pink and non-injected, sclera anicteric OROPHARYNX: MMM, no exudates, no oropharyngeal erythema or ulceration NECK: supple, no JVD LYMPH:  no palpable lymphadenopathy in the cervical, axillary or inguinal regions LUNGS: clear to auscultation b/l with normal respiratory effort HEART: regular rate & rhythm ABDOMEN:  normoactive bowel sounds , non tender, not distended, no hepatosplenomegaly Extremity: no pedal edema PSYCH: alert & oriented x 3 with fluent speech NEURO: no focal motor/sensory deficits  LABORATORY DATA:   I have  reviewed the  data as listed     Latest Ref Rng & Units 07/15/2024    8:46 AM 04/08/2024   11:26 AM 01/12/2024    8:58 AM  CBC EXTENDED  WBC 4.0 - 10.5 K/uL 5.2  4.6  4.3   RBC 3.87 - 5.11 MIL/uL 3.65  4.20  3.91   Hemoglobin 12.0 - 15.0 g/dL 88.3  87.6  88.7   HCT 36.0 - 46.0 % 34.6  37.9  34.1   Platelets 150 - 400 K/uL 132  153  153   NEUT# 1.7 - 7.7 K/uL 3.4  2.6  2.6   Lymph# 0.7 - 4.0 K/uL 1.3  1.3  0.9    TSH:  Lab Results  Component Value Date   TSH 4.260 07/15/2024       Latest Ref Rng & Units 07/15/2024    8:46 AM 04/08/2024   11:26 AM 01/12/2024    8:58 AM  VITAMINS & MINERALS  Magnesium 1.7 - 2.4 mg/dL 2.1  2.3  2.0        Latest Ref Rng & Units 04/08/2024   11:26 AM 01/12/2024    8:58 AM 11/27/2023   11:15 AM  CMP  Glucose 70 - 99 mg/dL 98  91  96   BUN 8 - 23 mg/dL 9  14  15    Creatinine 0.44 - 1.00 mg/dL 9.19  9.22  9.36   Sodium 135 - 145 mmol/L 140  140  141   Potassium 3.5 - 5.1 mmol/L 4.9  4.1  4.1   Chloride 98 - 111 mmol/L 107  108  108   CO2 22 - 32 mmol/L 27  26  26    Calcium  8.9 - 10.3 mg/dL 89.2  89.9  89.3   Total Protein 6.5 - 8.1 g/dL 7.2  6.8  7.0   Total Bilirubin 0.0 - 1.2 mg/dL 0.5  0.4  0.3   Alkaline Phos 38 - 126 U/L 82  73  98   AST 15 - 41 U/L 21  16  16    ALT 0 - 44 U/L 18  10  14     SURGICAL PATHOLOGY RESULT from 10/12/2023:  SURGICAL PATHOLOGY CASE: [id number] PATIENT: Crystallynn Pomales Surgical Pathology Report     Clinical History: right middle lobe lung mass (cf)     FINAL MICROSCOPIC DIAGNOSIS:  A. LYMPH NODE, LEVEL 12 #2, EXCISION:      One lymph node, negative for metastatic carcinoma (0/1).  B. LUNG, RIGHT UPPER LOBE WEDGE RESECTION:      Benign pulmonary parenchyma with hemorrhage and necrosis.      Negative for malignancy.  C. LYMPH NODE, LEVEL 12 #3, EXCISION:      One lymph node, negative for metastatic carcinoma (0/1).  D. LUNG, RIGHT MIDDLE LOBECTOMY:      Adenocarcinoma, acinar (90%), papillary  (5%) and micropapillary (5%) patterns.      Tumor size: 3.2 cm.      Lymphovascular invasion identified.      Vascular margin is positive for carcinoma (see final margins).      Bronchial margin is negative for carcinoma.      See oncology table.  E. LYMPH NODE, LEVEL 9, EXCISION:      One lymph node, negative for metastatic carcinoma (0/1).  F. LYMPH NODE, LEVEL 8, EXCISION:      One lymph node, negative for metastatic carcinoma (0/1).  G. LYMPH NODE, LEVEL 7, EXCISION:      One lymph node, negative for metastatic carcinoma (  0/1).  H. LYMPH NODE, LEVEL 10, EXCISION:      One lymph node, negative for metastatic carcinoma (0/1).  I. LYMPH NODE, LEVEL 7 #2, EXCISION:      One lymph node, negative for metastatic carcinoma (0/1). J. LYMPH NODE, LEVEL 10 #2, EXCISION:      One lymph node, negative for metastatic carcinoma (0/1).  K. LYMPH NODE, LEVEL 10 #3, EXCISION:      One lymph node, negative for metastatic carcinoma (0/1).  L. LYMPH NODE, LEVEL 12, EXCISION:      One lymph node, negative for metastatic carcinoma (0/1).  M. VASCULAR MARGIN #1, EXCISION:      Vascular tissue, negative for malignancy.  N. VASCULAR MARGIN #2, EXCISION:      Vascular tissue, negative for malignancy.  O. VASCULAR MARGIN #3, EXCISION:      Vascular tissue, negative for malignancy.  P. VASCULAR MARGIN #4, EXCISION:      Vascular tissue, negative for malignancy.  Q. VASCULAR MARGIN #5, EXCISION:      Vascular tissue, negative for malignancy.  R. VASCULAR MARGIN #6, EXCISION:      Vascular tissue, negative for malignancy.  ONCOLOGY TABLE:  LUNG: Resection  Synchronous Tumors: Not applicable Total Number of Primary Tumors: One Procedure: Lobectomy Specimen Laterality: Right Tumor Focality: Unifocal Tumor Site: Right middle lobe Tumor Size: 3.2 cm      Total Tumor Size: 3.2 cm      Invasive Tumor Size (applies only to invasive nonmucinous adenocarcinoma with a lepidic            component): 3.2 cm Histologic Type: Adenocarcinoma Visceral Pleura Invasion: Not identified Direct Invasion of Adjacent Structures: No adjacent structures present Lymphovascular Invasion: Identified Margins: All margins negative for invasive carcinoma      Closest Margin(s) to Invasive Carcinoma: Cannot be determined due to separated margin      Margin(s) Involved by Invasive Carcinoma: None      Margin Status for Non-Invasive Tumor: None Treatment Effect: No known presurgical therapy      Percentage of Residual Viable Tumor: NA Regional Lymph Nodes:      Number of Lymph Nodes Involved: 0                           Nodal Sites with Tumor: NA      Number of Lymph Nodes Examined: 10                      Nodal Sites Examined: Level 7, 8, 9, 10, 12 Distant Metastasis:      Distant Site(s) Involved: Not applicable Pathologic Stage Classification (pTNM, AJCC 8th Edition): pT2a, pN0 Ancillary Studies: Can be performed upon request Representative Tumor Block: D2 Comment(s): See comment ([id number])  COMMENT:  Immunohistochemical stains for CK AE1/AE3 and TTF-1 were performed on vascular margins (N, O, R and P). There are no definitive positive cells. The margin is negative for carcinoma. Selected slides were peer-reviewed by Dr. Reed who agrees with the interpretation on part N.  Controls worked appropriately.  INTRAOPERATIVE DIAGNOSIS:  A1. LYMPH NODE, LEVEL 12 #2, FROZEN SECTION:         Negative for malignancy.        Rapid intraoperative consult diagnosis called to Dr. Kerrin by Dr. Macie @ 1400 10/12/2023.  B1. LUNG, RIGHT UPPER LOBE NODULE, FROZEN SECTION:         Highly necrotic nodules with surrounding atypical pneumocyte proliferation.  Rapid intraoperative consult diagnosis called to Dr. Kerrin by Dr. Macie @ 1428 10/12/2023.  B2. LUNG, RIGHT UPPER LOBE MARGIN, FROZEN SECTION:         Negative for malignancy.        Rapid intraoperative consult  diagnosis called to Dr. Kerrin by Dr. Macie @ 1428 10/12/2023.  C1. LYMPH NODE, LEVEL 12 #3, FROZEN SECTION:         Negative for malignancy.        Rapid intraoperative consult diagnosis called to Dr. Kerrin by Dr. Macie @ 1432 10/12/2023.  D1. LUNG, RIGHT MIDDLE LOBE BRONCHIAL MARGIN, FROZEN SECTION:         Negative for carcinoma.        Rapid intraoperative consult diagnosis called to Dr. Kerrin by Dr. Macie @ 1531 10/12/2023.  D2. LUNG, RIGHT MIDDLE LOBE MASS, FROZEN SECTION:         Non-small cell carcinoma, favor adenocarcinoma.        Rapid intraoperative consult diagnosis called to Dr. Kerrin by Dr. Macie @ 1531 10/12/2023.  D3. LUNG, RIGHT MIDDLE LOBE VASCULAR MARGINS, FROZEN SECTION:         Positive for carcinoma.        Rapid intraoperative consult diagnosis called to Dr. Kerrin by Dr. Macie @ 1531 10/12/2023.  M1. VASCULAR MARGIN #1, FROZEN SECTION:       Negative for malignancy.      Rapid intraoperative consult diagnosis called to Dr. Kerrin by Dr. Macie @ 8367 10/12/2023.  N1. VASCULAR MARGIN #2, FROZEN SECTION:       Negative for malignancy.      Rapid intraoperative consult diagnosis called to Dr. Kerrin by Dr. Macie @ 8367 10/12/2023.  O1. VASCULAR MARGIN #3, FROZEN SECTION:       Atypical cells.      Rapid intraoperative consult diagnosis called to Dr. Kerrin by Dr. Macie @ 8367 10/12/2023.  P1. VASCULAR MARGIN #4, FROZEN SECTION:       Negative for malignancy.      Rapid intraoperative consult diagnosis called to Dr. Kerrin by Dr. Macie @ 8367 10/12/2023.  Q. VASCULAR MARGIN #5:       No tissue available.      Rapid intraoperative consult diagnosis called to Dr. Kerrin by Dr. Macie @ 8367 10/12/2023.  R1. VASCULAR MARGIN #6, FROZEN SECTION:       Negative for malignancy.      Rapid intraoperative consult diagnosis called to Dr. Kerrin by Dr. Macie @ 8367 10/12/2023.   GROSS DESCRIPTION:  Specimen A: Received fresh for  rapid intraoperative consult evaluation by frozen section and labeled level 12 node #2 is a 0.5 x 0.4 x 0.3 cm anthracotic soft tissue, submitted in 1 block for frozen section.  Specimen B: Received fresh for rapid intraoperative consult evaluations of nodule and margin by frozen section is an 8 g, 5.7 x 3 x 2.6 cm wedge of lung tissue clinically identified as right upper lobe wedge.  The pleura is tan-pink to pink-purple with slight anthracosis, is smooth, and has 2 staple lines, 1 with a short suture identifying false margin and 1 with long suture identifying true margin.  On sectioning, there is a 0.8 cm dark red ill-defined nodule and adjacent 0.6 cm similar nodule. The nodules are 1.7 cm from the true margin. Block summary: Block 1 = section of nodules, submitted for frozen section Block 2 = parenchyma adjacent to true margin, submitted for frozen section Block 3 = remaining nodules,  submitted for routine histology  Specimen C: Received fresh for rapid intraoperative consult evaluation by frozen section and labeled level 12 node #3 is a 1.4 x 1 x 0.4 cm anthracotic soft tissue which is submitted in 1 block for frozen section.  Specimen D: Received fresh for rapid intraoperative consult evaluations of bronchial margin, mass and vascular margins by frozen section is a 58 g, 9.2 x 5 x 3.9 cm portion of lung clinically identified as right middle lobe.  Pleura is tan-pink to dark red-purple with scattered adhesions.  Found directly beneath the hilum is a 3.2 x 3 x 2.5 cm tan-white firm ill-defined mass which comes within 0.5 cm of the bronchial margin and is grossly adjacent to the vascular margins. Uninvolved parenchyma is tan-pink to dark red, spongy.  No separately identified lymph nodes are found. Block summary: Block 1 = bronchial margin, submitted for frozen section Block 2 = section of mass, submitted for frozen section Block 3 = vascular margins, submitted for  frozen section Blocks 4-6 = sections of mass submitted for routine histology  Specimen E: Received fresh labeled level 9 node is a 0.7 x 0.4 x 0.3 cm aggregate of yellow-pink to anthracotic soft tissue, submitted 1 block.  Specimen F: Received fresh labeled level 8 node is a 0.9 x 0.8 x 0.3 cm yellow to anthracotic soft tissue, submitted in 1 block.  Specimen G: Received fresh labeled level 7 node is a 0.8 x 0.6 x 0.3 cm aggregate of anthracotic soft tissue, submitted in 1 block.  Specimen H: Received fresh labeled level 10 node is a 0.5 x 0.4 x 0.2 cm yellow to anthracotic soft tissue, submitted in 1 block.  Specimen I: Received fresh labeled level 7 node #2 is a 1.8 x 1.6 x 0.4 cm aggregate of yellow-red to anthracotic soft tissue, submitted 1 block.  Specimen J: Received fresh labeled level 10 node #2 is a 0.7 x 0.5 x 0.3 cm anthracotic soft tissue, submitted 1 block.  Specimen K: Received fresh labeled level 10 node #3 is a 0.7 cm soft anthracotic nodule, in toto, one block.  Specimen L: Received fresh labeled level 12 node is a 0.7 x 0.7 x 0.3 cm aggregate of yellow-red to anthracotic soft tissue, submitted in 1 block.  Specimen M: Received fresh for rapid intraoperative consult evaluation by frozen section and labeled vascular margin #1 is a minute gray-white soft tissue, submitted in 1 block for frozen section.  Specimen N: Received fresh for rapid intraoperative consult evaluation by frozen section and labeled vascular margin #2 is a minute gray-white soft tissue, submitted in 1 block for frozen section.  Specimen O: Received fresh for rapid intraoperative consult evaluation by frozen section and labeled vascular margin #3 is a 0.2 cm gray-white soft tissue, submitted in 1 block for frozen section.  Specimen P: Received fresh for rapid intraoperative consult evaluation by frozen section and labeled vascular margin #4 is a 0.2 cm gray-white  soft tissue, submitted in 1 block for frozen section.  Specimen Q: Received labeled vascular margin #5 is a container of saline with no grossly identifiable tissue.  Specimen R: Received fresh for rapid intraoperative consult evaluation by frozen section and labeled vascular margin #6 is a 0.8 x 0.7 x 0.25 cm gray-white soft tissue with several embedded staples and a suture on new margin.  A section of tissue at the new margin is submitted in 1 block for frozen section.  SW 10/13/2023  RADIOGRAPHIC STUDIES: I have personally reviewed the radiological images as listed and agreed with the findings in the report. No results found.   ASSESSMENT & PLAN:  78 y.o. 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   Consistent with Light chain MGUS  Myeloma FISH panel with no notable genetic mutation. Plan - Patient has no clinical symptoms suggestive of progression for plasma cell dyscrasia. - Labs done today are currently pending     2.  Lung adenocarcinoma stage T2aN0: Discussed surgical pathology result from 10/12/2023 in detail with the patient. Showed right lung: Adenocarcinoma, acinar (90%), papillary (5%) and micropapillary (5%) patterns.       Tumor size: 3.2 cm.       Lymphovascular invasion identified.       Vascular margin is positive for carcinoma (see final margins).       Bronchial margin is negative for carcinoma.  -Discussed Molecular pathology results from 10/12/2023 in detail with the patient.  Positive for EGFR mutation. Negative for HRD signature.  -Educated the patient about Adenocarcinoma. PLAN - Patient has no clinical concerns for new pulmonary symptoms. - CBC and CMP are stable. -EKG shows no QT prolongation. - CT chest without contrast done recently showed no evidence of new lung nodules or signs of lung cancer progression. - Patient is tolerating the Tagrisso  at 40 mg p.o. daily better than she was at 80 mg.  Her GI symptoms  have been much more manageable with only grade 1 diarrhea. - She has not needed Imodium but was counseled that she could take this if needed. - She has had some weight loss but that appears to be voluntarily related to changes in her diet. - Her thyroid  functions were checked today and are within normal limits.  She will continue on her current dose of Synthroid  with her PCP. - We will plan to repeat CT chest without contrast in 6 months.   3.  Hypercalcemia calcium  10.7 but corrected calcium  within normal limits.   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  Hiatal hernia with GERD -Follow-up with PCP  PLAN: - Discussed lab results on 07/15/2024 in detail with patient: CBC showed WBC of 5.2K, Hemoglobin of 11.6 decreased from 12.3, and PLTs of 132K decreased from 153K. CMP with Calcium  10.7 increased from 10.0.  Magnesium 2.1. Independently reviewed TSH: 4.260.  - Recommend dental clearance and informed her of the risk of dental work while taking Prolia.  After starting Prolia, suggest taking Vitamin-D and Calcium .   - Informed her that nutritional deficiencies or fungal infection could contribute to toenail blanching.   - Discussed that it's not necessary for her to lose any more weight so that her energy levels are more stable and to prevent fragility.   - Suggested starting Vitamin B12 to combat any nutritional deficiencies that may contribute to slower healing.    Will prescribe Nystatin per patient request for pain relief.    - Vaccine counseling provided: discussed age related guidelines for Covid-19.  FOLLOW-UP  CT chest wo contrast in 12 weeks RTC with dr Onesimo with labs in 14 weeks  The total time spent in the appointment was *** minutes* .  All of the patient's questions were answered and the patient knows to call the clinic with any problems, questions, or concerns.  Emaline Onesimo MD MS AAHIVMS [facility] Hematology/Oncology Physician [facility]  *Total Encounter Time as defined by the Centers for  Medicare and Medicaid Services includes, in addition to the face-to-face time of a patient visit (documented in the note above) non-face-to-face time: obtaining and reviewing outside history, ordering and reviewing medications, tests or procedures, care coordination (communications with other health care professionals or caregivers) and documentation in the medical record.  I,Emily Lagle,acting as a neurosurgeon for Emaline Saran, MD.,have documented all relevant documentation on the behalf of Emaline Saran, MD,as directed by  Emaline Saran, MD while in the presence of Emaline Saran, MD.  I have reviewed the above documentation for accuracy and completeness, and I agree with the above.  Deontaye Civello, MD

## 2024-07-16 ENCOUNTER — Other Ambulatory Visit (HOSPITAL_COMMUNITY): Payer: Self-pay

## 2024-07-16 LAB — KAPPA/LAMBDA LIGHT CHAINS
Kappa free light chain: 163.8 mg/L — ABNORMAL HIGH (ref 3.3–19.4)
Kappa, lambda light chain ratio: 10.64 — ABNORMAL HIGH (ref 0.26–1.65)
Lambda free light chains: 15.4 mg/L (ref 5.7–26.3)

## 2024-07-16 MED ORDER — STERILE WATER FOR IRRIGATION IR SOLN
5.0000 mL | Freq: Three times a day (TID) | 1 refills | Status: AC
  Filled 2024-07-16 – 2024-07-22 (×3): qty 200, 14d supply, fill #0

## 2024-07-18 ENCOUNTER — Other Ambulatory Visit: Payer: Self-pay

## 2024-07-18 ENCOUNTER — Encounter: Payer: Self-pay | Admitting: Hematology

## 2024-07-18 ENCOUNTER — Other Ambulatory Visit (HOSPITAL_COMMUNITY): Payer: Self-pay

## 2024-07-18 MED ORDER — MAGIC MOUTHWASH W/LIDOCAINE: 5.0000 mL | Freq: Three times a day (TID) | ORAL | 1 refills | Status: AC | PRN

## 2024-07-19 ENCOUNTER — Other Ambulatory Visit: Payer: Self-pay | Admitting: Hematology

## 2024-07-19 ENCOUNTER — Other Ambulatory Visit: Payer: Self-pay

## 2024-07-19 ENCOUNTER — Encounter (INDEPENDENT_AMBULATORY_CARE_PROVIDER_SITE_OTHER): Payer: Self-pay

## 2024-07-19 ENCOUNTER — Other Ambulatory Visit (HOSPITAL_COMMUNITY): Payer: Self-pay

## 2024-07-19 DIAGNOSIS — C342 Malignant neoplasm of middle lobe, bronchus or lung: Secondary | ICD-10-CM

## 2024-07-19 LAB — MULTIPLE MYELOMA PANEL, SERUM
Albumin SerPl Elph-Mcnc: 3.5 g/dL (ref 2.9–4.4)
Albumin/Glob SerPl: 1.3 (ref 0.7–1.7)
Alpha 1: 0.3 g/dL (ref 0.0–0.4)
Alpha2 Glob SerPl Elph-Mcnc: 0.7 g/dL (ref 0.4–1.0)
B-Globulin SerPl Elph-Mcnc: 1 g/dL (ref 0.7–1.3)
Gamma Glob SerPl Elph-Mcnc: 0.8 g/dL (ref 0.4–1.8)
Globulin, Total: 2.7 g/dL (ref 2.2–3.9)
IgA: 74 mg/dL (ref 64–422)
IgG (Immunoglobin G), Serum: 825 mg/dL (ref 586–1602)
IgM (Immunoglobulin M), Srm: 42 mg/dL (ref 26–217)
Total Protein ELP: 6.2 g/dL (ref 6.0–8.5)

## 2024-07-19 MED ORDER — OSIMERTINIB MESYLATE 40 MG PO TABS
40.0000 mg | ORAL_TABLET | Freq: Every day | ORAL | 5 refills | Status: AC
  Filled 2024-07-19: qty 30, 30d supply, fill #0
  Filled 2024-08-20: qty 30, 30d supply, fill #1
  Filled 2024-09-13: qty 30, 30d supply, fill #2
  Filled 2024-10-11: qty 30, 30d supply, fill #3

## 2024-07-19 NOTE — Progress Notes (Signed)
 Specialty Pharmacy Refill Coordination Note  MyChart Questionnaire Submission  Audrey [name] is a 78 y.o. female contacted today regarding refills of specialty medication(s) Tagrisso .  Doses on hand: (Patient-Rptd) 14   Patient requested: (Patient-Rptd) Pickup at [facility] at [facility] date: 07/22/24  Medication will be filled on 07/22/24.  This fill date is pending response to refill request from provider. Patient is aware and if they have not received fill by intended date, they must follow up with pharmacy.

## 2024-07-20 ENCOUNTER — Other Ambulatory Visit (HOSPITAL_COMMUNITY): Payer: Self-pay

## 2024-07-22 ENCOUNTER — Other Ambulatory Visit: Payer: Self-pay

## 2024-07-22 ENCOUNTER — Other Ambulatory Visit (HOSPITAL_COMMUNITY): Payer: Self-pay

## 2024-07-22 NOTE — Progress Notes (Signed)
 Clinical Intervention Note  Clinical Intervention Notes: Patient reported starting a Vitamin B Complex. No DDIs identified with Tagrisso    Clinical Intervention Outcomes: Prevention of an adverse drug event   Advertising Account Planner

## 2024-08-06 ENCOUNTER — Other Ambulatory Visit: Payer: Self-pay

## 2024-08-06 NOTE — Progress Notes (Signed)
 Specialty Pharmacy Ongoing Clinical Assessment Note  Audrey [name] is a 78 y.o. female who is being followed by the specialty pharmacy service for RxSp Oncology   Patient's specialty medication(s) reviewed today: Osimertinib  Mesylate (TAGRISSO )   Missed doses in the last 4 weeks: 0   Patient/Caregiver did not have any additional questions or concerns.   Therapeutic benefit summary: Patient is achieving benefit   Adverse events/side effects summary: Experienced adverse events/side effects (occasional, tolerable diarrhea, has Imodium on hand to use PRN)   Patient's therapy is appropriate to: Continue    Goals Addressed             This Visit's Progress    Maintain optimal adherence to therapy   On track    Patient is initiating therapy. Patient will maintain adherence         Follow up: 3 months  Silvano LOISE [name] Specialty Pharmacist

## 2024-08-20 ENCOUNTER — Other Ambulatory Visit (HOSPITAL_COMMUNITY): Payer: Self-pay

## 2024-08-20 ENCOUNTER — Other Ambulatory Visit: Payer: Self-pay | Admitting: Pharmacist

## 2024-08-20 ENCOUNTER — Other Ambulatory Visit: Payer: Self-pay

## 2024-08-20 NOTE — Progress Notes (Signed)
 Specialty Pharmacy Refill Coordination Note  Audrey [name] is a 78 y.o. female contacted today regarding refills of specialty medication(s) Osimertinib  Mesylate (TAGRISSO )   Patient requested (Patient-Rptd) Pickup at [facility] at [facility] date: (Patient-Rptd) 08/23/24   Medication will be filled on: 08/22/24

## 2024-08-20 NOTE — Progress Notes (Signed)
 Encounter opened in error.

## 2024-09-13 ENCOUNTER — Other Ambulatory Visit: Payer: Self-pay

## 2024-09-16 ENCOUNTER — Other Ambulatory Visit: Payer: Self-pay

## 2024-09-16 ENCOUNTER — Other Ambulatory Visit (HOSPITAL_COMMUNITY): Payer: Self-pay

## 2024-09-16 NOTE — Progress Notes (Signed)
 Clinical Intervention Note  Clinical Intervention Notes: Patient reported being diagnosed with a bladder infection and starting course of cephalexin. No DDIs identified with Tagrisso    Clinical Intervention Outcomes: Prevention of an adverse drug event   Advertising Account Planner

## 2024-09-16 NOTE — Progress Notes (Signed)
 Specialty Pharmacy Refill Coordination Note  MyChart Questionnaire Submission  Audrey [name] is a 79 y.o. female contacted today regarding refills of specialty medication(s) Tagrisso .  Doses on hand: 18 on 1/9  Patient requested: Pickup at [facility] date: 09/18/24  Medication will be filled on 09/17/24

## 2024-09-17 ENCOUNTER — Other Ambulatory Visit: Payer: Self-pay

## 2024-09-26 ENCOUNTER — Other Ambulatory Visit: Payer: Self-pay | Admitting: Hematology

## 2024-09-26 DIAGNOSIS — D472 Monoclonal gammopathy: Secondary | ICD-10-CM

## 2024-10-07 ENCOUNTER — Ambulatory Visit (HOSPITAL_COMMUNITY)
Admission: RE | Admit: 2024-10-07 | Discharge: 2024-10-07 | Disposition: A | Discharge status: Home / Self Care | Source: Ambulatory Visit | Attending: Hematology | Admitting: Hematology

## 2024-10-07 DIAGNOSIS — C342 Malignant neoplasm of middle lobe, bronchus or lung: Secondary | ICD-10-CM

## 2024-10-11 ENCOUNTER — Other Ambulatory Visit (HOSPITAL_COMMUNITY): Payer: Self-pay

## 2024-10-21 ENCOUNTER — Inpatient Hospital Stay: Attending: Hematology

## 2024-10-21 ENCOUNTER — Inpatient Hospital Stay: Admitting: Hematology

## 2025-03-24 ENCOUNTER — Ambulatory Visit: Admitting: Internal Medicine
